# Patient Record
Sex: Female | Born: 1950
Health system: Southern US, Community
[De-identification: ages and names within clinical notes are randomized; demographics above are authoritative.]

## PROBLEM LIST (undated history)

## (undated) DIAGNOSIS — R112 Nausea with vomiting, unspecified: Secondary | ICD-10-CM

## (undated) DIAGNOSIS — T8859XA Other complications of anesthesia, initial encounter: Secondary | ICD-10-CM

## (undated) DIAGNOSIS — Z8489 Family history of other specified conditions: Secondary | ICD-10-CM

## (undated) DIAGNOSIS — F419 Anxiety disorder, unspecified: Secondary | ICD-10-CM

## (undated) DIAGNOSIS — K900 Celiac disease: Secondary | ICD-10-CM

## (undated) DIAGNOSIS — F40240 Claustrophobia: Secondary | ICD-10-CM

## (undated) DIAGNOSIS — E7800 Pure hypercholesterolemia, unspecified: Secondary | ICD-10-CM

## (undated) DIAGNOSIS — T4145XA Adverse effect of unspecified anesthetic, initial encounter: Secondary | ICD-10-CM

## (undated) DIAGNOSIS — E039 Hypothyroidism, unspecified: Secondary | ICD-10-CM

## (undated) DIAGNOSIS — G35 Multiple sclerosis: Secondary | ICD-10-CM

## (undated) DIAGNOSIS — G4730 Sleep apnea, unspecified: Secondary | ICD-10-CM

## (undated) DIAGNOSIS — G35D Multiple sclerosis, unspecified: Secondary | ICD-10-CM

## (undated) DIAGNOSIS — Z9889 Other specified postprocedural states: Secondary | ICD-10-CM

## (undated) DIAGNOSIS — N879 Dysplasia of cervix uteri, unspecified: Secondary | ICD-10-CM

## (undated) DIAGNOSIS — H9190 Unspecified hearing loss, unspecified ear: Secondary | ICD-10-CM

## (undated) DIAGNOSIS — F329 Major depressive disorder, single episode, unspecified: Secondary | ICD-10-CM

## (undated) DIAGNOSIS — K219 Gastro-esophageal reflux disease without esophagitis: Secondary | ICD-10-CM

## (undated) DIAGNOSIS — F32A Depression, unspecified: Secondary | ICD-10-CM

## (undated) DIAGNOSIS — H539 Unspecified visual disturbance: Secondary | ICD-10-CM

## (undated) HISTORY — DX: Unspecified hearing loss, unspecified ear: H91.90

## (undated) HISTORY — DX: Multiple sclerosis: G35

## (undated) HISTORY — PX: COLPOSCOPY: SHX161

## (undated) HISTORY — PX: ROTATOR CUFF REPAIR: SHX139

## (undated) HISTORY — PX: FINGER SURGERY: SHX640

## (undated) HISTORY — PX: COLONOSCOPY: SHX174

## (undated) HISTORY — PX: VITRECTOMY: SHX106

## (undated) HISTORY — PX: LAPAROSCOPY: SHX197

## (undated) HISTORY — PX: OOPHORECTOMY: SHX86

## (undated) HISTORY — DX: Unspecified visual disturbance: H53.9

## (undated) HISTORY — DX: Dysplasia of cervix uteri, unspecified: N87.9

## (undated) HISTORY — DX: Celiac disease: K90.0

## (undated) HISTORY — DX: Multiple sclerosis, unspecified: G35.D

## (undated) HISTORY — DX: Hypothyroidism, unspecified: E03.9

## (undated) HISTORY — PX: CHOLECYSTECTOMY: SHX55

---

## 1981-08-20 HISTORY — PX: CHOLECYSTECTOMY: SHX55

## 1984-08-20 HISTORY — PX: LAPAROSCOPY: SHX197

## 1985-08-20 HISTORY — PX: GYNECOLOGIC CRYOSURGERY: SHX857

## 1997-08-20 HISTORY — PX: ABDOMINAL HYSTERECTOMY: SHX81

## 1998-01-19 ENCOUNTER — Inpatient Hospital Stay (HOSPITAL_COMMUNITY): Admission: RE | Admit: 1998-01-19 | Discharge: 1998-01-21 | Payer: Self-pay | Admitting: Obstetrics and Gynecology

## 1998-11-02 ENCOUNTER — Other Ambulatory Visit: Admission: RE | Admit: 1998-11-02 | Discharge: 1998-11-02 | Payer: Self-pay | Admitting: Obstetrics and Gynecology

## 1998-12-07 ENCOUNTER — Emergency Department (HOSPITAL_COMMUNITY): Admission: EM | Admit: 1998-12-07 | Discharge: 1998-12-07 | Payer: Self-pay | Admitting: Emergency Medicine

## 1998-12-07 ENCOUNTER — Encounter: Payer: Self-pay | Admitting: Emergency Medicine

## 1999-09-30 ENCOUNTER — Emergency Department (HOSPITAL_COMMUNITY): Admission: EM | Admit: 1999-09-30 | Discharge: 1999-09-30 | Payer: Self-pay | Admitting: Emergency Medicine

## 1999-10-01 ENCOUNTER — Encounter: Payer: Self-pay | Admitting: Emergency Medicine

## 1999-12-07 ENCOUNTER — Other Ambulatory Visit: Admission: RE | Admit: 1999-12-07 | Discharge: 1999-12-07 | Payer: Self-pay | Admitting: Obstetrics and Gynecology

## 2001-01-03 ENCOUNTER — Other Ambulatory Visit: Admission: RE | Admit: 2001-01-03 | Discharge: 2001-01-03 | Payer: Self-pay | Admitting: Obstetrics and Gynecology

## 2001-12-10 ENCOUNTER — Other Ambulatory Visit: Admission: RE | Admit: 2001-12-10 | Discharge: 2001-12-10 | Payer: Self-pay | Admitting: Obstetrics and Gynecology

## 2002-12-16 ENCOUNTER — Other Ambulatory Visit: Admission: RE | Admit: 2002-12-16 | Discharge: 2002-12-16 | Payer: Self-pay | Admitting: Obstetrics and Gynecology

## 2003-12-20 ENCOUNTER — Other Ambulatory Visit: Admission: RE | Admit: 2003-12-20 | Discharge: 2003-12-20 | Payer: Self-pay | Admitting: Obstetrics and Gynecology

## 2004-04-26 ENCOUNTER — Encounter: Admission: RE | Admit: 2004-04-26 | Discharge: 2004-04-26 | Payer: Self-pay | Admitting: Obstetrics and Gynecology

## 2004-12-21 ENCOUNTER — Other Ambulatory Visit: Admission: RE | Admit: 2004-12-21 | Discharge: 2004-12-21 | Payer: Self-pay | Admitting: Obstetrics and Gynecology

## 2005-06-12 ENCOUNTER — Encounter: Admission: RE | Admit: 2005-06-12 | Discharge: 2005-06-12 | Payer: Self-pay | Admitting: Obstetrics and Gynecology

## 2006-01-17 ENCOUNTER — Other Ambulatory Visit: Admission: RE | Admit: 2006-01-17 | Discharge: 2006-01-17 | Payer: Self-pay | Admitting: Obstetrics and Gynecology

## 2007-01-29 ENCOUNTER — Other Ambulatory Visit: Admission: RE | Admit: 2007-01-29 | Discharge: 2007-01-29 | Payer: Self-pay | Admitting: Obstetrics and Gynecology

## 2007-11-04 ENCOUNTER — Ambulatory Visit (HOSPITAL_BASED_OUTPATIENT_CLINIC_OR_DEPARTMENT_OTHER): Admission: RE | Admit: 2007-11-04 | Discharge: 2007-11-04 | Payer: Self-pay | Admitting: Orthopaedic Surgery

## 2010-06-04 ENCOUNTER — Ambulatory Visit: Payer: Self-pay | Admitting: Diagnostic Radiology

## 2010-06-04 ENCOUNTER — Emergency Department (HOSPITAL_BASED_OUTPATIENT_CLINIC_OR_DEPARTMENT_OTHER): Admission: EM | Admit: 2010-06-04 | Discharge: 2010-06-04 | Payer: Self-pay | Admitting: Emergency Medicine

## 2010-08-18 ENCOUNTER — Ambulatory Visit: Payer: Self-pay | Admitting: Gynecology

## 2010-10-05 ENCOUNTER — Other Ambulatory Visit: Payer: Self-pay | Admitting: Obstetrics and Gynecology

## 2010-10-05 ENCOUNTER — Other Ambulatory Visit (HOSPITAL_COMMUNITY)
Admission: RE | Admit: 2010-10-05 | Discharge: 2010-10-05 | Disposition: A | Discharge status: Home / Self Care | Payer: Medicare Other | Source: Ambulatory Visit | Attending: Obstetrics and Gynecology | Admitting: Obstetrics and Gynecology

## 2010-10-05 ENCOUNTER — Encounter (INDEPENDENT_AMBULATORY_CARE_PROVIDER_SITE_OTHER): Payer: Medicare Other | Admitting: Obstetrics and Gynecology

## 2010-10-05 DIAGNOSIS — R350 Frequency of micturition: Secondary | ICD-10-CM

## 2010-10-05 DIAGNOSIS — Z124 Encounter for screening for malignant neoplasm of cervix: Secondary | ICD-10-CM

## 2010-10-05 DIAGNOSIS — N952 Postmenopausal atrophic vaginitis: Secondary | ICD-10-CM

## 2010-10-05 DIAGNOSIS — R82998 Other abnormal findings in urine: Secondary | ICD-10-CM

## 2010-10-05 DIAGNOSIS — N951 Menopausal and female climacteric states: Secondary | ICD-10-CM

## 2010-11-26 ENCOUNTER — Inpatient Hospital Stay (INDEPENDENT_AMBULATORY_CARE_PROVIDER_SITE_OTHER)
Admission: RE | Admit: 2010-11-26 | Discharge: 2010-11-26 | Disposition: A | Discharge status: Home / Self Care | Payer: Medicare Other | Source: Ambulatory Visit | Attending: Emergency Medicine | Admitting: Emergency Medicine

## 2010-11-26 DIAGNOSIS — H6120 Impacted cerumen, unspecified ear: Secondary | ICD-10-CM

## 2010-11-26 DIAGNOSIS — J029 Acute pharyngitis, unspecified: Secondary | ICD-10-CM

## 2010-11-26 LAB — POCT RAPID STREP A (OFFICE): Streptococcus, Group A Screen (Direct): NEGATIVE

## 2011-01-02 NOTE — Op Note (Signed)
NAMESHADAVIA, DAMPIER              ACCOUNT NO.:  [id number]   MEDICAL RECORD NO.:  [id number]          PATIENT TYPE:  AMB   LOCATION:  [facility]                          FACILITY:  [facility]   PHYSICIAN:  Monico Blitz. Dalldorf, M.D.DATE OF BIRTH:  [birth year]   DATE OF PROCEDURE:  11/04/2007  DATE OF DISCHARGE:                               OPERATIVE REPORT   PREOPERATIVE DIAGNOSES:  1. Right shoulder impingement.  2. Right shoulder partial rotator cuff tear.  3. Right shoulder SLAP.   POSTOPERATIVE DIAGNOSES:  1. Right shoulder impingement.  2. Right shoulder partial rotator cuff tear.  3. Right shoulder SLAP.   PROCEDURE:  1. Right shoulder arthroscopic acromioplasty.  2. Right shoulder arthroscopic partial claviculectomy.  3. Right shoulder arthroscopic debridement.   ANESTHESIA:  General and block.   ATTENDING SURGEON:  Monico Blitz. Rhona Raider, M.D.   ASSISTANT:  Roselee Nova, P.A.   INDICATIONS FOR PROCEDURE:  The patient is a 60 year old woman with many  months of right shoulder pain.  She did respond in a transient fashion  to a subacromial injection, with 2 weeks of relief.  She has not  responded to an exercise program and has been left with pain at rest and  pain trying to use her arm.  By MRI scan she has a significant partial-  thickness tear and things consistent with impingement.  She also appears  to have a small SLAP injury.  She is offered an arthroscopy.  Informed  operative consent was obtained, after the discussion of the possible  complications of reaction to anesthesia and infection.   SUMMARY FINDINGS AND PROCEDURE:  Under general anesthesia and a block,  an arthroscopy of the right shoulder was performed.  The glenohumeral  joint showed no degenerative changes, and the biceps tendon appeared  intact.  Labral structures were intact, except for a slight injury to  the superior labrum.  This was technically a type 2, but the labrum was  not subluxed into the joint.   I elected to perform a debridement of the  superior aspect of the glenoid without any suturing.  She did have a  partial-thickness rotator cuff tear, seen from below.  A debridement was  done.  In the subacromial space she had bursitis and a thorough  bursectomy that was done, but no full-thickness tear was seen.  She had  a prominent subacromial morphology, addressed with an acromioplasty; and  also had a prominence of the distal clavicle, addressed with a partial  claviculectomy.   DESCRIPTION OF PROCEDURE:  The patient went to the operating suite,  where a general anesthetic was applied without difficulty.  She was also  given a block in the preanesthesia area.  She was positioned in beach-  chair position and prepped and draped in normal sterile fashion.  After  the administration of IV Kefzol, an arthroscopy to the right shoulder  was performed through a total of 3 portals.  Findings were as noted  above.  The procedure consisted of the debridement of the flap, with  abrasion of the superior aspect of the glenoid.  We then followed  this  with a debridement of the synovial surface partial-thickness cuff tear.  This did not appear to constitute more than about 10 or 20% of the cuff.  In the subacromial space we performed an acromioplasty, with the bur in  the lateral position; followed by transfer of the bur to the posterior  position.  I also performed a resection of the undersurface of the  distal clavicle.  The cuff was thoroughly inspected from above, and no  significant tear was seen here.  The shoulder was thoroughly irrigated,  followed by placement of Marcaine.  Simple sutures of nylon was used to  loosely reapproximate the portals, followed by dry gauze and tape.  Estimated blood loss and fluids obtained from anesthesia records.   DISPOSITION:  The patient was extubated in the operating room and then  taken to the recovery room in stable condition.   PLANS:  Plans are for  her to go home the same day and follow up in the  office in one week.  We will monitor in the recovery room, and if we  need to keep her overnight that will certainly be fine.  If she does go  home, I will contact her by phone tonight.      Monico Blitz Rhona Raider, M.D.  Electronically Signed     PGD/MEDQ  D:  11/04/2007  T:  11/04/2007  Job:  [id number]

## 2011-05-04 ENCOUNTER — Emergency Department (HOSPITAL_COMMUNITY)
Admission: EM | Admit: 2011-05-04 | Discharge: 2011-05-04 | Disposition: A | Discharge status: Home / Self Care | Payer: Medicare Other | Attending: Emergency Medicine | Admitting: Emergency Medicine

## 2011-05-04 ENCOUNTER — Emergency Department (HOSPITAL_COMMUNITY): Payer: Medicare Other

## 2011-05-04 DIAGNOSIS — R05 Cough: Secondary | ICD-10-CM | POA: Insufficient documentation

## 2011-05-04 DIAGNOSIS — R059 Cough, unspecified: Secondary | ICD-10-CM | POA: Insufficient documentation

## 2011-05-04 DIAGNOSIS — R6883 Chills (without fever): Secondary | ICD-10-CM | POA: Insufficient documentation

## 2011-05-04 DIAGNOSIS — Z7982 Long term (current) use of aspirin: Secondary | ICD-10-CM | POA: Insufficient documentation

## 2011-05-04 DIAGNOSIS — E039 Hypothyroidism, unspecified: Secondary | ICD-10-CM | POA: Insufficient documentation

## 2011-05-04 DIAGNOSIS — R1013 Epigastric pain: Secondary | ICD-10-CM | POA: Insufficient documentation

## 2011-05-04 DIAGNOSIS — Z79899 Other long term (current) drug therapy: Secondary | ICD-10-CM | POA: Insufficient documentation

## 2011-05-04 DIAGNOSIS — N390 Urinary tract infection, site not specified: Secondary | ICD-10-CM | POA: Insufficient documentation

## 2011-05-04 DIAGNOSIS — G35 Multiple sclerosis: Secondary | ICD-10-CM | POA: Insufficient documentation

## 2011-05-04 DIAGNOSIS — E7800 Pure hypercholesterolemia, unspecified: Secondary | ICD-10-CM | POA: Insufficient documentation

## 2011-05-04 DIAGNOSIS — F341 Dysthymic disorder: Secondary | ICD-10-CM | POA: Insufficient documentation

## 2011-05-04 LAB — URINALYSIS, ROUTINE W REFLEX MICROSCOPIC
Glucose, UA: NEGATIVE mg/dL
Protein, ur: NEGATIVE mg/dL
pH: 5.5 (ref 5.0–8.0)

## 2011-05-04 LAB — COMPREHENSIVE METABOLIC PANEL
ALT: 31 U/L (ref 0–35)
AST: 18 U/L (ref 0–37)
Albumin: 3.9 g/dL (ref 3.5–5.2)
CO2: 28 mEq/L (ref 19–32)
Calcium: 9.2 mg/dL (ref 8.4–10.5)
Chloride: 100 mEq/L (ref 96–112)
GFR calc non Af Amer: >60 mL/min (ref >60)
Sodium: 136 mEq/L (ref 135–145)

## 2011-05-04 LAB — DIFFERENTIAL
Lymphocytes Relative: 35 % (ref 12–46)
Lymphs Abs: 2 10*3/uL (ref 0.7–4.0)
Monocytes Relative: 10 % (ref 3–12)
Neutrophils Relative %: 54 % (ref 43–77)

## 2011-05-04 LAB — URINE MICROSCOPIC-ADD ON

## 2011-05-04 LAB — CBC
HCT: 37.3 % (ref 36.0–46.0)
Hemoglobin: 12.9 g/dL (ref 12.0–15.0)
MCH: 29.1 pg (ref 26.0–34.0)
MCV: 84.2 fL (ref 78.0–100.0)
Platelets: 265 10*3/uL (ref 150–400)
RBC: 4.43 MIL/uL (ref 3.87–5.11)
WBC: 5.8 10*3/uL (ref 4.0–10.5)

## 2011-05-04 LAB — CK TOTAL AND CKMB (NOT AT ARMC): Relative Index: INVALID (ref 0.0–2.5)

## 2011-05-11 ENCOUNTER — Other Ambulatory Visit: Payer: Self-pay | Admitting: Gastroenterology

## 2011-06-11 ENCOUNTER — Encounter: Payer: Self-pay | Admitting: Gynecology

## 2011-06-11 ENCOUNTER — Ambulatory Visit (INDEPENDENT_AMBULATORY_CARE_PROVIDER_SITE_OTHER): Payer: Medicare Other | Admitting: Gynecology

## 2011-06-11 VITALS — BP 120/80

## 2011-06-11 DIAGNOSIS — G35 Multiple sclerosis: Secondary | ICD-10-CM | POA: Insufficient documentation

## 2011-06-11 DIAGNOSIS — E039 Hypothyroidism, unspecified: Secondary | ICD-10-CM | POA: Insufficient documentation

## 2011-06-11 DIAGNOSIS — B373 Candidiasis of vulva and vagina: Secondary | ICD-10-CM

## 2011-06-11 DIAGNOSIS — M549 Dorsalgia, unspecified: Secondary | ICD-10-CM

## 2011-06-11 DIAGNOSIS — N898 Other specified noninflammatory disorders of vagina: Secondary | ICD-10-CM

## 2011-06-11 DIAGNOSIS — R102 Pelvic and perineal pain: Secondary | ICD-10-CM

## 2011-06-11 DIAGNOSIS — N949 Unspecified condition associated with female genital organs and menstrual cycle: Secondary | ICD-10-CM

## 2011-06-11 DIAGNOSIS — N809 Endometriosis, unspecified: Secondary | ICD-10-CM | POA: Insufficient documentation

## 2011-06-11 MED ORDER — FLUCONAZOLE 150 MG PO TABS: 150.0000 mg | ORAL_TABLET | Freq: Once | ORAL | Status: AC

## 2011-06-11 MED ORDER — CYCLOBENZAPRINE HCL 10 MG PO TABS: 10.0000 mg | ORAL_TABLET | Freq: Three times a day (TID) | ORAL | Status: DC | PRN

## 2011-06-11 NOTE — Progress Notes (Signed)
Patient presents with 2 issues. First is a vaginal discharge with odor notes that over the last week or so. Second issue is some right lower quadrant discomfort first started in the back and radiated around to the front and now has some radiation down her leg. No nausea vomiting diarrhea constipation no urinary symptoms such as frequency dysuria blood or abnormal urine color. No constipation diarrhea. She just recently 5 weeks ago had an endoscopy colonoscopy which was reportedly normal. She is awaiting a call from a urine culture that was done per Dr. Harley Hallmark office in follow up of being treated for UTI previously. She is status post TAH/BSO number of years ago for endometriosis pelvic pain has not been on ERT.  Exam Spine straight without CVA tenderness Abdomen soft nontender without masses guarding rebound organomegaly Pelvic external BUS vagina with moderate atrophic changes white discharge noted KOH wet prep done, bimanual without masses or tenderness, rectovaginal exam is normal  Assessment and plan 1. White discharge KOH wet prep is positive for yeast. She has been on antibiotics for UTI recently we'll cover with Diflucan 150x1 dose. 2. Right-sided pelvic pain. Differential to include GI, urinary musculoskeletal, disc reviewed. Given her TAH/BSO history and not having any issues up until now I doubt GYN related. We'll start with ultrasound just to clear pelvis assuming negative, since she had a recent colonoscopy not showing evidence of diverticulitis or other pathology, I would recommend following up with orthopedic rule out low back disease such as disc. If her urine culture is positive and she is treated with antibiotics and her symptoms resolved we will follow. I did prescribe Flexeril 10 mg #20 one by mouth at bedtime up to every 8 hours to see if this doesn't help or if it is musculoskeletal. Patient will follow up for her ultrasound and we'll go from there.

## 2011-06-18 ENCOUNTER — Encounter: Payer: Self-pay | Admitting: Gynecology

## 2011-06-18 ENCOUNTER — Ambulatory Visit (INDEPENDENT_AMBULATORY_CARE_PROVIDER_SITE_OTHER): Payer: Medicare Other

## 2011-06-18 ENCOUNTER — Ambulatory Visit (INDEPENDENT_AMBULATORY_CARE_PROVIDER_SITE_OTHER): Payer: Medicare Other | Admitting: Gynecology

## 2011-06-18 DIAGNOSIS — R102 Pelvic and perineal pain: Secondary | ICD-10-CM

## 2011-06-18 DIAGNOSIS — R1031 Right lower quadrant pain: Secondary | ICD-10-CM

## 2011-06-18 DIAGNOSIS — N949 Unspecified condition associated with female genital organs and menstrual cycle: Secondary | ICD-10-CM

## 2011-06-18 DIAGNOSIS — N898 Other specified noninflammatory disorders of vagina: Secondary | ICD-10-CM

## 2011-06-18 MED ORDER — TERCONAZOLE 0.8 % VA CREA: 1.0000 | TOPICAL_CREAM | Freq: Every day | VAGINAL | Status: AC

## 2011-06-18 NOTE — Progress Notes (Signed)
Patient also for ultrasound due to right sided pain. Ultrasound is negative with the exception of some prominent bowel activity. Review this with her husband. As discussed with her prior appointment I think this sounds more orthopedic although she never tried the Flexeril concurs to go ahead and try this to see if it does relieve her discomfort. She has an appointment with Dr. Cristina Gong in follow up of her colonoscopy that was done a month ago. I asked her to review her symptoms with him to see if he would think anything further from a study standpoint needs to be done but otherwise she can follow up with orthopedics. She does note that her vaginal symptoms seem to gotten better with the single Diflucan but still having some odor and a little bit of discharge. Recommended Terazol 3 day cream I prescribed this for her. Follow up if symptoms persist or recur.

## 2011-06-22 ENCOUNTER — Inpatient Hospital Stay (INDEPENDENT_AMBULATORY_CARE_PROVIDER_SITE_OTHER)
Admission: RE | Admit: 2011-06-22 | Discharge: 2011-06-22 | Disposition: A | Discharge status: Home / Self Care | Payer: Medicare Other | Source: Ambulatory Visit | Attending: Family Medicine | Admitting: Family Medicine

## 2011-06-22 DIAGNOSIS — K649 Unspecified hemorrhoids: Secondary | ICD-10-CM

## 2011-06-26 ENCOUNTER — Other Ambulatory Visit: Payer: Self-pay | Admitting: Gastroenterology

## 2011-06-26 ENCOUNTER — Encounter: Payer: Self-pay | Admitting: Obstetrics and Gynecology

## 2011-06-26 DIAGNOSIS — R1031 Right lower quadrant pain: Secondary | ICD-10-CM

## 2011-06-29 ENCOUNTER — Ambulatory Visit
Admission: RE | Admit: 2011-06-29 | Discharge: 2011-06-29 | Disposition: A | Discharge status: Home / Self Care | Payer: Medicare Other | Source: Ambulatory Visit | Attending: Gastroenterology | Admitting: Gastroenterology

## 2011-06-29 DIAGNOSIS — R1031 Right lower quadrant pain: Secondary | ICD-10-CM

## 2011-06-29 MED ORDER — IOHEXOL 300 MG/ML  SOLN
100.0000 mL | Freq: Once | INTRAMUSCULAR | Status: AC | PRN
Start: 2011-06-29 — End: 2011-06-29
  Administered 2011-06-29: 100 mL via INTRAVENOUS

## 2011-10-11 DIAGNOSIS — R209 Unspecified disturbances of skin sensation: Secondary | ICD-10-CM | POA: Diagnosis not present

## 2011-10-11 DIAGNOSIS — Z79899 Other long term (current) drug therapy: Secondary | ICD-10-CM | POA: Diagnosis not present

## 2011-10-11 DIAGNOSIS — G35 Multiple sclerosis: Secondary | ICD-10-CM | POA: Diagnosis not present

## 2011-10-11 DIAGNOSIS — G4700 Insomnia, unspecified: Secondary | ICD-10-CM | POA: Diagnosis not present

## 2011-10-15 DIAGNOSIS — Z111 Encounter for screening for respiratory tuberculosis: Secondary | ICD-10-CM | POA: Diagnosis not present

## 2011-10-30 ENCOUNTER — Encounter: Payer: Self-pay | Admitting: Obstetrics and Gynecology

## 2011-10-30 ENCOUNTER — Ambulatory Visit (INDEPENDENT_AMBULATORY_CARE_PROVIDER_SITE_OTHER): Payer: Medicare Other | Admitting: Obstetrics and Gynecology

## 2011-10-30 VITALS — BP 130/72 | Ht 63.0 in | Wt 188.0 lb

## 2011-10-30 DIAGNOSIS — N390 Urinary tract infection, site not specified: Secondary | ICD-10-CM | POA: Diagnosis not present

## 2011-10-30 DIAGNOSIS — N879 Dysplasia of cervix uteri, unspecified: Secondary | ICD-10-CM | POA: Insufficient documentation

## 2011-10-30 DIAGNOSIS — K900 Celiac disease: Secondary | ICD-10-CM | POA: Insufficient documentation

## 2011-10-30 NOTE — Progress Notes (Addendum)
The patient came back today for further followup. She continues to have loss of urine with coughing laughing and sneezing. She has done Kegel exercises. The neurologist who takes care of her multiple sclerosis thought was related to that. She does not have dysuria, frequency, urgency, or hematuria. Her hot flashes have significantly subsided. She has atrophic vaginitis but is asymptomatic. She is not sexually active. She is having no vaginal bleeding. She is having no pelvic pain. She has scheduled her  mammogram for Friday. She does her lab through PCP. She has had 2 normal bone densities. She is status post TAH/BSO for endometriosis.  ROS: 12 system  review done. Patient has multiple sclerosis with current symptoms blurred vision normal optic neuritis and paresthesias of left arm. Patient's neurologist is switching her medication. Pertinent positives listed above. Other positives include celiac disease.  HEENT: Within normal limits. Leslie Ryan present Neck: No masses. Supraclavicular lymph nodes: Not enlarged. Breasts: Examined in both sitting and lying position. Symmetrical without skin changes or masses. Abdomen: Soft no masses guarding or rebound. No hernias. Pelvic: External within normal limits. BUS within normal limits. Vaginal examination shows poor estrogen effect, no cystocele enterocele or rectocele. Cervix and uterus absent. Adnexa within normal limits. Rectovaginal confirmatory.  Assessment: Urinary incontinence-stress versus neurological bladder. Atrophic vaginitis. Menopausal symptoms. Multiple sclerosis.  Plan: Discussed urodynamics with the urologist to see if this is stress incontinence rather than neurological bladder. Patient declined at the moment as she would not consider surgery and is not that much of a problem. Mammogram. No treatment necessary for menopausal symptoms. I told the patient since she is status post TAH/BSO, on no medication that I give her, more than 20 years  since dysplasia of the cervix that I did not think she needs to see me regularly wellness exams. She will continue with yearly mammograms. She will return whenever she has a problem. Extremities within normal limits.

## 2011-10-31 LAB — URINALYSIS W MICROSCOPIC + REFLEX CULTURE
Bilirubin Urine: NEGATIVE
Casts: NONE SEEN
Crystals: NONE SEEN
Glucose, UA: NEGATIVE mg/dL
Leukocytes, UA: NEGATIVE
Specific Gravity, Urine: 1.011 (ref 1.005–1.030)
Squamous Epithelial / LPF: NONE SEEN
pH: 6 (ref 5.0–8.0)

## 2011-11-02 DIAGNOSIS — Z1231 Encounter for screening mammogram for malignant neoplasm of breast: Secondary | ICD-10-CM | POA: Diagnosis not present

## 2011-11-05 ENCOUNTER — Encounter: Payer: Self-pay | Admitting: Obstetrics and Gynecology

## 2011-12-06 DIAGNOSIS — H519 Unspecified disorder of binocular movement: Secondary | ICD-10-CM | POA: Diagnosis not present

## 2011-12-06 DIAGNOSIS — H538 Other visual disturbances: Secondary | ICD-10-CM | POA: Diagnosis not present

## 2011-12-06 DIAGNOSIS — H5200 Hypermetropia, unspecified eye: Secondary | ICD-10-CM | POA: Diagnosis not present

## 2011-12-13 DIAGNOSIS — E785 Hyperlipidemia, unspecified: Secondary | ICD-10-CM | POA: Diagnosis not present

## 2011-12-13 DIAGNOSIS — Z0000 Encounter for general adult medical examination without abnormal findings: Secondary | ICD-10-CM | POA: Diagnosis not present

## 2011-12-13 DIAGNOSIS — Z23 Encounter for immunization: Secondary | ICD-10-CM | POA: Diagnosis not present

## 2011-12-13 DIAGNOSIS — E049 Nontoxic goiter, unspecified: Secondary | ICD-10-CM | POA: Diagnosis not present

## 2011-12-13 DIAGNOSIS — G35 Multiple sclerosis: Secondary | ICD-10-CM | POA: Diagnosis not present

## 2011-12-17 ENCOUNTER — Emergency Department (HOSPITAL_COMMUNITY)
Admission: EM | Admit: 2011-12-17 | Discharge: 2011-12-17 | Disposition: A | Discharge status: Home / Self Care | Payer: Medicare Other | Attending: Emergency Medicine | Admitting: Emergency Medicine

## 2011-12-17 ENCOUNTER — Emergency Department (HOSPITAL_COMMUNITY): Payer: Medicare Other

## 2011-12-17 ENCOUNTER — Encounter (HOSPITAL_COMMUNITY): Payer: Self-pay | Admitting: Neurology

## 2011-12-17 DIAGNOSIS — Z7982 Long term (current) use of aspirin: Secondary | ICD-10-CM | POA: Insufficient documentation

## 2011-12-17 DIAGNOSIS — R209 Unspecified disturbances of skin sensation: Secondary | ICD-10-CM | POA: Diagnosis not present

## 2011-12-17 DIAGNOSIS — Z79899 Other long term (current) drug therapy: Secondary | ICD-10-CM | POA: Insufficient documentation

## 2011-12-17 DIAGNOSIS — R197 Diarrhea, unspecified: Secondary | ICD-10-CM | POA: Insufficient documentation

## 2011-12-17 DIAGNOSIS — G35 Multiple sclerosis: Secondary | ICD-10-CM | POA: Insufficient documentation

## 2011-12-17 DIAGNOSIS — R6889 Other general symptoms and signs: Secondary | ICD-10-CM | POA: Diagnosis not present

## 2011-12-17 DIAGNOSIS — R42 Dizziness and giddiness: Secondary | ICD-10-CM | POA: Diagnosis not present

## 2011-12-17 DIAGNOSIS — G319 Degenerative disease of nervous system, unspecified: Secondary | ICD-10-CM | POA: Diagnosis not present

## 2011-12-17 DIAGNOSIS — K900 Celiac disease: Secondary | ICD-10-CM | POA: Insufficient documentation

## 2011-12-17 DIAGNOSIS — R51 Headache: Secondary | ICD-10-CM | POA: Insufficient documentation

## 2011-12-17 DIAGNOSIS — E039 Hypothyroidism, unspecified: Secondary | ICD-10-CM | POA: Diagnosis not present

## 2011-12-17 LAB — POCT I-STAT, CHEM 8
BUN: 11 mg/dL (ref 6–23)
Chloride: 104 mEq/L (ref 96–112)
Creatinine, Ser: 0.7 mg/dL (ref 0.50–1.10)
Potassium: 4.1 mEq/L (ref 3.5–5.1)
Sodium: 139 mEq/L (ref 135–145)

## 2011-12-17 MED ORDER — IBUPROFEN 800 MG PO TABS
800.0000 mg | ORAL_TABLET | Freq: Once | ORAL | Status: AC
  Administered 2011-12-17: 800 mg via ORAL
  Filled 2011-12-17: qty 1

## 2011-12-17 MED ORDER — GADOBENATE DIMEGLUMINE 529 MG/ML IV SOLN
20.0000 mL | Freq: Once | INTRAVENOUS | Status: AC | PRN
  Administered 2011-12-17: 20 mL via INTRAVENOUS

## 2011-12-17 MED ORDER — ONDANSETRON HCL 4 MG/2ML IJ SOLN
INTRAMUSCULAR | Status: AC
  Filled 2011-12-17: qty 2

## 2011-12-17 MED ORDER — LORAZEPAM 2 MG/ML IJ SOLN
1.0000 mg | Freq: Once | INTRAMUSCULAR | Status: AC
  Administered 2011-12-17: 1 mg via INTRAVENOUS
  Filled 2011-12-17: qty 1

## 2011-12-17 NOTE — Discharge Instructions (Signed)
Call Dr Garth Bigness office in the morning as we discussed. Take the MRI disc with you to your visit. Return to the ER for further evaluation if you have any new or worsening symptoms.     Dizziness Dizziness is a common problem. It is a feeling of unsteadiness or lightheadedness. You may feel like you are about to faint. Dizziness can lead to injury if you stumble or fall. A person of any age group can suffer from dizziness, but dizziness is more common in older adults. CAUSES  Dizziness can be caused by many different things, including:  Middle ear problems.   Standing for too long.   Infections.   An allergic reaction.   Aging.   An emotional response to something, such as the sight of blood.   Side effects of medicines.   Fatigue.   Problems with circulation or blood pressure.   Excess use of alcohol, medicines, or illegal drug use.   Breathing too fast (hyperventilation).   An arrhythmia or problems with your heart rhythm.   Low red blood cell count (anemia).   Pregnancy.   Vomiting, diarrhea, fever, or other illnesses that cause dehydration.   Diseases or conditions such as Parkinson's disease, high blood pressure (hypertension), diabetes, and thyroid problems.   Exposure to extreme heat.  DIAGNOSIS  To find the cause of your dizziness, your caregiver may do a physical exam, lab tests, radiologic imaging scans, or an electrocardiography test (ECG).  TREATMENT  Treatment of dizziness depends on the cause of your symptoms and can vary greatly. HOME CARE INSTRUCTIONS   Drink enough fluids to keep your urine clear or pale yellow. This is especially important in very hot weather. In the elderly, it is also important in cold weather.   If your dizziness is caused by medicines, take them exactly as directed. When taking blood pressure medicines, it is especially important to get up slowly.   Rise slowly from chairs and steady yourself until you feel okay.   In the  morning, first sit up on the side of the bed. When this seems okay, stand slowly while holding onto something until you know your balance is fine.   If you need to stand in one place for a long time, be sure to move your legs often. Tighten and relax the muscles in your legs while standing.   If dizziness continues to be a problem, have someone stay with you for a day or two. Do this until you feel you are well enough to stay alone. Have the person call your caregiver if he or she notices changes in you that are concerning.   Do not drive or use heavy machinery if you feel dizzy.  SEEK IMMEDIATE MEDICAL CARE IF:   Your dizziness or lightheadedness gets worse.   You feel nauseous or vomit.   You develop problems with talking, walking, weakness, or using your arms, hands, or legs.   You are not thinking clearly or you have difficulty forming sentences. It may take a friend or family member to determine if your thinking is normal.   You develop chest pain, abdominal pain, shortness of breath, or sweating.   Your vision changes.   You notice any bleeding.   You have side effects from medicine that seems to be getting worse rather than better.  MAKE SURE YOU:   Understand these instructions.   Will watch your condition.   Will get help right away if you are not doing well or  get worse.  Document Released: 01/30/2001 Document Revised: 07/26/2011 Document Reviewed: 02/23/2011 [facility] Patient Information 2012 [place].

## 2011-12-17 NOTE — ED Provider Notes (Signed)
Pt with hx MS and internuclear ophthalmoplegia, to CDU to await completion of MRI study of the brain. Pt care shared with Dr Winfred Leeds. Please see his note for full HPI and initial physical examination. On arrival to CDU, pt c/o mild right-sided HA. Continued subjective left hand numbness, very slight dizziness. Denies visual change from baseline at this time.  Nausea has resolved.    On exam, pt is awake, alert, oriented, NAD. PERRL. Visual fields full to confrontation bilaterally. EOMI with no nystagmus. Lungs CTAB. Heart RRR. Abd soft/NT/ND. Nml bowel sounds. MAEW with 5/5 bilateral grip strength. Ambulatory with steady gait in ED.    MRI results reviewed, no acute findings. This has been discussed with the patient. I spoke with the on-call neurologist for pt primary neurologist, Dr Felecia Shelling in [facility], who advises that they can get the patient in to the office tomorrow for close follow-up. She will bring a disc of her MRI images to the appointment. Pt is comfortable with this follow-up plan and feels ready for d/c home. Return precautions were discussed.  Orlie Dakin, [place] 12/17/11 1843

## 2011-12-17 NOTE — ED Provider Notes (Signed)
Medical screening examination/treatment/procedure(s) were conducted as a shared visit with non-physician practitioner(s) and myself.  I personally evaluated the patient during the encounter  Orlie Dakin, MD 12/17/11 2140

## 2011-12-17 NOTE — ED Notes (Signed)
Pt to be given 1 mg ativan before MRI due to anxiety.

## 2011-12-17 NOTE — ED Provider Notes (Signed)
History     CSN: [id number]  Arrival date & time 12/17/11  1406   First MD Initiated Contact with Patient 12/17/11 1429      Chief Complaint  Patient presents with  . Dizziness    (Consider location/radiation/quality/duration/timing/severity/associated sxs/prior treatment) HPI Complaint of dizziness, sudden onset 12:15 PM today feeling of lightheadedness and room spinning accompanied by numbness in left hand symptoms much improved since arrival here no treatment prior to coming here. Patient also had one episode of diarrhea. She denies any abdominal pain denies chest pain denies shortness of breath. Admits to left-sided temporal headaches intermittent over the past 2 weeks. Has mild headache presently.. patient had similar episodes of "dizziness" in the past when she had internuclear ophthalmoplegia which was the first exacerbation of her multiple sclerosis Past Medical History  Diagnosis Date  . Multiple sclerosis   . Endometriosis   . Hypothyroidism   . Cervical dysplasia   . Celiac disease     Past Surgical History  Procedure Date  . Gynecologic cryosurgery 1987  . Cholecystectomy   . Rotator cuff repair   . Finger surgery   . Abdominal hysterectomy 1999    TAH,BSO menorrhgia,,endometriosis  . Colposcopy   . Oophorectomy     Family History  Problem Relation Age of Onset  . Breast cancer Mother     age 58's  . Cancer Mother     UTERINE  . Diabetes Father   . Hypertension Father   . Heart disease Father   . Cancer Maternal Grandfather     Stomach cancer  . Heart disease Paternal Grandfather     History  Substance Use Topics  . Smoking status: Former Research scientist (life sciences)  . Smokeless tobacco: Not on file  . Alcohol Use: No    OB History    Grav Para Term Preterm Abortions TAB SAB Ect Mult Living   0               Review of Systems  Constitutional: Negative.   Respiratory: Negative.   Cardiovascular: Negative.   Gastrointestinal: Positive for diarrhea.    Musculoskeletal: Negative.   Skin: Negative.   Neurological: Positive for dizziness, numbness and headaches.  Hematological: Negative.   Psychiatric/Behavioral: Negative.     Allergies  Erythromycin  Home Medications   Current Outpatient Rx  Name Route Sig Dispense Refill  . ASPIRIN 81 MG PO TABS Oral Take 81 mg by mouth daily.    Marland Kitchen VITAMIN D-3 5000 UNITS PO TABS Oral Take 1 tablet by mouth daily.     Marland Kitchen FLUOXETINE HCL 20 MG PO CAPS Oral Take 20 mg by mouth daily.      . INTERFERON BETA-1A 30 MCG/0.5ML IM KIT Intramuscular Inject 30 mcg into the muscle every 7 (seven) days.      Marland Kitchen LANSOPRAZOLE 30 MG PO CPDR Oral Take 30 mg by mouth daily.      Marland Kitchen LEVOTHYROXINE SODIUM 112 MCG PO TABS Oral Take 112 mcg by mouth daily.      Marland Kitchen PRAVASTATIN SODIUM 40 MG PO TABS Oral Take 40 mg by mouth daily.      Marland Kitchen VITAMIN E 400 UNITS PO CAPS Oral Take 400 Units by mouth daily.        BP 126/67  Pulse 83  Temp(Src) 98.1 F (36.7 C) (Oral)  Resp 10  SpO2 96%  LMP 08/20/1997  Physical Exam  Nursing note and vitals reviewed. Constitutional: She appears well-developed and well-nourished.  HENT:  Head: Normocephalic and  atraumatic.  Eyes: Conjunctivae are normal. Pupils are equal, round, and reactive to light.  Neck: Neck supple. No tracheal deviation present. No thyromegaly present.  Cardiovascular: Normal rate and regular rhythm.   No murmur heard. Pulmonary/Chest: Effort normal and breath sounds normal.  Abdominal: Soft. Bowel sounds are normal. She exhibits no distension. There is no tenderness.  Musculoskeletal: Normal range of motion. She exhibits no edema and no tenderness.       Gait normal  Neurological: She is alert. Coordination normal.       Become slightly vertiginous upon sitting up from supine position Romberg normal pronator drift normal,  Skin: Skin is warm and dry. No rash noted.  Psychiatric: She has a normal mood and affect.    Date: 12/17/2011  Rate: 75  Rhythm: normal  sinus rhythm  QRS Axis: normal  Intervals: normal  ST/T Wave abnormalities: normal  Conduction Disutrbances: none  Narrative Interpretation: unremarkable  Unchanged from 05/04/11  ED Course  Procedures (including critical care time)  Labs Reviewed - No data to display No results found.   No diagnosis found.   Results for orders placed during the hospital encounter of 12/17/11  POCT I-STAT, CHEM 8      Component Value Range   Sodium 139  135 - 145 (mEq/L)   Potassium 4.1  3.5 - 5.1 (mEq/L)   Chloride 104  96 - 112 (mEq/L)   BUN 11  6 - 23 (mg/dL)   Creatinine, Ser 0.70  0.50 - 1.10 (mg/dL)   Glucose, Bld 112 (*) 70 - 99 (mg/dL)   Calcium, Ion 1.17  1.12 - 1.32 (mmol/L)   TCO2 26  0 - 100 (mmol/L)   Hemoglobin 13.9  12.0 - 15.0 (g/dL)   HCT 41.0  36.0 - 46.0 (%)   Mr Jeri Cos Wo Contrast  12/17/2011  *RADIOLOGY REPORT*  Clinical Data: Also a history multiple sclerosis.  Acute onset of dizziness.  MRI HEAD WITHOUT AND WITH CONTRAST  Technique:  Multiplanar, multiecho pulse sequences of the brain and surrounding structures were obtained according to standard protocol without and with intravenous contrast  Contrast: 22m MULTIHANCE GADOBENATE DIMEGLUMINE 529 MG/ML IV SOLN  Comparison: None.  Findings: No acute infarct seen separate from the areas of demyelination.  No intracranial hemorrhage.  Moderate number of demyelinating plaques most notable periventricular region slightly greater on the left.  None of these areas demonstrates restricted motion or enhancement as may be seen with areas of active demyelination.  Mild global atrophy without hydrocephalus.  Major intracranial vascular structures are patent.  No intracranial mass or abnormal enhancement.  IMPRESSION: No acute infarct seen separate from the areas of demyelination.  Moderate number of demyelinating plaques most notable periventricular region slightly greater on the left.  None of these areas demonstrates restricted motion or  enhancement as may be seen with areas of active demyelination.  Mild global atrophy without hydrocephalus.  Original Report Authenticated By: SDoug Sou M.D.    MDM  Plan MRI scan to rule out demyelinating disease Neurologic followup Diagnoses vertigo        SOrlie Dakin MD 12/17/11 15686

## 2011-12-17 NOTE — ED Notes (Signed)
MRI contacted to have  A CD made for PT to take home .

## 2011-12-17 NOTE — ED Notes (Signed)
Per ems- Pt was at cook out, became dizzy, went home had "explosive diarrhea". Pt remained dizzy, pt also c/o epigastric discomfort. 146/88, HR 85, 99%. NAD. Speaking in clear, concise sentences. Denying any CP. EKG unremarkable SR.

## 2011-12-18 DIAGNOSIS — G35 Multiple sclerosis: Secondary | ICD-10-CM | POA: Diagnosis not present

## 2011-12-18 DIAGNOSIS — R51 Headache: Secondary | ICD-10-CM | POA: Diagnosis not present

## 2011-12-18 DIAGNOSIS — R42 Dizziness and giddiness: Secondary | ICD-10-CM | POA: Diagnosis not present

## 2011-12-18 DIAGNOSIS — G4700 Insomnia, unspecified: Secondary | ICD-10-CM | POA: Diagnosis not present

## 2012-02-14 DIAGNOSIS — H8110 Benign paroxysmal vertigo, unspecified ear: Secondary | ICD-10-CM | POA: Diagnosis not present

## 2012-02-14 DIAGNOSIS — G35 Multiple sclerosis: Secondary | ICD-10-CM | POA: Diagnosis not present

## 2012-04-25 DIAGNOSIS — K219 Gastro-esophageal reflux disease without esophagitis: Secondary | ICD-10-CM | POA: Diagnosis not present

## 2012-04-25 DIAGNOSIS — R05 Cough: Secondary | ICD-10-CM | POA: Diagnosis not present

## 2012-05-01 DIAGNOSIS — R42 Dizziness and giddiness: Secondary | ICD-10-CM | POA: Diagnosis not present

## 2012-05-08 DIAGNOSIS — G35 Multiple sclerosis: Secondary | ICD-10-CM | POA: Diagnosis not present

## 2012-05-08 DIAGNOSIS — R5383 Other fatigue: Secondary | ICD-10-CM | POA: Diagnosis not present

## 2012-05-08 DIAGNOSIS — R209 Unspecified disturbances of skin sensation: Secondary | ICD-10-CM | POA: Diagnosis not present

## 2012-05-08 DIAGNOSIS — R42 Dizziness and giddiness: Secondary | ICD-10-CM | POA: Diagnosis not present

## 2012-05-08 DIAGNOSIS — R5381 Other malaise: Secondary | ICD-10-CM | POA: Diagnosis not present

## 2012-05-26 DIAGNOSIS — Z23 Encounter for immunization: Secondary | ICD-10-CM | POA: Diagnosis not present

## 2012-07-28 DIAGNOSIS — R195 Other fecal abnormalities: Secondary | ICD-10-CM | POA: Diagnosis not present

## 2012-07-28 DIAGNOSIS — R1013 Epigastric pain: Secondary | ICD-10-CM | POA: Diagnosis not present

## 2012-07-29 ENCOUNTER — Other Ambulatory Visit: Payer: Self-pay | Admitting: Gastroenterology

## 2012-07-29 DIAGNOSIS — K900 Celiac disease: Secondary | ICD-10-CM | POA: Diagnosis not present

## 2012-07-29 DIAGNOSIS — D131 Benign neoplasm of stomach: Secondary | ICD-10-CM | POA: Diagnosis not present

## 2012-07-29 DIAGNOSIS — R1013 Epigastric pain: Secondary | ICD-10-CM | POA: Diagnosis not present

## 2012-07-29 DIAGNOSIS — D1330 Benign neoplasm of unspecified part of small intestine: Secondary | ICD-10-CM | POA: Diagnosis not present

## 2012-08-12 DIAGNOSIS — R195 Other fecal abnormalities: Secondary | ICD-10-CM | POA: Diagnosis not present

## 2012-10-09 DIAGNOSIS — R42 Dizziness and giddiness: Secondary | ICD-10-CM | POA: Diagnosis not present

## 2012-10-09 DIAGNOSIS — R5383 Other fatigue: Secondary | ICD-10-CM | POA: Diagnosis not present

## 2012-10-09 DIAGNOSIS — G35 Multiple sclerosis: Secondary | ICD-10-CM | POA: Diagnosis not present

## 2012-10-09 DIAGNOSIS — G4700 Insomnia, unspecified: Secondary | ICD-10-CM | POA: Diagnosis not present

## 2012-10-09 DIAGNOSIS — R5381 Other malaise: Secondary | ICD-10-CM | POA: Diagnosis not present

## 2012-11-04 ENCOUNTER — Encounter: Payer: Medicare Other | Admitting: Gynecology

## 2012-11-18 DIAGNOSIS — R195 Other fecal abnormalities: Secondary | ICD-10-CM | POA: Diagnosis not present

## 2012-11-18 DIAGNOSIS — K900 Celiac disease: Secondary | ICD-10-CM | POA: Diagnosis not present

## 2012-11-18 DIAGNOSIS — K5901 Slow transit constipation: Secondary | ICD-10-CM | POA: Diagnosis not present

## 2012-11-27 ENCOUNTER — Emergency Department (HOSPITAL_COMMUNITY): Payer: Medicare Other

## 2012-11-27 ENCOUNTER — Emergency Department (HOSPITAL_COMMUNITY)
Admission: EM | Admit: 2012-11-27 | Discharge: 2012-11-28 | Disposition: A | Discharge status: Home / Self Care | Payer: Medicare Other | Attending: Emergency Medicine | Admitting: Emergency Medicine

## 2012-11-27 ENCOUNTER — Encounter (HOSPITAL_COMMUNITY): Payer: Self-pay | Admitting: *Deleted

## 2012-11-27 DIAGNOSIS — Z8742 Personal history of other diseases of the female genital tract: Secondary | ICD-10-CM | POA: Diagnosis not present

## 2012-11-27 DIAGNOSIS — Z791 Long term (current) use of non-steroidal anti-inflammatories (NSAID): Secondary | ICD-10-CM | POA: Insufficient documentation

## 2012-11-27 DIAGNOSIS — Z79899 Other long term (current) drug therapy: Secondary | ICD-10-CM | POA: Insufficient documentation

## 2012-11-27 DIAGNOSIS — E7800 Pure hypercholesterolemia, unspecified: Secondary | ICD-10-CM | POA: Insufficient documentation

## 2012-11-27 DIAGNOSIS — E039 Hypothyroidism, unspecified: Secondary | ICD-10-CM | POA: Diagnosis not present

## 2012-11-27 DIAGNOSIS — G35 Multiple sclerosis: Secondary | ICD-10-CM | POA: Diagnosis not present

## 2012-11-27 DIAGNOSIS — Z8719 Personal history of other diseases of the digestive system: Secondary | ICD-10-CM | POA: Diagnosis not present

## 2012-11-27 DIAGNOSIS — G8929 Other chronic pain: Secondary | ICD-10-CM | POA: Diagnosis not present

## 2012-11-27 DIAGNOSIS — Z7982 Long term (current) use of aspirin: Secondary | ICD-10-CM | POA: Diagnosis not present

## 2012-11-27 DIAGNOSIS — R209 Unspecified disturbances of skin sensation: Secondary | ICD-10-CM | POA: Insufficient documentation

## 2012-11-27 DIAGNOSIS — Z87891 Personal history of nicotine dependence: Secondary | ICD-10-CM | POA: Diagnosis not present

## 2012-11-27 DIAGNOSIS — R079 Chest pain, unspecified: Secondary | ICD-10-CM

## 2012-11-27 DIAGNOSIS — R0789 Other chest pain: Secondary | ICD-10-CM | POA: Diagnosis not present

## 2012-11-27 HISTORY — DX: Pure hypercholesterolemia, unspecified: E78.00

## 2012-11-27 LAB — COMPREHENSIVE METABOLIC PANEL
ALT: 44 U/L — ABNORMAL HIGH (ref 0–35)
Alkaline Phosphatase: 79 U/L (ref 39–117)
BUN: 13 mg/dL (ref 6–23)
CO2: 28 mEq/L (ref 19–32)
Calcium: 9.5 mg/dL (ref 8.4–10.5)
GFR calc Af Amer: >90 mL/min (ref >90)
GFR calc non Af Amer: 90 mL/min — ABNORMAL LOW (ref >90)
Glucose, Bld: 106 mg/dL — ABNORMAL HIGH (ref 70–99)
Potassium: 4 mEq/L (ref 3.5–5.1)
Total Protein: 7.4 g/dL (ref 6.0–8.3)

## 2012-11-27 LAB — CBC WITH DIFFERENTIAL/PLATELET
Eosinophils Absolute: 0.1 10*3/uL (ref 0.0–0.7)
Eosinophils Relative: 1 % (ref 0–5)
HCT: 40.7 % (ref 36.0–46.0)
Hemoglobin: 14.3 g/dL (ref 12.0–15.0)
Lymphocytes Relative: 39 % (ref 12–46)
Lymphs Abs: 2.4 10*3/uL (ref 0.7–4.0)
MCH: 29.2 pg (ref 26.0–34.0)
MCV: 83.2 fL (ref 78.0–100.0)
Monocytes Relative: 7 % (ref 3–12)
RBC: 4.89 MIL/uL (ref 3.87–5.11)
WBC: 6.1 10*3/uL (ref 4.0–10.5)

## 2012-11-27 LAB — POCT I-STAT TROPONIN I: Troponin i, poc: 0 ng/mL (ref 0.00–0.08)

## 2012-11-27 NOTE — ED Notes (Signed)
Pharmacy in room at this time.

## 2012-11-27 NOTE — ED Notes (Signed)
Patient laying on stretcher at this time. States she is having pain in left side of chest around clavicle and states numbness and tingling in left arm from elbow down. States she does have MS. Patient rates pain 4/10 at this time. No acute distress noted,  resp are even and unlabored. Plan of care discussed with patient, awaiting MD and results at this time. Call light at bedside. Family in room with patient. Will continue to monitor.

## 2012-11-27 NOTE — ED Notes (Signed)
Patient resting on stretcher at this time. States she is only having mild pain in left shoulder. Denies any pain in chest, states the numbness and tingling in arm is gone at this time. Plan of care discussed. Call light at bedside. Family in room. Will continue to monitor.

## 2012-11-27 NOTE — ED Notes (Signed)
Pt states that at 01:00 this morning she started having CP. Pt states she took 5 baby aspirin and the pain is now a dull pain that is in the left center chest radiates down her left arm and causes numbness to her left arm. Pt states that she is burping and feel indigestion as well.

## 2012-11-27 NOTE — ED Notes (Signed)
ED doc at bedside.

## 2012-11-27 NOTE — ED Provider Notes (Signed)
History     CSN: [id number]  Arrival date & time 11/27/12  1932   First MD Initiated Contact with Patient 11/27/12 2259      Chief Complaint  Patient presents with  . Chest Pain    (Consider location/radiation/quality/duration/timing/severity/associated sxs/prior treatment) The history is provided by the patient.  Leslie Ryan is a 62 y.o. female hx of Multiple sclerosis, endometriosis here with chest pain, L arm pain. Left-sided chest pains since 1 AM yesterday. She took some baby aspirin and felt slightly better. The pain was intermittent it radiated to her left shoulder. She has chronic left hand numbness from multiple sclerosis and a slightly worse yesterday. Denies any weakness. Denies any history of CAD or cardiac stents. Last stress test was 6 years ago.  Past Medical History  Diagnosis Date  . Multiple sclerosis   . Endometriosis   . Hypothyroidism   . Cervical dysplasia   . Celiac disease   . High cholesterol     Past Surgical History  Procedure Laterality Date  . Gynecologic cryosurgery  1987  . Cholecystectomy    . Rotator cuff repair    . Finger surgery    . Abdominal hysterectomy  1999    TAH,BSO menorrhgia,,endometriosis  . Colposcopy    . Oophorectomy      Family History  Problem Relation Age of Onset  . Breast cancer Mother     age 11's  . Cancer Mother     UTERINE  . Diabetes Father   . Hypertension Father   . Heart disease Father   . Cancer Maternal Grandfather     Stomach cancer  . Heart disease Paternal Grandfather     History  Substance Use Topics  . Smoking status: Former Research scientist (life sciences)  . Smokeless tobacco: Not on file  . Alcohol Use: No    OB History   Grav Para Term Preterm Abortions TAB SAB Ect Mult Living   0               Review of Systems  Cardiovascular: Positive for chest pain.  All other systems reviewed and are negative.    Allergies  Erythromycin  Home Medications   Current Outpatient Rx  Name  Route   Sig  Dispense  Refill  . aspirin 81 MG tablet   Oral   Take 162 mg by mouth 2 (two) times daily. Took 2 tabs this morning, 1 tab around lunch time, then 2 more tablets around 4pm.         . Cholecalciferol (VITAMIN D-3) 5000 UNITS TABS   Oral   Take 1 tablet by mouth daily.          Marland Kitchen FLUoxetine (PROZAC) 20 MG capsule   Oral   Take 20 mg by mouth daily.           Marland Kitchen guaiFENesin (MUCINEX) 600 MG 12 hr tablet   Oral   Take 1,200 mg by mouth 2 (two) times daily.         . interferon beta-1a (AVONEX) 30 MCG/0.5ML injection   Intramuscular   Inject 30 mcg into the muscle every 7 (seven) days.           . lansoprazole (PREVACID) 30 MG capsule   Oral   Take 60 mg by mouth daily.          Marland Kitchen levothyroxine (SYNTHROID, LEVOTHROID) 112 MCG tablet   Oral   Take 112 mcg by mouth daily.           Marland Kitchen  Magnesium Hydroxide (MILK OF MAGNESIA PO)   Oral   Take 3 tablets by mouth every other day.         . Melatonin 5 MG TABS   Oral   Take 5 mg by mouth at bedtime.          . naproxen sodium (ANAPROX) 220 MG tablet   Oral   Take 220 mg by mouth 2 (two) times daily with a meal.         . pravastatin (PRAVACHOL) 40 MG tablet   Oral   Take 40 mg by mouth daily.           . Probiotic Product (PROBIOTIC DAILY PO)   Oral   Take 1 tablet by mouth daily.         . Throat Lozenges (COUGH DROPS MT)   Mouth/Throat   Use as directed 1 lozenge in the mouth or throat daily as needed (for sore throat).          . vitamin E 400 UNIT capsule   Oral   Take 400 Units by mouth daily.           Marland Kitchen zolpidem (AMBIEN) 10 MG tablet   Oral   Take 5-10 mg by mouth at bedtime as needed for sleep.           BP 118/70  Pulse 79  Temp(Src) 98 F (36.7 C) (Oral)  Resp 15  SpO2 95%  LMP 08/20/1997  Physical Exam  Nursing note and vitals reviewed. Constitutional: She is oriented to person, place, and time. She appears well-developed and well-nourished.  NAD   HENT:   Head: Normocephalic.  Mouth/Throat: Oropharynx is clear and moist.  Eyes: Conjunctivae are normal. Pupils are equal, round, and reactive to light.  Neck: Normal range of motion. Neck supple.  Cardiovascular: Normal rate, regular rhythm and normal heart sounds.   Pulmonary/Chest: Effort normal and breath sounds normal. No respiratory distress. She has no wheezes. She has no rales.  Not reproducible   Abdominal: Soft. Bowel sounds are normal. She exhibits no distension. There is no tenderness. There is no rebound and no guarding.  Musculoskeletal: Normal range of motion. She exhibits no edema and no tenderness.  Neurological: She is alert and oriented to person, place, and time.  Dec sensation L hand, chronic   Skin: Skin is warm and dry.  Psychiatric: She has a normal mood and affect. Her behavior is normal. Judgment and thought content normal.    ED Course  Procedures (including critical care time)  Labs Reviewed  COMPREHENSIVE METABOLIC PANEL - Abnormal; Notable for the following:    Glucose, Bld 106 (*)    ALT 44 (*)    Total Bilirubin 0.2 (*)    GFR calc non Af Amer 90 (*)    All other components within normal limits  CBC WITH DIFFERENTIAL  POCT I-STAT TROPONIN I  POCT I-STAT TROPONIN I   Dg Chest 2 View  11/27/2012  *RADIOLOGY REPORT*  Clinical Data: Chest pain, chronic cough  CHEST - 2 VIEW  Comparison: 05/04/2011  Findings: Cardiomediastinal silhouette is within normal limits. The lungs are clear. No pleural effusion.  No pneumothorax.  No acute osseous abnormality.  Cholecystectomy clips are noted.  IMPRESSION: Normal chest.   Original Report Authenticated By: Conchita Paris, M.D.      No diagnosis found.   Date: 11/27/2012  Rate: 104  Rhythm: sinus tachycardia  QRS Axis: normal  Intervals: normal  ST/T Wave abnormalities:  normal  Conduction Disutrbances:none  Narrative Interpretation:   Old EKG Reviewed: unchanged    MDM  Leslie Ryan is a 62 y.o.  female here with chest pain, now pain free. Will get trop x 2. CXR unremarkable. She has f/u so can get outpatient stress test.   12:13 AM Trop neg x 2. Will f/u outpatient for stress test.         Wandra Arthurs, MD 11/28/12 [phone]

## 2012-11-27 NOTE — ED Notes (Signed)
Pt changed into gown and placed on cardiac monitor and continuous pulse ox.

## 2012-11-28 NOTE — ED Notes (Signed)
Patient given discharge instructions for chest pain. No rx was provided. Advised to follow up with primary care and to schedule stress test, or to return to this department if condition worsens. Patient voiced understanding of all instructions and had no further questions. Patient ambulated to front lobby without difficulty.

## 2012-12-18 DIAGNOSIS — E785 Hyperlipidemia, unspecified: Secondary | ICD-10-CM | POA: Diagnosis not present

## 2012-12-18 DIAGNOSIS — E049 Nontoxic goiter, unspecified: Secondary | ICD-10-CM | POA: Diagnosis not present

## 2012-12-18 DIAGNOSIS — Z79899 Other long term (current) drug therapy: Secondary | ICD-10-CM | POA: Diagnosis not present

## 2012-12-23 DIAGNOSIS — Z1231 Encounter for screening mammogram for malignant neoplasm of breast: Secondary | ICD-10-CM | POA: Diagnosis not present

## 2012-12-24 ENCOUNTER — Ambulatory Visit (INDEPENDENT_AMBULATORY_CARE_PROVIDER_SITE_OTHER): Payer: Medicare Other | Admitting: Gynecology

## 2012-12-24 ENCOUNTER — Encounter: Payer: Self-pay | Admitting: Gynecology

## 2012-12-24 VITALS — BP 130/82 | Ht 62.5 in | Wt 197.0 lb

## 2012-12-24 DIAGNOSIS — R1013 Epigastric pain: Secondary | ICD-10-CM

## 2012-12-24 DIAGNOSIS — Z8639 Personal history of other endocrine, nutritional and metabolic disease: Secondary | ICD-10-CM | POA: Diagnosis not present

## 2012-12-24 DIAGNOSIS — Z780 Asymptomatic menopausal state: Secondary | ICD-10-CM | POA: Diagnosis not present

## 2012-12-24 DIAGNOSIS — Z1159 Encounter for screening for other viral diseases: Secondary | ICD-10-CM

## 2012-12-24 DIAGNOSIS — N951 Menopausal and female climacteric states: Secondary | ICD-10-CM | POA: Diagnosis not present

## 2012-12-24 DIAGNOSIS — E559 Vitamin D deficiency, unspecified: Secondary | ICD-10-CM | POA: Diagnosis not present

## 2012-12-24 NOTE — Progress Notes (Signed)
Leslie Ryan [facility] 12/09/1950 [id number]   History:    62 y.o. who presented today for her GYN exam. Review of patient's records indicated she had a [place] with benign pathology. Her last Pap smear was normal in 2012. Patient back in 1977 has slight dysplasia on Pap smear. Patient had a colonoscopy in 2013 with benign polyps haven't been detected. Her mammogram was this year which was normal. She has had history vitamin D deficiency in the past. She has informed me that she is taking 5000 units of vitamin D daily. She has complained of midepigastric discomfort. She is seen her internist Dr. Reynaldo Minium tomorrow to discuss the labs are drawn in his office recently. Patient does not recall ever having received a Tdap vaccine. She does state that her shingles vaccine was administered not too long ago. Patient has history of MS. She has informed me that she was recently diagnosed with celiac disease.  Past medical history,surgical history, family history and social history were all reviewed and documented in the EPIC chart.  Gynecologic History Patient's last menstrual period was 08/20/1997. Contraception: post menopausal status Last Pap: 2012. Results were: normal Last mammogram: 2014. Results were: normal  Obstetric History OB History   Grav Para Term Preterm Abortions TAB SAB Ect Mult Living   0                ROS: A ROS was performed and pertinent positives and negatives are included in the history.  GENERAL: No fevers or chills. HEENT: No change in vision, no earache, sore throat or sinus congestion. NECK: No pain or stiffness. CARDIOVASCULAR: No chest pain or pressure. No palpitations. PULMONARY: No shortness of breath, cough or wheeze. GASTROINTESTINAL: midepigastric discomfort, nausea, vomiting or diarrhea, melena or bright red blood per rectum. GENITOURINARY: No urinary frequency, urgency, hesitancy or dysuria. MUSCULOSKELETAL: No joint or muscle pain, no back pain, no recent trauma.  DERMATOLOGIC: No rash, no itching, no lesions. ENDOCRINE: No polyuria, polydipsia, no heat or cold intolerance. No recent change in weight. HEMATOLOGICAL: No anemia or easy bruising or bleeding. NEUROLOGIC: No headache, seizures, numbness, tingling or weakness. PSYCHIATRIC: No depression, no loss of interest in normal activity or change in sleep pattern.     Exam: chaperone present  BP 130/82  Ht 5' 2.5" (1.588 m)  Wt 197 lb (89.359 kg)  BMI 35.44 kg/m2  LMP 08/20/1997  Body mass index is 35.44 kg/(m^2).  General appearance : Well developed well nourished female. No acute distress HEENT: Neck supple, trachea midline, no carotid bruits, no thyroidmegaly Lungs: Clear to auscultation, no rhonchi or wheezes, or rib retractions  Heart: Regular rate and rhythm, no murmurs or gallops Breast:Examined in sitting and supine position were symmetrical in appearance, no palpable masses or tenderness,  no skin retraction, no nipple inversion, no nipple discharge, no skin discoloration, no axillary or supraclavicular lymphadenopathy Abdomen:midepigastric tenderness, no rebound or guarding Extremities: no edema or skin discoloration or tenderness  Pelvic:  Bartholin, Urethra, Skene Glands: Within normal limits             Vagina: No gross lesions or discharge  Cervix:absent  Uterus  Absent  Adnexa  Without masses or tenderness  Anus and perineum  normal   Rectovaginal  normal sphincter tone without palpated masses or tenderness             Hemoccult cards will be provided for her to submit for testing at a later.    Assessment/Plan:  62 y.o. female with  normal gynecological examination with the exception of the tenderness in the mid epigastric area. I am concerned that she's been taking too much vitamin D 5000 units daily. I've asked her to decreased to 2000 units daily. We will check her vitamin D level today. Dr. Reynaldo Minium had ordered her labs recently and I reviewed them and I did not see  an  amylase or lipase so I went ahead and  Ordered them  and he will see these results tomorrow. Patient will also schedule her bone density study which is overdue due. We discussed the new Pap smear screening guidelines and no Pap smear was done today. We discussed importance of calcium and vitamin D and regular exercise for osteoporosis prevention. Patient will schedule her bone density study here in the office the next couple weeks.  New CDC guidelines is recommending patients be tested once in her lifetime for hepatitis C antibody who were born between [birth year] through 1966. This was discussed with the patient today and has agreed to be tested today.  She was reminded to discuss with Dr. Reynaldo Minium her midepigastric discomfort to see if he may want to consider doing an upper abdominal ultrasound. She has had a prior history of cholecystectomy .  Terrance Mass MD, 6:39 PM 12/24/2012   And a half of the is a for a had a for a and and and will will

## 2012-12-24 NOTE — Patient Instructions (Addendum)
Tetanus, Diphtheria, Pertussis (Tdap) Vaccine What You Need to Know WHY GET VACCINATED? Tetanus, diphtheria and pertussis can be very serious diseases, even for adolescents and adults. Tdap vaccine can protect [place] from these diseases. TETANUS (Lockjaw) causes painful muscle tightening and stiffness, usually all over the body.  It can lead to tightening of muscles in the head and neck so you can't open your mouth, swallow, or sometimes even breathe. Tetanus kills about 1 out of 5 people who are infected. DIPHTHERIA can cause a thick coating to form in the back of the throat.  It can lead to breathing problems, paralysis, heart failure, and death. PERTUSSIS (Whooping Cough) causes severe coughing spells, which can cause difficulty breathing, vomiting and disturbed sleep.  It can also lead to weight loss, incontinence, and rib fractures. Up to 2 in 100 adolescents and 5 in 100 adults with pertussis are hospitalized or have complications, which could include pneumonia and death. These diseases are caused by bacteria. Diphtheria and pertussis are spread from person to person through coughing or sneezing. Tetanus enters the body through cuts, scratches, or wounds. Before vaccines, the [place] saw as many as 200,000 cases a year of diphtheria and pertussis, and hundreds of cases of tetanus. Since vaccination began, tetanus and diphtheria have dropped by about 99% and pertussis by about 80%. TDAP VACCINE Tdap vaccine can protect adolescents and adults from tetanus, diphtheria, and pertussis. One dose of Tdap is routinely given at age 15 or 67. People who did not get Tdap at that age should get it as soon as possible. Tdap is especially important for health care professionals and anyone having close contact with a baby younger than 12 months. Pregnant women should get a dose of Tdap during every pregnancy, to protect the newborn from pertussis. Infants are most at risk for severe, life-threatening  complications from pertussis. A similar vaccine, called Td, protects from tetanus and diphtheria, but not pertussis. A Td booster should be given every 10 years. Tdap may be given as one of these boosters if you have not already gotten a dose. Tdap may also be given after a severe cut or burn to prevent tetanus infection. Your doctor can give you more information. Tdap may safely be given at the same time as other vaccines. SOME PEOPLE SHOULD NOT GET THIS VACCINE  If you ever had a life-threatening allergic reaction after a dose of any tetanus, diphtheria, or pertussis containing vaccine, OR if you have a severe allergy to any part of this vaccine, you should not get Tdap. Tell your doctor if you have any severe allergies.  If you had a coma, or long or multiple seizures within 7 days after a childhood dose of DTP or DTaP, you should not get Tdap, unless a cause other than the vaccine was found. You can still get Td.  Talk to your doctor if you:  have epilepsy or another nervous system problem,  had severe pain or swelling after any vaccine containing diphtheria, tetanus or pertussis,  ever had Guillain-Barr Syndrome (GBS),  aren't feeling well on the day the shot is scheduled. RISKS OF A VACCINE REACTION With any medicine, including vaccines, there is a chance of side effects. These are usually mild and go away on their own, but serious reactions are also possible. Brief fainting spells can follow a vaccination, leading to injuries from falling. Sitting or lying down for about 15 minutes can help prevent these. Tell your doctor if you feel dizzy or light-headed, or  have vision changes or ringing in the ears. Mild problems following Tdap (Did not interfere with activities)  Pain where the shot was given (about 3 in 4 adolescents or 2 in 3 adults)  Redness or swelling where the shot was given (about 1 person in 5)  Mild fever of at least 100.19F (up to about 1 in 25 adolescents or 1 in  100 adults)  Headache (about 3 or 4 people in 10)  Tiredness (about 1 person in 3 or 4)  Nausea, vomiting, diarrhea, stomach ache (up to 1 in 4 adolescents or 1 in 10 adults)  Chills, body aches, sore joints, rash, swollen glands (uncommon) Moderate problems following Tdap (Interfered with activities, but did not require medical attention)  Pain where the shot was given (about 1 in 5 adolescents or 1 in 100 adults)  Redness or swelling where the shot was given (up to about 1 in 16 adolescents or 1 in 25 adults)  Fever over 102F (about 1 in 100 adolescents or 1 in 250 adults)  Headache (about 3 in 20 adolescents or 1 in 10 adults)  Nausea, vomiting, diarrhea, stomach ache (up to 1 or 3 people in 100)  Swelling of the entire arm where the shot was given (up to about 3 in 100). Severe problems following Tdap (Unable to perform usual activities, required medical attention)  Swelling, severe pain, bleeding and redness in the arm where the shot was given (rare). A severe allergic reaction could occur after any vaccine (estimated less than 1 in a million doses). WHAT IF THERE IS A SERIOUS REACTION? What should I look for?  Look for anything that concerns you, such as signs of a severe allergic reaction, very high fever, or behavior changes. Signs of a severe allergic reaction can include hives, swelling of the face and throat, difficulty breathing, a fast heartbeat, dizziness, and weakness. These would start a few minutes to a few hours after the vaccination. What should I do?  If you think it is a severe allergic reaction or other emergency that can't wait, call 9-1-1 or get the person to the nearest hospital. Otherwise, call your doctor.  Afterward, the reaction should be reported to the "Vaccine Adverse Event Reporting System" (VAERS). Your doctor might file this report, or you can do it yourself through the VAERS web site at www.vaers.SamedayNews.es, or by calling 947-564-7653. VAERS is  only for reporting reactions. They do not give medical advice.  THE NATIONAL VACCINE INJURY COMPENSATION PROGRAM The National Vaccine Injury Compensation Program (VICP) is a federal program that was created to compensate people who may have been injured by certain vaccines. Persons who believe they may have been injured by a vaccine can learn about the program and about filing a claim by calling 414 230 4523 or visiting the [place] website at GoldCloset.com.ee. HOW CAN I LEARN MORE?  Ask your doctor.  Call your local or state health department.  Contact the Centers for Disease Control and Prevention (CDC):  Call 365 118 1869 or visit CDC's website at http://hunter.com/ CDC Tdap Vaccine VIS (12/27/11) Document Released: 02/05/2012 Document Reviewed: 02/05/2012 [facility] Patient Information 2013 [place].

## 2012-12-25 DIAGNOSIS — E049 Nontoxic goiter, unspecified: Secondary | ICD-10-CM | POA: Diagnosis not present

## 2012-12-25 DIAGNOSIS — Z6834 Body mass index (BMI) 34.0-34.9, adult: Secondary | ICD-10-CM | POA: Diagnosis not present

## 2012-12-25 DIAGNOSIS — K219 Gastro-esophageal reflux disease without esophagitis: Secondary | ICD-10-CM | POA: Diagnosis not present

## 2012-12-25 DIAGNOSIS — H8110 Benign paroxysmal vertigo, unspecified ear: Secondary | ICD-10-CM | POA: Diagnosis not present

## 2012-12-25 DIAGNOSIS — Z0000 Encounter for general adult medical examination without abnormal findings: Secondary | ICD-10-CM | POA: Diagnosis not present

## 2012-12-25 DIAGNOSIS — E785 Hyperlipidemia, unspecified: Secondary | ICD-10-CM | POA: Diagnosis not present

## 2012-12-25 DIAGNOSIS — G35 Multiple sclerosis: Secondary | ICD-10-CM | POA: Diagnosis not present

## 2012-12-25 LAB — AMYLASE: Amylase: 27 U/L (ref 0–105)

## 2012-12-25 LAB — VITAMIN D 25 HYDROXY (VIT D DEFICIENCY, FRACTURES): Vit D, 25-Hydroxy: 71 ng/mL (ref 30–89)

## 2012-12-26 ENCOUNTER — Encounter: Payer: Self-pay | Admitting: Gynecology

## 2013-01-15 ENCOUNTER — Ambulatory Visit (INDEPENDENT_AMBULATORY_CARE_PROVIDER_SITE_OTHER): Payer: Medicare Other

## 2013-01-15 DIAGNOSIS — Z780 Asymptomatic menopausal state: Secondary | ICD-10-CM

## 2013-01-15 DIAGNOSIS — Z8639 Personal history of other endocrine, nutritional and metabolic disease: Secondary | ICD-10-CM | POA: Diagnosis not present

## 2013-01-15 DIAGNOSIS — N951 Menopausal and female climacteric states: Secondary | ICD-10-CM

## 2013-02-24 DIAGNOSIS — R42 Dizziness and giddiness: Secondary | ICD-10-CM | POA: Diagnosis not present

## 2013-02-24 DIAGNOSIS — R209 Unspecified disturbances of skin sensation: Secondary | ICD-10-CM | POA: Diagnosis not present

## 2013-02-24 DIAGNOSIS — R269 Unspecified abnormalities of gait and mobility: Secondary | ICD-10-CM | POA: Diagnosis not present

## 2013-02-24 DIAGNOSIS — G35 Multiple sclerosis: Secondary | ICD-10-CM | POA: Diagnosis not present

## 2013-02-25 DIAGNOSIS — R42 Dizziness and giddiness: Secondary | ICD-10-CM | POA: Diagnosis not present

## 2013-02-25 DIAGNOSIS — G35 Multiple sclerosis: Secondary | ICD-10-CM | POA: Diagnosis not present

## 2013-02-26 DIAGNOSIS — G35 Multiple sclerosis: Secondary | ICD-10-CM | POA: Diagnosis not present

## 2013-02-26 DIAGNOSIS — R269 Unspecified abnormalities of gait and mobility: Secondary | ICD-10-CM | POA: Diagnosis not present

## 2013-02-26 DIAGNOSIS — Z79899 Other long term (current) drug therapy: Secondary | ICD-10-CM | POA: Diagnosis not present

## 2013-02-26 DIAGNOSIS — R42 Dizziness and giddiness: Secondary | ICD-10-CM | POA: Diagnosis not present

## 2013-04-27 DIAGNOSIS — Z79899 Other long term (current) drug therapy: Secondary | ICD-10-CM | POA: Diagnosis not present

## 2013-05-28 DIAGNOSIS — R209 Unspecified disturbances of skin sensation: Secondary | ICD-10-CM | POA: Diagnosis not present

## 2013-05-28 DIAGNOSIS — R269 Unspecified abnormalities of gait and mobility: Secondary | ICD-10-CM | POA: Diagnosis not present

## 2013-05-28 DIAGNOSIS — G4700 Insomnia, unspecified: Secondary | ICD-10-CM | POA: Diagnosis not present

## 2013-05-28 DIAGNOSIS — G35 Multiple sclerosis: Secondary | ICD-10-CM | POA: Diagnosis not present

## 2013-05-28 DIAGNOSIS — R42 Dizziness and giddiness: Secondary | ICD-10-CM | POA: Diagnosis not present

## 2013-06-01 DIAGNOSIS — L821 Other seborrheic keratosis: Secondary | ICD-10-CM | POA: Diagnosis not present

## 2013-06-01 DIAGNOSIS — L908 Other atrophic disorders of skin: Secondary | ICD-10-CM | POA: Diagnosis not present

## 2013-06-03 DIAGNOSIS — Z23 Encounter for immunization: Secondary | ICD-10-CM | POA: Diagnosis not present

## 2013-06-21 ENCOUNTER — Encounter (HOSPITAL_COMMUNITY): Payer: Self-pay | Admitting: Emergency Medicine

## 2013-06-21 ENCOUNTER — Emergency Department (HOSPITAL_COMMUNITY)
Admission: EM | Admit: 2013-06-21 | Discharge: 2013-06-22 | Disposition: A | Discharge status: Home / Self Care | Payer: Medicare Other | Attending: Emergency Medicine | Admitting: Emergency Medicine

## 2013-06-21 ENCOUNTER — Emergency Department (HOSPITAL_COMMUNITY): Payer: Medicare Other

## 2013-06-21 DIAGNOSIS — R1031 Right lower quadrant pain: Secondary | ICD-10-CM | POA: Insufficient documentation

## 2013-06-21 DIAGNOSIS — E7800 Pure hypercholesterolemia, unspecified: Secondary | ICD-10-CM | POA: Insufficient documentation

## 2013-06-21 DIAGNOSIS — Z79899 Other long term (current) drug therapy: Secondary | ICD-10-CM | POA: Insufficient documentation

## 2013-06-21 DIAGNOSIS — K7689 Other specified diseases of liver: Secondary | ICD-10-CM | POA: Diagnosis not present

## 2013-06-21 DIAGNOSIS — Z8719 Personal history of other diseases of the digestive system: Secondary | ICD-10-CM | POA: Diagnosis not present

## 2013-06-21 DIAGNOSIS — Z90710 Acquired absence of both cervix and uterus: Secondary | ICD-10-CM | POA: Insufficient documentation

## 2013-06-21 DIAGNOSIS — Z8669 Personal history of other diseases of the nervous system and sense organs: Secondary | ICD-10-CM | POA: Diagnosis not present

## 2013-06-21 DIAGNOSIS — Z8742 Personal history of other diseases of the female genital tract: Secondary | ICD-10-CM | POA: Diagnosis not present

## 2013-06-21 DIAGNOSIS — R109 Unspecified abdominal pain: Secondary | ICD-10-CM

## 2013-06-21 DIAGNOSIS — Z9889 Other specified postprocedural states: Secondary | ICD-10-CM | POA: Insufficient documentation

## 2013-06-21 DIAGNOSIS — Z9089 Acquired absence of other organs: Secondary | ICD-10-CM | POA: Insufficient documentation

## 2013-06-21 DIAGNOSIS — Z87891 Personal history of nicotine dependence: Secondary | ICD-10-CM | POA: Diagnosis not present

## 2013-06-21 DIAGNOSIS — E039 Hypothyroidism, unspecified: Secondary | ICD-10-CM | POA: Diagnosis not present

## 2013-06-21 DIAGNOSIS — Z7982 Long term (current) use of aspirin: Secondary | ICD-10-CM | POA: Insufficient documentation

## 2013-06-21 LAB — COMPREHENSIVE METABOLIC PANEL
Albumin: 3.8 g/dL (ref 3.5–5.2)
BUN: 14 mg/dL (ref 6–23)
CO2: 27 mEq/L (ref 19–32)
Chloride: 100 mEq/L (ref 96–112)
Creatinine, Ser: 0.75 mg/dL (ref 0.50–1.10)
GFR calc Af Amer: >90 mL/min (ref >90)
GFR calc non Af Amer: 88 mL/min — ABNORMAL LOW (ref >90)
Glucose, Bld: 113 mg/dL — ABNORMAL HIGH (ref 70–99)
Potassium: 3.4 mEq/L — ABNORMAL LOW (ref 3.5–5.1)
Total Bilirubin: 0.3 mg/dL (ref 0.3–1.2)

## 2013-06-21 LAB — CBC WITH DIFFERENTIAL/PLATELET
Basophils Relative: 1 % (ref 0–1)
HCT: 41.1 % (ref 36.0–46.0)
Hemoglobin: 14.7 g/dL (ref 12.0–15.0)
Lymphs Abs: 2.2 10*3/uL (ref 0.7–4.0)
MCHC: 35.8 g/dL (ref 30.0–36.0)
MCV: 83.2 fL (ref 78.0–100.0)
Monocytes Absolute: 0.5 10*3/uL (ref 0.1–1.0)
Monocytes Relative: 10 % (ref 3–12)
Neutro Abs: 2.7 10*3/uL (ref 1.7–7.7)
Neutrophils Relative %: 49 % (ref 43–77)
RDW: 12.7 % (ref 11.5–15.5)

## 2013-06-21 LAB — URINALYSIS, ROUTINE W REFLEX MICROSCOPIC
Ketones, ur: NEGATIVE mg/dL
Nitrite: NEGATIVE
Protein, ur: NEGATIVE mg/dL
Urobilinogen, UA: 0.2 mg/dL (ref 0.0–1.0)

## 2013-06-21 LAB — LIPASE, BLOOD: Lipase: 42 U/L (ref 11–59)

## 2013-06-21 MED ORDER — ONDANSETRON HCL 4 MG/2ML IJ SOLN
4.0000 mg | INTRAMUSCULAR | Status: AC
  Administered 2013-06-21: 4 mg via INTRAVENOUS
  Filled 2013-06-21: qty 2

## 2013-06-21 MED ORDER — SODIUM CHLORIDE 0.9 % IV BOLUS (SEPSIS): 1000.0000 mL | Freq: Once | INTRAVENOUS | Status: DC

## 2013-06-21 MED ORDER — MORPHINE SULFATE 4 MG/ML IJ SOLN
4.0000 mg | Freq: Once | INTRAMUSCULAR | Status: AC
  Administered 2013-06-21: 4 mg via INTRAVENOUS
  Filled 2013-06-21: qty 1

## 2013-06-21 MED ORDER — IOHEXOL 300 MG/ML  SOLN
100.0000 mL | Freq: Once | INTRAMUSCULAR | Status: AC | PRN
  Administered 2013-06-21: 100 mL via INTRAVENOUS

## 2013-06-21 MED ORDER — IOHEXOL 300 MG/ML  SOLN
25.0000 mL | Freq: Once | INTRAMUSCULAR | Status: AC | PRN
  Administered 2013-06-21: 25 mL via ORAL

## 2013-06-21 NOTE — ED Notes (Signed)
Attempted IV access without success. Additional RN asked to attempt.

## 2013-06-21 NOTE — ED Provider Notes (Signed)
CSN: [id number]     Arrival date & time 06/21/13  1833 History   None    Chief Complaint  Patient presents with  . Abdominal Pain   (Consider location/radiation/quality/duration/timing/severity/associated sxs/prior Treatment) HPI Comments: Patient is a 62 y/o female with a hx of MS and surgical hx of TAHBSO, colposcopy, and cholecystectomy who presents for RLQ pain with onset last evening. Patient states the symptoms have been constant since onset and at times the pain feels as though it radiates to her right back. Patient denies any modifying factors of her pain and has not taken anything over-the-counter for attempted relief. She denies associated fever, CP, SOB, N/V/D, dysuria, hematuria, melena, hematochezia, flank pain, numbness/tingling, and weakness. Patient's last BM was today which was normal in color and consistency.  Patient is a 62 y.o. female presenting with abdominal pain. The history is provided by the patient. No language interpreter was used.  Abdominal Pain Associated symptoms: no chest pain, no diarrhea, no dysuria, no fever, no hematuria, no nausea, no shortness of breath and no vomiting     Past Medical History  Diagnosis Date  . Multiple sclerosis   . Endometriosis   . Hypothyroidism   . Cervical dysplasia   . Celiac disease   . High cholesterol    Past Surgical History  Procedure Laterality Date  . Gynecologic cryosurgery  1987  . Cholecystectomy    . Rotator cuff repair    . Finger surgery    . Abdominal hysterectomy  1999    TAH,BSO menorrhgia,,endometriosis  . Colposcopy    . Oophorectomy     Family History  Problem Relation Age of Onset  . Breast cancer Mother     age 77's  . Cancer Mother     UTERINE  . Diabetes Father   . Hypertension Father   . Heart disease Father   . Cancer Maternal Grandfather     Stomach cancer  . Heart disease Paternal Grandfather    History  Substance Use Topics  . Smoking status: Former Research scientist (life sciences)  . Smokeless  tobacco: Not on file  . Alcohol Use: No   OB History   Grav Para Term Preterm Abortions TAB SAB Ect Mult Living   0              Review of Systems  Constitutional: Negative for fever.  Respiratory: Negative for shortness of breath.   Cardiovascular: Negative for chest pain.  Gastrointestinal: Positive for abdominal pain. Negative for nausea, vomiting, diarrhea and blood in stool.  Genitourinary: Negative for dysuria, hematuria and flank pain.  Neurological: Negative for weakness and numbness.  All other systems reviewed and are negative.    Allergies  Erythromycin and Morphine and related  Home Medications   Current Outpatient Rx  Name  Route  Sig  Dispense  Refill  . aspirin 81 MG tablet   Oral   Take 162 mg by mouth 2 (two) times daily. Took 2 tabs this morning, 1 tab around lunch time, then 2 more tablets around 4pm.         . Cholecalciferol (VITAMIN D-3) 5000 UNITS TABS   Oral   Take 1 tablet by mouth daily.          Marland Kitchen FLUoxetine (PROZAC) 20 MG capsule   Oral   Take 20 mg by mouth daily.           Marland Kitchen guaiFENesin (MUCINEX) 600 MG 12 hr tablet   Oral   Take 1,200 mg by  mouth 2 (two) times daily.         . lansoprazole (PREVACID) 30 MG capsule   Oral   Take 60 mg by mouth daily.          Marland Kitchen levothyroxine (SYNTHROID, LEVOTHROID) 112 MCG tablet   Oral   Take 112 mcg by mouth daily.           . Magnesium Hydroxide (MILK OF MAGNESIA PO)   Oral   Take 3 tablets by mouth every other day.         . pravastatin (PRAVACHOL) 40 MG tablet   Oral   Take 20 mg by mouth daily.          . Teriflunomide (AUBAGIO) 14 MG TABS   Oral   Take 14 mg by mouth daily.         . vitamin E 400 UNIT capsule   Oral   Take 400 Units by mouth daily.           Marland Kitchen zolpidem (AMBIEN) 10 MG tablet   Oral   Take 5-10 mg by mouth at bedtime as needed for sleep.          BP 114/53  Pulse 80  Temp(Src) 98 F (36.7 C) (Oral)  Resp 18  Wt 193 lb 3.2 oz (87.635  kg)  SpO2 93%  LMP 08/20/1997  Physical Exam  Nursing note and vitals reviewed. Constitutional: She is oriented to person, place, and time. She appears well-developed and well-nourished. No distress.  Patient well and nontoxic appearing, in NAD.  HENT:  Head: Normocephalic and atraumatic.  Eyes: Conjunctivae and EOM are normal. No scleral icterus.  Neck: Normal range of motion.  Cardiovascular: Normal rate, regular rhythm and normal heart sounds.   Pulmonary/Chest: Effort normal. No respiratory distress. She has no wheezes. She has no rales.  Abdominal: Soft. She exhibits no mass. There is tenderness (RLQ at McBurney's point). There is no rebound and no guarding.  No peritoneal signs or evidence of acute surgical abdomen.  Musculoskeletal: Normal range of motion.  Neurological: She is alert and oriented to person, place, and time.  Skin: Skin is warm and dry. No rash noted. She is not diaphoretic. No erythema. No pallor.  Psychiatric: She has a normal mood and affect. Her behavior is normal.    ED Course  Procedures (including critical care time) Labs Review Labs Reviewed  COMPREHENSIVE METABOLIC PANEL - Abnormal; Notable for the following:    Potassium 3.4 (*)    Glucose, Bld 113 (*)    ALT 39 (*)    GFR calc non Af Amer 88 (*)    All other components within normal limits  URINALYSIS, ROUTINE W REFLEX MICROSCOPIC - Abnormal; Notable for the following:    Leukocytes, UA TRACE (*)    All other components within normal limits  URINE MICROSCOPIC-ADD ON - Abnormal; Notable for the following:    Squamous Epithelial / LPF FEW (*)    All other components within normal limits  CBC WITH DIFFERENTIAL  LIPASE, BLOOD   Imaging Review Ct Abdomen Pelvis W Contrast  06/21/2013   CLINICAL DATA:  62 year old female with right lower quadrant pain. Initial encounter.  EXAM: CT ABDOMEN AND PELVIS WITH CONTRAST  TECHNIQUE: Multidetector CT imaging of the abdomen and pelvis was performed using  the standard protocol following bolus administration of intravenous contrast.  CONTRAST:  130m OMNIPAQUE IOHEXOL 300 MG/ML  SOLN  COMPARISON:  06/29/2011.  FINDINGS: Minor atelectasis at the lung bases.  No pericardial or pleural effusion.  No acute osseous abnormality identified. Chronic L5-S1 grade 1 spondylolisthesis and L5 spondylolysis.  No pelvic free fluid. Uterus and adnexa surgically absent. Negative rectum and sigmoid colon except for redundancy. Unremarkable bladder.  Negative left: . Transverse colon within normal limits. Hepatic flexure and right colon within normal limits. Diminutive, normal appendix as before. Oral contrast has not yet reached the terminal ileum. Negative distal small bowel. No dilated or abnormal small bowel loops identified. Distended but otherwise negative stomach. Negative duodenum.  Gallbladder surgically absent. Decreased density throughout the liver in keeping with steatosis. Portal venous system within normal limits. Spleen, pancreas, and adrenal glands within normal limits. Stable and normal kidneys. No hydroureter or periureteral stranding. No abdominal free fluid. No lymphadenopathy or focal inflammatory stranding.  IMPRESSION: Stable abdomen and pelvis since 2012 with no acute or inflammatory process identified. No evidence of appendicitis.  Hepatic steatosis.   Electronically Signed   By: Lars Pinks M.D.   On: 06/21/2013 22:58    EKG Interpretation   None       MDM   1. Abdominal pain    Patient is a 62 year old female with a history of total abdominal hysterectomy and cholecystectomy who presents for right lower quadrant pain with onset yesterday. Patient is well and nontoxic appearing on arrival, hemodynamically stable, and afebrile. Physical exam significant for tenderness to palpation in the right lower corner no McBurney's point. Patient denies nausea and emesis since symptom onset and has had a normal bowel movement today. Labs without leukocytosis or  anemia. Liver and kidney function preserved. Urinalysis nonsuggestive of infection. CT scan ordered for further evaluation of patient's abdominal pain.   Findings without any evidence of appendicitis and no other acute intra-abdominal processes appreciated. Patient's pain well controlled and ED. I discussed patient's imaging results and workup with her. I explained to the patient it is possible that she may have early appendicitis, however there is no evidence to suggest this at this time. Given reassuring workup and improvement in patient's pain, I believe she is appropriate for discharge with primary care followup. I have, however, advised the patient to return to the emergency department should symptoms persist or worsen or should she develop other symptoms such as fever, bloody bowel movements, and nausea or vomiting. Patient agreeable to plan with no unaddressed concerns. I have discussed this patient's care with my attending, Dr. Mingo Amber who is in agreement with her work up, management plan, and stability for d/c today.    Antonietta Breach, PA-C 06/22/13 1948

## 2013-06-21 NOTE — ED Notes (Signed)
Patient transported to CT by Liliane Channel, transporter

## 2013-06-21 NOTE — ED Notes (Signed)
Pt reports RLQ pain that started last night, has rebound tenderness, denies any n/v/d. Last bm this morning.

## 2013-06-24 DIAGNOSIS — E785 Hyperlipidemia, unspecified: Secondary | ICD-10-CM | POA: Diagnosis not present

## 2013-06-24 DIAGNOSIS — G35 Multiple sclerosis: Secondary | ICD-10-CM | POA: Diagnosis not present

## 2013-06-24 DIAGNOSIS — R109 Unspecified abdominal pain: Secondary | ICD-10-CM | POA: Diagnosis not present

## 2013-06-24 DIAGNOSIS — E049 Nontoxic goiter, unspecified: Secondary | ICD-10-CM | POA: Diagnosis not present

## 2013-06-24 DIAGNOSIS — Z6833 Body mass index (BMI) 33.0-33.9, adult: Secondary | ICD-10-CM | POA: Diagnosis not present

## 2013-06-24 DIAGNOSIS — K219 Gastro-esophageal reflux disease without esophagitis: Secondary | ICD-10-CM | POA: Diagnosis not present

## 2013-06-24 NOTE — ED Provider Notes (Signed)
Medical screening examination/treatment/procedure(s) were performed by non-physician practitioner and as supervising physician I was immediately available for consultation/collaboration.  EKG Interpretation   None          Osvaldo Shipper, MD 06/24/13 [phone]

## 2013-07-22 DIAGNOSIS — Z79899 Other long term (current) drug therapy: Secondary | ICD-10-CM | POA: Diagnosis not present

## 2013-08-18 DIAGNOSIS — Z79899 Other long term (current) drug therapy: Secondary | ICD-10-CM | POA: Diagnosis not present

## 2013-08-20 HISTORY — PX: ROTATOR CUFF REPAIR: SHX139

## 2013-09-24 DIAGNOSIS — G35 Multiple sclerosis: Secondary | ICD-10-CM | POA: Diagnosis not present

## 2013-09-24 DIAGNOSIS — G4700 Insomnia, unspecified: Secondary | ICD-10-CM | POA: Diagnosis not present

## 2013-09-24 DIAGNOSIS — R269 Unspecified abnormalities of gait and mobility: Secondary | ICD-10-CM | POA: Diagnosis not present

## 2013-09-24 DIAGNOSIS — R42 Dizziness and giddiness: Secondary | ICD-10-CM | POA: Diagnosis not present

## 2013-12-25 ENCOUNTER — Encounter: Payer: Self-pay | Admitting: Women's Health

## 2013-12-25 ENCOUNTER — Ambulatory Visit (INDEPENDENT_AMBULATORY_CARE_PROVIDER_SITE_OTHER): Payer: Medicare Other | Admitting: Women's Health

## 2013-12-25 VITALS — BP 120/84 | Ht 63.0 in | Wt 193.0 lb

## 2013-12-25 DIAGNOSIS — N946 Dysmenorrhea, unspecified: Secondary | ICD-10-CM | POA: Diagnosis not present

## 2013-12-25 DIAGNOSIS — Z1231 Encounter for screening mammogram for malignant neoplasm of breast: Secondary | ICD-10-CM | POA: Diagnosis not present

## 2013-12-25 DIAGNOSIS — N952 Postmenopausal atrophic vaginitis: Secondary | ICD-10-CM

## 2013-12-25 DIAGNOSIS — E785 Hyperlipidemia, unspecified: Secondary | ICD-10-CM | POA: Diagnosis not present

## 2013-12-25 DIAGNOSIS — Z803 Family history of malignant neoplasm of breast: Secondary | ICD-10-CM | POA: Diagnosis not present

## 2013-12-25 DIAGNOSIS — E049 Nontoxic goiter, unspecified: Secondary | ICD-10-CM | POA: Diagnosis not present

## 2013-12-25 NOTE — Progress Notes (Signed)
[facility] 1950-12-16 [id number]    History:    Presents for breast and pelvic exam. 1999 TAH with BSO for endometriosis on no HRT. 1977 cervical dysplasia with normal Paps after. 2014 normal DEXA T score -0.7 right hip. 2013 benign colon polyps.  vitamin D low in the past, 2014 vitamin D 71 currently on supplement. On disability for MS. Normal mammogram history. Not sexually active.  Past medical history, past surgical history, family history and social history were all reviewed and documented in the EPIC chart. Previously worked [employer] and Dr Joya Salm. Husband Alzheimer's. 1 adopted daughter. 64 yo mother lives with her.  ROS:  A  12 point ROS was performed and pertinent positives and negatives are included.  Exam:  Filed Vitals:   12/25/13 1359  BP: 120/84    General appearance:  Normal Thyroid:  Symmetrical, normal in size, without palpable masses or nodularity. Respiratory  Auscultation:  Clear without wheezing or rhonchi Cardiovascular  Auscultation:  Regular rate, without rubs, murmurs or gallops  Edema/varicosities:  Not grossly evident Abdominal  Soft,nontender, without masses, guarding or rebound.  Liver/spleen:  No organomegaly noted  Hernia:  None appreciated  Skin  Inspection:  Grossly normal   Breasts: Examined lying and sitting.     Right: Without masses, retractions, discharge or axillary adenopathy.     Left: Without masses, retractions, discharge or axillary adenopathy. Gentitourinary   Inguinal/mons:  Normal without inguinal adenopathy  External genitalia:  Normal  BUS/Urethra/Skene's glands:  Normal  Vagina:  Atrophic  Cervix:  Absent  Uterus: Absent Adnexa/parametria:     Rt: Without masses or tenderness.   Lt: Without masses or tenderness.  Anus and perineum: Normal  Digital rectal exam: Normal sphincter tone without palpated masses or tenderness  Assessment/Plan:  63 y.o. MWF G0 for breast and pelvic exam.  Hypothyroid -  primary care manages labs and meds 1999 TAH with BSO on no HRT Multiple sclerosis Asymptomatic vaginal atrophy  Plan: Vitamin D 2000 daily continue calcium rich diet, increase regular weight bearing exercises able. SBE's, continue annual screening mammogram, home safety and fall prevention discussed.    Flippin, 3:00 PM 12/25/2013

## 2013-12-25 NOTE — Patient Instructions (Signed)
Health Recommendations for Postmenopausal Women Respected and ongoing research has looked at the most common causes of death, disability, and poor quality of life in postmenopausal women. The causes include heart disease, diseases of blood vessels, diabetes, depression, cancer, and bone loss (osteoporosis). Many things can be done to help lower the chances of developing these and other common problems: CARDIOVASCULAR DISEASE Heart Disease: A heart attack is a medical emergency. Know the signs and symptoms of a heart attack. Below are things women can do to reduce their risk for heart disease.   Do not smoke. If you smoke, quit.  Aim for a healthy weight. Being overweight causes many preventable deaths. Eat a healthy and balanced diet and drink an adequate amount of liquids.  Get moving. Make a commitment to be more physically active. Aim for 30 minutes of activity on most, if not all days of the week.  Eat for heart health. Choose a diet that is low in saturated fat and cholesterol and eliminate trans fat. Include whole grains, vegetables, and fruits. Read and understand the labels on food containers before buying.  Know your numbers. Ask your caregiver to check your blood pressure, cholesterol (total, HDL, LDL, triglycerides) and blood glucose. Work with your caregiver on improving your entire clinical picture.  High blood pressure. Limit or stop your table salt intake (try salt substitute and food seasonings). Avoid salty foods and drinks. Read labels on food containers before buying. Eating well and exercising can help control high blood pressure. STROKE  Stroke is a medical emergency. Stroke may be the result of a blood clot in a blood vessel in the brain or by a brain hemorrhage (bleeding). Know the signs and symptoms of a stroke. To lower the risk of developing a stroke:  Avoid fatty foods.  Quit smoking.  Control your diabetes, blood pressure, and irregular heart rate. THROMBOPHLEBITIS  (BLOOD CLOT) OF THE LEG  Becoming overweight and leading a stationary lifestyle may also contribute to developing blood clots. Controlling your diet and exercising will help lower the risk of developing blood clots. CANCER SCREENING  Breast Cancer: Take steps to reduce your risk of breast cancer.  You should practice "breast self-awareness." This means understanding the normal appearance and feel of your breasts and should include breast self-examination. Any changes detected, no matter how small, should be reported to your caregiver.  After age 46, you should have a clinical breast exam (CBE) every year.  Starting at age 93, you should consider having a mammogram (breast X-ray) every year.  If you have a family history of breast cancer, talk to your caregiver about genetic screening.  If you are at high risk for breast cancer, talk to your caregiver about having an MRI and a mammogram every year.  Intestinal or Stomach Cancer: Tests to consider are a rectal exam, fecal occult blood, sigmoidoscopy, and colonoscopy. Women who are high risk may need to be screened at an earlier age and more often.  Cervical Cancer:  Beginning at age 18, you should have a Pap test every 3 years as long as the past 3 Pap tests have been normal.  If you have had past treatment for cervical cancer or a condition that could lead to cancer, you need Pap tests and screening for cancer for at least 20 years after your treatment.  If you had a hysterectomy for a problem that was not cancer or a condition that could lead to cancer, then you no longer need Pap tests.  If you are between ages 38 and 63, and you have had normal Pap tests going back 10 years, you no longer need Pap tests.  If Pap tests have been discontinued, risk factors (such as a new sexual partner) need to be reassessed to determine if screening should be resumed.  Some medical problems can increase the chance of getting cervical cancer. In these  cases, your caregiver may recommend more frequent screening and Pap tests.  Uterine Cancer: If you have vaginal bleeding after reaching menopause, you should notify your caregiver.  Ovarian cancer: Other than yearly pelvic exams, there are no reliable tests available to screen for ovarian cancer at this time except for yearly pelvic exams.  Lung Cancer: Yearly chest X-rays can detect lung cancer and should be done on high risk women, such as cigarette smokers and women with chronic lung disease (emphysema).  Skin Cancer: A complete body skin exam should be done at your yearly examination. Avoid overexposure to the sun and ultraviolet light lamps. Use a strong sun block cream when in the sun. All of these things are important in lowering the risk of skin cancer. MENOPAUSE Menopause Symptoms: Hormone therapy products are effective for treating symptoms associated with menopause:  Moderate to severe hot flashes.  Night sweats.  Mood swings.  Headaches.  Tiredness.  Loss of sex drive.  Insomnia.  Other symptoms. Hormone replacement carries certain risks, especially in older women. Women who use or are thinking about using estrogen or estrogen with progestin treatments should discuss that with their caregiver. Your caregiver will help you understand the benefits and risks. The ideal dose of hormone replacement therapy is not known. The Food and Drug Administration (FDA) has concluded that hormone therapy should be used only at the lowest doses and for the shortest amount of time to reach treatment goals.  OSTEOPOROSIS Protecting Against Bone Loss and Preventing Fracture: If you use hormone therapy for prevention of bone loss (osteoporosis), the risks for bone loss must outweigh the risk of the therapy. Ask your caregiver about other medications known to be safe and effective for preventing bone loss and fractures. To guard against bone loss or fractures, the following is recommended:  If  you are less than age 24, take 1000 mg of calcium and at least 600 mg of Vitamin D per day.  If you are greater than age 32 but less than age 70, take 1200 mg of calcium and at least 600 mg of Vitamin D per day.  If you are greater than age 27, take 1200 mg of calcium and at least 800 mg of Vitamin D per day. Smoking and excessive alcohol intake increases the risk of osteoporosis. Eat foods rich in calcium and vitamin D and do weight bearing exercises several times a week as your caregiver suggests. DIABETES Diabetes Melitus: If you have Type I or Type 2 diabetes, you should keep your blood sugar under control with diet, exercise and recommended medication. Avoid too many sweets, starchy and fatty foods. Being overweight can make control more difficult. COGNITION AND MEMORY Cognition and Memory: Menopausal hormone therapy is not recommended for the prevention of cognitive disorders such as Alzheimer's disease or memory loss.  DEPRESSION  Depression may occur at any age, but is common in elderly women. The reasons may be because of physical, medical, social (loneliness), or financial problems and needs. If you are experiencing depression because of medical problems and control of symptoms, talk to your caregiver about this. Physical activity and  exercise may help with mood and sleep. Community and volunteer involvement may help your sense of value and worth. If you have depression and you feel that the problem is getting worse or becoming severe, talk to your caregiver about treatment options that are best for you. ACCIDENTS  Accidents are common and can be serious in the elderly woman. Prepare your house to prevent accidents. Eliminate throw rugs, place hand bars in the bath, shower and toilet areas. Avoid wearing high heeled shoes or walking on wet, snowy, and icy areas. Limit or stop driving if you have vision or hearing problems, or you feel you are unsteady with you movements and  reflexes. HEPATITIS C Hepatitis C is a type of viral infection affecting the liver. It is spread mainly through contact with blood from an infected person. It can be treated, but if left untreated, it can lead to severe liver damage over years. Many people who are infected do not know that the virus is in their blood. If you are a "baby-boomer", it is recommended that you have one screening test for Hepatitis C. IMMUNIZATIONS  Several immunizations are important to consider having during your senior years, including:   Tetanus, diptheria, and pertussis booster shot.  Influenza every year before the flu season begins.  Pneumonia vaccine.  Shingles vaccine.  Others as indicated based on your specific needs. Talk to your caregiver about these. Document Released: 09/28/2005 Document Revised: 07/23/2012 Document Reviewed: 05/24/2008 [facility] Patient Information 2014 [place].

## 2013-12-28 ENCOUNTER — Encounter: Payer: Self-pay | Admitting: Gynecology

## 2013-12-30 DIAGNOSIS — G35 Multiple sclerosis: Secondary | ICD-10-CM | POA: Diagnosis not present

## 2013-12-30 DIAGNOSIS — R109 Unspecified abdominal pain: Secondary | ICD-10-CM | POA: Diagnosis not present

## 2013-12-30 DIAGNOSIS — Z6833 Body mass index (BMI) 33.0-33.9, adult: Secondary | ICD-10-CM | POA: Diagnosis not present

## 2013-12-30 DIAGNOSIS — K219 Gastro-esophageal reflux disease without esophagitis: Secondary | ICD-10-CM | POA: Diagnosis not present

## 2013-12-30 DIAGNOSIS — M4310 Spondylolisthesis, site unspecified: Secondary | ICD-10-CM | POA: Diagnosis not present

## 2013-12-30 DIAGNOSIS — Z0000 Encounter for general adult medical examination without abnormal findings: Secondary | ICD-10-CM | POA: Diagnosis not present

## 2013-12-30 DIAGNOSIS — E785 Hyperlipidemia, unspecified: Secondary | ICD-10-CM | POA: Diagnosis not present

## 2013-12-30 DIAGNOSIS — M5137 Other intervertebral disc degeneration, lumbosacral region: Secondary | ICD-10-CM | POA: Diagnosis not present

## 2013-12-30 DIAGNOSIS — Z79899 Other long term (current) drug therapy: Secondary | ICD-10-CM | POA: Diagnosis not present

## 2013-12-30 DIAGNOSIS — E049 Nontoxic goiter, unspecified: Secondary | ICD-10-CM | POA: Diagnosis not present

## 2014-02-03 DIAGNOSIS — R1031 Right lower quadrant pain: Secondary | ICD-10-CM | POA: Diagnosis not present

## 2014-02-09 DIAGNOSIS — G35 Multiple sclerosis: Secondary | ICD-10-CM | POA: Diagnosis not present

## 2014-02-17 DIAGNOSIS — M545 Low back pain, unspecified: Secondary | ICD-10-CM | POA: Diagnosis not present

## 2014-02-18 ENCOUNTER — Encounter (INDEPENDENT_AMBULATORY_CARE_PROVIDER_SITE_OTHER): Payer: Medicare Other | Admitting: Ophthalmology

## 2014-02-25 ENCOUNTER — Encounter (INDEPENDENT_AMBULATORY_CARE_PROVIDER_SITE_OTHER): Payer: Medicare Other | Admitting: Ophthalmology

## 2014-03-03 ENCOUNTER — Encounter (INDEPENDENT_AMBULATORY_CARE_PROVIDER_SITE_OTHER): Payer: Medicare Other | Admitting: Ophthalmology

## 2014-03-03 DIAGNOSIS — H35349 Macular cyst, hole, or pseudohole, unspecified eye: Secondary | ICD-10-CM

## 2014-03-03 DIAGNOSIS — H469 Unspecified optic neuritis: Secondary | ICD-10-CM | POA: Diagnosis not present

## 2014-03-03 DIAGNOSIS — H35379 Puckering of macula, unspecified eye: Secondary | ICD-10-CM | POA: Diagnosis not present

## 2014-03-03 DIAGNOSIS — H2510 Age-related nuclear cataract, unspecified eye: Secondary | ICD-10-CM

## 2014-03-03 DIAGNOSIS — H43819 Vitreous degeneration, unspecified eye: Secondary | ICD-10-CM

## 2014-03-05 DIAGNOSIS — H6120 Impacted cerumen, unspecified ear: Secondary | ICD-10-CM | POA: Diagnosis not present

## 2014-04-28 DIAGNOSIS — M545 Low back pain, unspecified: Secondary | ICD-10-CM | POA: Diagnosis not present

## 2014-05-02 ENCOUNTER — Encounter (HOSPITAL_COMMUNITY): Payer: Self-pay | Admitting: Emergency Medicine

## 2014-05-02 ENCOUNTER — Emergency Department (HOSPITAL_COMMUNITY)
Admission: EM | Admit: 2014-05-02 | Discharge: 2014-05-03 | Disposition: A | Discharge status: Home / Self Care | Payer: Medicare Other | Attending: Emergency Medicine | Admitting: Emergency Medicine

## 2014-05-02 DIAGNOSIS — M79609 Pain in unspecified limb: Secondary | ICD-10-CM | POA: Diagnosis not present

## 2014-05-02 DIAGNOSIS — IMO0002 Reserved for concepts with insufficient information to code with codable children: Secondary | ICD-10-CM | POA: Diagnosis not present

## 2014-05-02 DIAGNOSIS — E7800 Pure hypercholesterolemia, unspecified: Secondary | ICD-10-CM | POA: Insufficient documentation

## 2014-05-02 DIAGNOSIS — Z7982 Long term (current) use of aspirin: Secondary | ICD-10-CM | POA: Insufficient documentation

## 2014-05-02 DIAGNOSIS — M79662 Pain in left lower leg: Secondary | ICD-10-CM

## 2014-05-02 DIAGNOSIS — Z87410 Personal history of cervical dysplasia: Secondary | ICD-10-CM | POA: Insufficient documentation

## 2014-05-02 DIAGNOSIS — Z79899 Other long term (current) drug therapy: Secondary | ICD-10-CM | POA: Diagnosis not present

## 2014-05-02 DIAGNOSIS — Z8742 Personal history of other diseases of the female genital tract: Secondary | ICD-10-CM | POA: Diagnosis not present

## 2014-05-02 DIAGNOSIS — Z87891 Personal history of nicotine dependence: Secondary | ICD-10-CM | POA: Insufficient documentation

## 2014-05-02 DIAGNOSIS — Z8719 Personal history of other diseases of the digestive system: Secondary | ICD-10-CM | POA: Diagnosis not present

## 2014-05-02 DIAGNOSIS — G35 Multiple sclerosis: Secondary | ICD-10-CM | POA: Diagnosis not present

## 2014-05-02 DIAGNOSIS — E039 Hypothyroidism, unspecified: Secondary | ICD-10-CM | POA: Diagnosis not present

## 2014-05-02 MED ORDER — OXYCODONE-ACETAMINOPHEN 5-325 MG PO TABS
1.0000 | ORAL_TABLET | Freq: Once | ORAL | Status: AC
  Administered 2014-05-03: 1 via ORAL
  Filled 2014-05-02: qty 1

## 2014-05-02 NOTE — ED Notes (Signed)
The pt is c/o lt calf painsince Wednesday.  She saw her ortho doctor and then called her neurologist who called in a  Muscle relaxer for her.  The pain is no better but is getting worse.  Hx ms

## 2014-05-02 NOTE — ED Provider Notes (Signed)
CSN: [id number]     Arrival date & time 05/02/14  2235 History   First MD Initiated Contact with Patient 05/02/14 2329     Chief Complaint  Patient presents with  . Leg Pain     (Consider location/radiation/quality/duration/timing/severity/associated sxs/prior Treatment) HPI  63 year old female with history of multiple sclerosis, celiac disease, hypothyroidism presents for evaluations of left calf pain. Patient reports gradual onset of pain to the left calf which she described as a cramping sensation, persistent, worsening with ambulation. Pain is 8/10. No associated fever, chest pain, shortness of breath, productive cough, hemoptysis, back pain, numbness, weakness. No recent trauma, denies any recent surgery, prolonged bed rest, active cancer. She reports she saw orthopedic Dr. and that also, neurologist for this complaint in which she was given muscle relaxant, Flexeril, and has been taking steroids for the past for 5 days with minimal improvement of symptoms. No prior history of PE or DVT.  Past Medical History  Diagnosis Date  . Multiple sclerosis   . Endometriosis   . Hypothyroidism   . Cervical dysplasia   . Celiac disease   . High cholesterol    Past Surgical History  Procedure Laterality Date  . Gynecologic cryosurgery  1987  . Cholecystectomy    . Rotator cuff repair    . Finger surgery    . Abdominal hysterectomy  1999    TAH,BSO menorrhgia,,endometriosis  . Colposcopy    . Oophorectomy     Family History  Problem Relation Age of Onset  . Breast cancer Mother     age 62's  . Cancer Mother     UTERINE  . Diabetes Father   . Hypertension Father   . Heart disease Father   . Cancer Maternal Grandfather     Stomach cancer  . Heart disease Paternal Grandfather    History  Substance Use Topics  . Smoking status: Former Research scientist (life sciences)  . Smokeless tobacco: Not on file  . Alcohol Use: No   OB History   Grav Para Term Preterm Abortions TAB SAB Ect Mult Living   0               Review of Systems  All other systems reviewed and are negative.     Allergies  Erythromycin and Morphine and related  Home Medications   Prior to Admission medications   Medication Sig Start Date End Date Taking? Authorizing Provider  aspirin 81 MG tablet Take 162 mg by mouth 2 (two) times daily. Took 2 tabs this morning, 1 tab around lunch time, then 2 more tablets around 4pm.    Historical Provider, MD  Cholecalciferol (VITAMIN D-3) 5000 UNITS TABS Take 1 tablet by mouth daily.     Historical Provider, MD  FLUoxetine (PROZAC) 20 MG capsule Take 20 mg by mouth daily.      Historical Provider, MD  lansoprazole (PREVACID) 30 MG capsule Take 60 mg by mouth daily.     Historical Provider, MD  levothyroxine (SYNTHROID, LEVOTHROID) 112 MCG tablet Take 112 mcg by mouth daily.      Historical Provider, MD  pravastatin (PRAVACHOL) 40 MG tablet Take 20 mg by mouth daily.     Historical Provider, MD  Teriflunomide (AUBAGIO) 14 MG TABS Take 14 mg by mouth daily.    Historical Provider, MD  vitamin E 400 UNIT capsule Take 400 Units by mouth daily.      Historical Provider, MD  zolpidem (AMBIEN) 10 MG tablet Take 5-10 mg by mouth at bedtime as  needed for sleep.    Historical Provider, MD   BP 139/71  Pulse 92  Temp(Src) 98.3 F (36.8 C)  Resp 20  Ht 5' 4.5" (1.638 m)  Wt 191 lb (86.637 kg)  BMI 32.29 kg/m2  SpO2 99%  LMP 08/20/1997 Physical Exam  Nursing note and vitals reviewed. Constitutional: She appears well-developed and well-nourished. No distress.  HENT:  Head: Atraumatic.  Eyes: Conjunctivae are normal.  Neck: Neck supple.  Cardiovascular: Normal rate and regular rhythm.   Pulmonary/Chest: Effort normal and breath sounds normal. She has no wheezes. She has no rales.  Musculoskeletal:  Bilateral lower extremities without palpable cords, erythema, edema. Tenderness to left calf on palpation.  Neurological: She is alert.  Skin: No rash noted.  Psychiatric: She has a  normal mood and affect.    ED Course  Procedures (including critical care time)  11:57 PM Patient with history of MS presents with left calf pain. Pain is reproducible on exam however she has no evidence of your edema, palpable cords, or other significant finding to suggest DVT. Plan to obtain a d-dimer, check renal function and give pain medication. Care discussed with Dr. Dina Rich.    1:20 AM Normal renal function, d-dimer is negative.  Low suspicion for DVT.  RICE therapy discussed, pain medication provided.  Recommend f/u with her PCP or neurologist for further evaluation. r eturn precaution discussed.    Labs Review Labs Reviewed  D-DIMER, QUANTITATIVE  I-STAT CREATININE, ED    Imaging Review No results found.   EKG Interpretation None      MDM   Final diagnoses:  Calf pain, left    BP 132/64  Pulse 78  Temp(Src) 98.3 F (36.8 C)  Resp 20  Ht 5' 4.5" (1.638 m)  Wt 191 lb (86.637 kg)  BMI 32.29 kg/m2  SpO2 93%  LMP 08/20/1997  I have reviewed nursing notes and vital signs. I reviewed available ER/hospitalization records thought the EMR     Domenic Moras, [place] 05/03/14 0121

## 2014-05-03 DIAGNOSIS — M79609 Pain in unspecified limb: Secondary | ICD-10-CM | POA: Diagnosis not present

## 2014-05-03 LAB — D-DIMER, QUANTITATIVE (NOT AT ARMC): D-Dimer, Quant: <0.27 ug/mL-FEU (ref 0.00–0.48)

## 2014-05-03 LAB — I-STAT CREATININE, ED: CREATININE: 0.8 mg/dL (ref 0.50–1.10)

## 2014-05-03 MED ORDER — OXYCODONE-ACETAMINOPHEN 5-325 MG PO TABS: 1.0000 | ORAL_TABLET | Freq: Four times a day (QID) | ORAL | Status: DC | PRN

## 2014-05-03 MED ORDER — OXYCODONE-ACETAMINOPHEN 5-325 MG PO TABS
1.0000 | ORAL_TABLET | Freq: Once | ORAL | Status: AC
  Administered 2014-05-03: 1 via ORAL
  Filled 2014-05-03: qty 1

## 2014-05-03 NOTE — Discharge Instructions (Signed)
Please follow up with your neurologist or your primary care doctor for further evaluation of your left calf pain.  At this time no obvious sign of infection, or blood clot to account for your pain.  Take pain medication as needed.  Return if your condition worsen.

## 2014-05-03 NOTE — ED Provider Notes (Signed)
Medical screening examination/treatment/procedure(s) were performed by non-physician practitioner and as supervising physician I was immediately available for consultation/collaboration.   EKG Interpretation None        Merryl Hacker, MD 05/03/14 1538

## 2014-05-05 DIAGNOSIS — M79609 Pain in unspecified limb: Secondary | ICD-10-CM | POA: Diagnosis not present

## 2014-05-11 DIAGNOSIS — IMO0002 Reserved for concepts with insufficient information to code with codable children: Secondary | ICD-10-CM | POA: Diagnosis not present

## 2014-05-13 DIAGNOSIS — Z23 Encounter for immunization: Secondary | ICD-10-CM | POA: Diagnosis not present

## 2014-05-24 DIAGNOSIS — M5416 Radiculopathy, lumbar region: Secondary | ICD-10-CM | POA: Diagnosis not present

## 2014-05-24 DIAGNOSIS — M62831 Muscle spasm of calf: Secondary | ICD-10-CM | POA: Diagnosis not present

## 2014-05-27 DIAGNOSIS — G35 Multiple sclerosis: Secondary | ICD-10-CM | POA: Diagnosis not present

## 2014-05-27 DIAGNOSIS — R5383 Other fatigue: Secondary | ICD-10-CM | POA: Diagnosis not present

## 2014-05-27 DIAGNOSIS — R252 Cramp and spasm: Secondary | ICD-10-CM | POA: Diagnosis not present

## 2014-06-20 ENCOUNTER — Emergency Department (HOSPITAL_COMMUNITY): Payer: Medicare Other

## 2014-06-20 ENCOUNTER — Encounter (HOSPITAL_COMMUNITY): Payer: Self-pay | Admitting: Emergency Medicine

## 2014-06-20 ENCOUNTER — Observation Stay (HOSPITAL_COMMUNITY)
Admission: EM | Admit: 2014-06-20 | Discharge: 2014-06-21 | Disposition: A | Discharge status: Home / Self Care | Payer: Medicare Other | Attending: Internal Medicine | Admitting: Internal Medicine

## 2014-06-20 DIAGNOSIS — Z79899 Other long term (current) drug therapy: Secondary | ICD-10-CM | POA: Insufficient documentation

## 2014-06-20 DIAGNOSIS — E039 Hypothyroidism, unspecified: Secondary | ICD-10-CM | POA: Insufficient documentation

## 2014-06-20 DIAGNOSIS — R42 Dizziness and giddiness: Secondary | ICD-10-CM | POA: Diagnosis not present

## 2014-06-20 DIAGNOSIS — Z87891 Personal history of nicotine dependence: Secondary | ICD-10-CM | POA: Diagnosis not present

## 2014-06-20 DIAGNOSIS — G35 Multiple sclerosis: Secondary | ICD-10-CM | POA: Diagnosis not present

## 2014-06-20 DIAGNOSIS — Z7982 Long term (current) use of aspirin: Secondary | ICD-10-CM | POA: Insufficient documentation

## 2014-06-20 DIAGNOSIS — Z8742 Personal history of other diseases of the female genital tract: Secondary | ICD-10-CM | POA: Insufficient documentation

## 2014-06-20 DIAGNOSIS — R110 Nausea: Secondary | ICD-10-CM | POA: Diagnosis not present

## 2014-06-20 DIAGNOSIS — R2689 Other abnormalities of gait and mobility: Secondary | ICD-10-CM | POA: Diagnosis not present

## 2014-06-20 DIAGNOSIS — E049 Nontoxic goiter, unspecified: Secondary | ICD-10-CM | POA: Diagnosis not present

## 2014-06-20 DIAGNOSIS — K900 Celiac disease: Secondary | ICD-10-CM | POA: Insufficient documentation

## 2014-06-20 DIAGNOSIS — R112 Nausea with vomiting, unspecified: Secondary | ICD-10-CM | POA: Diagnosis not present

## 2014-06-20 DIAGNOSIS — E780 Pure hypercholesterolemia: Secondary | ICD-10-CM | POA: Diagnosis not present

## 2014-06-20 DIAGNOSIS — R404 Transient alteration of awareness: Secondary | ICD-10-CM | POA: Diagnosis not present

## 2014-06-20 DIAGNOSIS — E785 Hyperlipidemia, unspecified: Secondary | ICD-10-CM | POA: Diagnosis not present

## 2014-06-20 LAB — CBC
HCT: 40.3 % (ref 36.0–46.0)
Hemoglobin: 13.7 g/dL (ref 12.0–15.0)
MCH: 29.2 pg (ref 26.0–34.0)
MCHC: 34 g/dL (ref 30.0–36.0)
MCV: 85.9 fL (ref 78.0–100.0)
PLATELETS: 206 10*3/uL (ref 150–400)
RBC: 4.69 MIL/uL (ref 3.87–5.11)
RDW: 13.3 % (ref 11.5–15.5)
WBC: 3.5 10*3/uL — AB (ref 4.0–10.5)

## 2014-06-20 LAB — I-STAT CHEM 8, ED
BUN: 11 mg/dL (ref 6–23)
Calcium, Ion: 0.92 mmol/L — ABNORMAL LOW (ref 1.13–1.30)
Chloride: 103 mEq/L (ref 96–112)
Creatinine, Ser: 0.6 mg/dL (ref 0.50–1.10)
Glucose, Bld: 107 mg/dL — ABNORMAL HIGH (ref 70–99)
HCT: 42 % (ref 36.0–46.0)
HEMOGLOBIN: 14.3 g/dL (ref 12.0–15.0)
Potassium: 3.7 mEq/L (ref 3.7–5.3)
SODIUM: 136 meq/L — AB (ref 137–147)
TCO2: 26 mmol/L (ref 0–100)

## 2014-06-20 LAB — COMPREHENSIVE METABOLIC PANEL
ALT: 40 U/L — AB (ref 0–35)
AST: 32 U/L (ref 0–37)
Albumin: 3.8 g/dL (ref 3.5–5.2)
Alkaline Phosphatase: 59 U/L (ref 39–117)
Anion gap: 14 (ref 5–15)
BILIRUBIN TOTAL: 0.4 mg/dL (ref 0.3–1.2)
BUN: 12 mg/dL (ref 6–23)
CHLORIDE: 99 meq/L (ref 96–112)
CO2: 24 meq/L (ref 19–32)
CREATININE: 0.58 mg/dL (ref 0.50–1.10)
Calcium: 9.3 mg/dL (ref 8.4–10.5)
GLUCOSE: 106 mg/dL — AB (ref 70–99)
Potassium: 4.2 mEq/L (ref 3.7–5.3)
Sodium: 137 mEq/L (ref 137–147)
Total Protein: 6.7 g/dL (ref 6.0–8.3)

## 2014-06-20 LAB — URINALYSIS, ROUTINE W REFLEX MICROSCOPIC
BILIRUBIN URINE: NEGATIVE
GLUCOSE, UA: NEGATIVE mg/dL
Hgb urine dipstick: NEGATIVE
KETONES UR: 40 mg/dL — AB
Leukocytes, UA: NEGATIVE
Nitrite: NEGATIVE
PROTEIN: NEGATIVE mg/dL
Specific Gravity, Urine: 1.01 (ref 1.005–1.030)
Urobilinogen, UA: 0.2 mg/dL (ref 0.0–1.0)
pH: 7.5 (ref 5.0–8.0)

## 2014-06-20 MED ORDER — PRAVASTATIN SODIUM 20 MG PO TABS
20.0000 mg | ORAL_TABLET | Freq: Every day | ORAL | Status: DC
  Administered 2014-06-21: 20 mg via ORAL
  Filled 2014-06-20: qty 1

## 2014-06-20 MED ORDER — TERIFLUNOMIDE 14 MG PO TABS: 14.0000 mg | ORAL_TABLET | Freq: Every day | ORAL | Status: DC

## 2014-06-20 MED ORDER — VITAMIN D 1000 UNITS PO TABS
5000.0000 [IU] | ORAL_TABLET | ORAL | Status: DC
  Administered 2014-06-21: 5000 [IU] via ORAL
  Filled 2014-06-20: qty 5

## 2014-06-20 MED ORDER — FLUOXETINE HCL 20 MG PO CAPS
20.0000 mg | ORAL_CAPSULE | Freq: Every day | ORAL | Status: DC
  Administered 2014-06-21: 20 mg via ORAL
  Filled 2014-06-20 (×2): qty 1

## 2014-06-20 MED ORDER — LORAZEPAM 2 MG/ML IJ SOLN
1.0000 mg | Freq: Once | INTRAMUSCULAR | Status: AC
Start: 2014-06-20 — End: 2014-06-20
  Administered 2014-06-20: 1 mg via INTRAVENOUS
  Filled 2014-06-20: qty 1

## 2014-06-20 MED ORDER — SODIUM CHLORIDE 0.9 % IV SOLN
INTRAVENOUS | Status: DC
  Administered 2014-06-20: 11:00:00 via INTRAVENOUS
  Administered 2014-06-21: 1000 mL via INTRAVENOUS
  Administered 2014-06-21: 04:00:00 via INTRAVENOUS

## 2014-06-20 MED ORDER — METHYLPREDNISOLONE SODIUM SUCC 125 MG IJ SOLR
125.0000 mg | Freq: Two times a day (BID) | INTRAMUSCULAR | Status: DC
  Administered 2014-06-20 – 2014-06-21 (×2): 125 mg via INTRAVENOUS
  Filled 2014-06-20 (×3): qty 2

## 2014-06-20 MED ORDER — MECLIZINE HCL 25 MG PO TABS
25.0000 mg | ORAL_TABLET | Freq: Once | ORAL | Status: AC
  Administered 2014-06-20: 25 mg via ORAL
  Filled 2014-06-20: qty 1

## 2014-06-20 MED ORDER — ACETAMINOPHEN 650 MG RE SUPP: 650.0000 mg | Freq: Four times a day (QID) | RECTAL | Status: DC | PRN

## 2014-06-20 MED ORDER — ASPIRIN EC 81 MG PO TBEC
81.0000 mg | DELAYED_RELEASE_TABLET | Freq: Every day | ORAL | Status: DC
  Administered 2014-06-21: 81 mg via ORAL
  Filled 2014-06-20 (×2): qty 1

## 2014-06-20 MED ORDER — DIAZEPAM 5 MG PO TABS
5.0000 mg | ORAL_TABLET | Freq: Four times a day (QID) | ORAL | Status: DC | PRN
  Administered 2014-06-21: 5 mg via ORAL
  Filled 2014-06-20: qty 1

## 2014-06-20 MED ORDER — LEVOTHYROXINE SODIUM 112 MCG PO TABS
112.0000 ug | ORAL_TABLET | Freq: Every day | ORAL | Status: DC
  Administered 2014-06-21: 112 ug via ORAL
  Filled 2014-06-20 (×2): qty 1

## 2014-06-20 MED ORDER — ZOLPIDEM TARTRATE 5 MG PO TABS
5.0000 mg | ORAL_TABLET | Freq: Every day | ORAL | Status: DC
  Administered 2014-06-20: 5 mg via ORAL
  Filled 2014-06-20: qty 1

## 2014-06-20 MED ORDER — PANTOPRAZOLE SODIUM 40 MG PO TBEC
40.0000 mg | DELAYED_RELEASE_TABLET | Freq: Every day | ORAL | Status: DC
  Administered 2014-06-21: 40 mg via ORAL
  Filled 2014-06-20: qty 1

## 2014-06-20 MED ORDER — METHYLPREDNISOLONE SODIUM SUCC 125 MG IJ SOLR
125.0000 mg | Freq: Once | INTRAMUSCULAR | Status: AC
  Administered 2014-06-20: 125 mg via INTRAVENOUS
  Filled 2014-06-20: qty 2

## 2014-06-20 MED ORDER — ENOXAPARIN SODIUM 40 MG/0.4ML ~~LOC~~ SOLN
40.0000 mg | SUBCUTANEOUS | Status: DC
Start: 2014-06-20 — End: 2014-06-21
  Filled 2014-06-20 (×2): qty 0.4

## 2014-06-20 MED ORDER — ACETAMINOPHEN 325 MG PO TABS
650.0000 mg | ORAL_TABLET | Freq: Four times a day (QID) | ORAL | Status: DC | PRN
Start: 2014-06-20 — End: 2014-06-21
  Administered 2014-06-20: 650 mg via ORAL
  Filled 2014-06-20: qty 2

## 2014-06-20 MED ORDER — MECLIZINE HCL 25 MG PO TABS
25.0000 mg | ORAL_TABLET | Freq: Three times a day (TID) | ORAL | Status: DC | PRN
  Filled 2014-06-20: qty 1

## 2014-06-20 MED ORDER — OXYCODONE-ACETAMINOPHEN 5-325 MG PO TABS
1.0000 | ORAL_TABLET | Freq: Four times a day (QID) | ORAL | Status: DC | PRN
  Administered 2014-06-21 (×2): 1 via ORAL
  Filled 2014-06-20 (×2): qty 1

## 2014-06-20 MED ORDER — CYCLOBENZAPRINE HCL 10 MG PO TABS: 5.0000 mg | ORAL_TABLET | Freq: Three times a day (TID) | ORAL | Status: DC | PRN

## 2014-06-20 MED ORDER — VITAMIN E 180 MG (400 UNIT) PO CAPS
400.0000 [IU] | ORAL_CAPSULE | Freq: Every day | ORAL | Status: DC
  Administered 2014-06-21: 400 [IU] via ORAL
  Filled 2014-06-20 (×2): qty 1

## 2014-06-20 MED ORDER — ONDANSETRON HCL 4 MG/2ML IJ SOLN: 4.0000 mg | Freq: Four times a day (QID) | INTRAMUSCULAR | Status: DC | PRN

## 2014-06-20 MED ORDER — GADOBENATE DIMEGLUMINE 529 MG/ML IV SOLN
18.0000 mL | Freq: Once | INTRAVENOUS | Status: AC | PRN
  Administered 2014-06-20: 18 mL via INTRAVENOUS

## 2014-06-20 MED ORDER — TERIFLUNOMIDE 14 MG PO TABS
14.0000 mg | ORAL_TABLET | Freq: Every day | ORAL | Status: DC
  Administered 2014-06-20 – 2014-06-21 (×2): 14 mg via ORAL

## 2014-06-20 NOTE — ED Provider Notes (Signed)
CSN: [id number]     Arrival date & time 06/20/14  1036 History   First MD Initiated Contact with Patient 06/20/14 1046     Chief Complaint  Patient presents with  . Dizziness     (Consider location/radiation/quality/duration/timing/severity/associated sxs/prior Treatment) HPI Comments: Patient here with son onset of dizziness which began this morning when she woke up. Symptoms are worse when she moves her head to the left or right. She noticed some trouble walking. Has some tingling to her bilateral arms. History of similar symptoms in the past and was diagnosed with Mnire's disease. Denies any ear pain at this time. No recent URI symptoms. Denies any headache. She's had nausea but no vomiting. No slurred speech or confusion. Symptoms are worse with movement and better with remaining still. She took Valium prior to arrival without relief  Patient is a 63 y.o. female presenting with dizziness. The history is provided by the patient and the spouse.  Dizziness   Past Medical History  Diagnosis Date  . Multiple sclerosis   . Endometriosis   . Hypothyroidism   . Cervical dysplasia   . Celiac disease   . High cholesterol    Past Surgical History  Procedure Laterality Date  . Gynecologic cryosurgery  1987  . Cholecystectomy    . Rotator cuff repair    . Finger surgery    . Abdominal hysterectomy  1999    TAH,BSO menorrhgia,,endometriosis  . Colposcopy    . Oophorectomy     Family History  Problem Relation Age of Onset  . Breast cancer Mother     age 39's  . Cancer Mother     UTERINE  . Diabetes Father   . Hypertension Father   . Heart disease Father   . Cancer Maternal Grandfather     Stomach cancer  . Heart disease Paternal Grandfather    History  Substance Use Topics  . Smoking status: Former Research scientist (life sciences)  . Smokeless tobacco: Not on file  . Alcohol Use: No   OB History    Gravida Para Term Preterm AB TAB SAB Ectopic Multiple Living   0              Review of  Systems  Neurological: Positive for dizziness.  All other systems reviewed and are negative.     Allergies  Erythromycin and Morphine and related  Home Medications   Prior to Admission medications   Medication Sig Start Date End Date Taking? Authorizing Provider  aspirin 81 MG tablet Take 81 mg by mouth daily.    Yes Historical Provider, MD  Cholecalciferol (VITAMIN D-3) 5000 UNITS TABS Take 1 tablet by mouth every other day.    Yes Historical Provider, MD  Cyclobenzaprine HCl (FLEXERIL PO) Take 1 tablet by mouth 3 (three) times daily as needed (muscle spasms).   Yes Historical Provider, MD  diazepam (VALIUM) 5 MG tablet Take 5 mg by mouth every 6 (six) hours as needed for anxiety.   Yes Historical Provider, MD  FLUoxetine (PROZAC) 20 MG capsule Take 20 mg by mouth daily.     Yes Historical Provider, MD  lansoprazole (PREVACID) 30 MG capsule Take 30 mg by mouth daily.    Yes Historical Provider, MD  levothyroxine (SYNTHROID, LEVOTHROID) 112 MCG tablet Take 112 mcg by mouth daily.     Yes Historical Provider, MD  oxyCODONE-acetaminophen (PERCOCET/ROXICET) 5-325 MG per tablet Take 1 tablet by mouth every 6 (six) hours as needed for severe pain. 05/03/14  Yes Domenic Moras,  PA-C  pravastatin (PRAVACHOL) 40 MG tablet Take 20 mg by mouth daily.    Yes Historical Provider, MD  Teriflunomide (AUBAGIO) 14 MG TABS Take 14 mg by mouth daily.   Yes Historical Provider, MD  vitamin E 400 UNIT capsule Take 400 Units by mouth daily.     Yes Historical Provider, MD  zolpidem (AMBIEN) 10 MG tablet Take 10 mg by mouth at bedtime.    Yes Historical Provider, MD  predniSONE (STERAPRED UNI-PAK) 5 MG TABS tablet Take 5-10 mg by mouth See admin instructions. Day 1: Two tablets before breakfast, one after lunch, one after dinner, and two at bedtime. If started late in the day, take two tablets every hour for three hours, unless otherwise directed by prescriber.  Day 2: One tablet before breakfast, one after lunch,  one after dinner, and two at bedtime  Day 3: One tablet before breakfast, one after lunch, one after dinner, and one at bedtime  Day 4: One tablet before breakfast, one after lunch, and one at bedtime  Day 5: One tablet before breakfast and one at bedtime  Day 6: One tablet before breakfast    Historical Provider, MD   BP 115/72 mmHg  Pulse 78  Temp(Src) 98.3 F (36.8 C) (Oral)  Resp 20  SpO2 96%  LMP 08/20/1997 Physical Exam  Constitutional: She is oriented to person, place, and time. She appears well-developed and well-nourished.  Non-toxic appearance. No distress.  HENT:  Head: Normocephalic and atraumatic.  Eyes: Conjunctivae, EOM and lids are normal. Pupils are equal, round, and reactive to light.  Neck: Normal range of motion. Neck supple. No tracheal deviation present. No thyroid mass present.  Cardiovascular: Normal rate, regular rhythm and normal heart sounds.  Exam reveals no gallop.   No murmur heard. Pulmonary/Chest: Effort normal and breath sounds normal. No stridor. No respiratory distress. She has no decreased breath sounds. She has no wheezes. She has no rhonchi. She has no rales.  Abdominal: Soft. Normal appearance and bowel sounds are normal. She exhibits no distension. There is no tenderness. There is no rebound and no CVA tenderness.  Musculoskeletal: Normal range of motion. She exhibits no edema or tenderness.  Neurological: She is alert and oriented to person, place, and time. She has normal strength. No cranial nerve deficit or sensory deficit. Coordination normal. GCS eye subscore is 4. GCS verbal subscore is 5. GCS motor subscore is 6.  Skin: Skin is warm and dry. No abrasion and no rash noted.  Psychiatric: She has a normal mood and affect. Her speech is normal and behavior is normal.  Nursing note and vitals reviewed.   ED Course  Procedures (including critical care time) Labs Review Labs Reviewed  I-STAT CHEM 8, ED    Imaging Review No results  found.   EKG Interpretation None      MDM   Final diagnoses:  None    Patient given IV fluids, Ativan, meclizine and remains dizzy. She was given Solu-Medrol will be admitted for an exacerbation of her multiple sclerosis    Leota Jacobsen, MD 06/20/14 1329

## 2014-06-20 NOTE — ED Notes (Signed)
Pt reports that she is still dizzy

## 2014-06-20 NOTE — ED Notes (Signed)
Attempted to call report

## 2014-06-20 NOTE — ED Notes (Signed)
Pt from home  via [facility] c/o dizziness that started when she woke up this a.m. Pt reports to EMS she had this same problem 2 years ago and was told it was her MS. 2 doses of 4 mg Zofran given enroute. 20G IV RT AC.

## 2014-06-20 NOTE — ED Notes (Signed)
Pt reports head pressure to the right temple and on top of head but denies pain. She reports that when she open her eyes she is dizzy like the room is spinning. She is alert and oriented. She tells me that when she attempted to get up this morning to walk to kept leaning to her right.

## 2014-06-20 NOTE — ED Notes (Addendum)
She reports the pressure in her head is better.

## 2014-06-20 NOTE — ED Notes (Addendum)
Assisted patient onto Bedside commode. Pt reports lots of dizziness upon sitting and standing. Dizziness is better when she is still.

## 2014-06-20 NOTE — ED Notes (Signed)
Pt transported to MRI 

## 2014-06-20 NOTE — H&P (Signed)
PCP:   Geoffery Lyons, MD   Chief Complaint:  ddizziness with gait instability nausea and vomiting starting this morning  HPI: 63 year old female with past medical history significant for multiple sclerosis followed by Dr. Rachel Moulds with [facility], for multiple decades, currently on immunosuppressive agents for the same, given relapsing remitting multiple sclerosis over the years, however last hospitalization was multiple years ago. Her other past medical history is significant for hypothyroidism and hyperlipidemia along with some gynecological issues.she was seen by her neurologist approximately 2 weeks ago was having some left lower extremity cramping sensations, concerning for an MS flare and she was given an IV dose of steroids during office visit. Since that time has resolved, she has recently joined [employer] and has been gone to the gym and is lost close to 7-10 pounds in several weeks. She denies any baseline issues with neurologic deficits except for some paresthesias affecting the hands left greater than right and her original diagnosis of MS was secondary to significant visual abnormalities which have been quiet over numerous years on current medications. She presents today by EMS actually after experiencing significant gait instability, going to the left, accompanied by vertigo, dizziness, with nausea and vomiting. She took her baseline Valium this morning, she takes every day for muscle spasms, however this did not improve the matter, she denies any hearing deficits near pain, nasal congestion, ear discharge. She states that she had a similar episode approximately 2 years ago however not to the same degree, and was seen by her neurologist who did not think that this was an MS flare and was treated conservatively and thought to be Mnire's disease after ENT evaluation. Currently patient has had an MRI scan which is unremarkable for except for old changes consistent with her MS,  renal parameters are stable, and I was called to admit the patient for an MS flare and Solu-Medrol was  given to the patient 1. Since admission to the emergency room, the patient had a difficult time sitting up or getting to the bathroom as result of her current symptomatology which persist despite using meclizine and Ativan and IV fluids. She will now be admitted for further evaluation and management.  Review of Systems:  Negative for fevers chills positive for some weight loss as result of attempts with weight watchers, and exercise. Denies any headaches, visual abnormalities but does have a history of macular degeneration which is stable, denies any swallowing difficulties, recurrent cough, shortness of breath, wheezing, chest pain, palpitations or presyncope. She denies any nasal congestion, recurrent cough, ear discomfort ear discharge or hearing changes. She specifically denies tinnitus. She denies nausea or vomiting prior to today however significant issues this morning, denies changes in bowel habits denies blood in stool or urine or other bleeding complications from mucosal surfaces. Denies any lower extremity edema, new muscular skeletal issues except for that already mentioned, but does admit to some paresthesias in bilateral distal upper extremities worse this morning.  Past Medical History: Past Medical History  Diagnosis Date  . Multiple sclerosis   . Endometriosis   . Hypothyroidism   . Cervical dysplasia   . Celiac disease   . High cholesterol    Past Surgical History  Procedure Laterality Date  . Gynecologic cryosurgery  1987  . Cholecystectomy    . Rotator cuff repair    . Finger surgery    . Abdominal hysterectomy  1999    TAH,BSO menorrhgia,,endometriosis  . Colposcopy    . Oophorectomy  Medications: Prior to Admission medications   Medication Sig Start Date End Date Taking? Authorizing Provider  aspirin 81 MG tablet Take 81 mg by mouth daily.    Yes Historical  Provider, MD  Cholecalciferol (VITAMIN D-3) 5000 UNITS TABS Take 1 tablet by mouth every other day.    Yes Historical Provider, MD  Cyclobenzaprine HCl (FLEXERIL PO) Take 1 tablet by mouth 3 (three) times daily as needed (muscle spasms).   Yes Historical Provider, MD  diazepam (VALIUM) 5 MG tablet Take 5 mg by mouth every 6 (six) hours as needed for anxiety.   Yes Historical Provider, MD  FLUoxetine (PROZAC) 20 MG capsule Take 20 mg by mouth daily.     Yes Historical Provider, MD  lansoprazole (PREVACID) 30 MG capsule Take 30 mg by mouth daily.    Yes Historical Provider, MD  levothyroxine (SYNTHROID, LEVOTHROID) 112 MCG tablet Take 112 mcg by mouth daily.     Yes Historical Provider, MD  oxyCODONE-acetaminophen (PERCOCET/ROXICET) 5-325 MG per tablet Take 1 tablet by mouth every 6 (six) hours as needed for severe pain. 05/03/14  Yes Domenic Moras, PA-C  pravastatin (PRAVACHOL) 40 MG tablet Take 20 mg by mouth daily.    Yes Historical Provider, MD  Teriflunomide (AUBAGIO) 14 MG TABS Take 14 mg by mouth daily.   Yes Historical Provider, MD  vitamin E 400 UNIT capsule Take 400 Units by mouth daily.     Yes Historical Provider, MD  zolpidem (AMBIEN) 10 MG tablet Take 10 mg by mouth at bedtime.    Yes Historical Provider, MD  predniSONE (STERAPRED UNI-PAK) 5 MG TABS tablet Take 5-10 mg by mouth See admin instructions. Day 1: Two tablets before breakfast, one after lunch, one after dinner, and two at bedtime. If started late in the day, take two tablets every hour for three hours, unless otherwise directed by prescriber.  Day 2: One tablet before breakfast, one after lunch, one after dinner, and two at bedtime  Day 3: One tablet before breakfast, one after lunch, one after dinner, and one at bedtime  Day 4: One tablet before breakfast, one after lunch, and one at bedtime  Day 5: One tablet before breakfast and one at bedtime  Day 6: One tablet before breakfast    Historical Provider, MD    Allergies:    Allergies  Allergen Reactions  . Erythromycin Other (See Comments)    Abdominal cramping  . Morphine And Related     Patient described heaviness and loss of feeling in legs coupled with a squeezing feeling in the epigastric region. Requested no further Morphine doses.       Social History:  reports that she has quit smoking. She does not have any smokeless tobacco history on file. She reports that she does not drink alcohol or use illicit drugs.  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 Breast cancer Mother     age 74's  . Cancer Mother     UTERINE  . Diabetes Father   . Hypertension Father   . Heart disease Father   . Cancer Maternal Grandfather     Stomach cancer  . Heart disease Paternal Grandfather     Physical Exam: Filed Vitals:   06/20/14 1039 06/20/14 1234 06/20/14 1330  BP: 115/72 117/68 105/66  Pulse: 78 75 71  Temp: 98.3 F (36.8 C)    TempSrc: Oral    Resp: 20 20 18   SpO2: 96% 95% 95%    Gen.-no apparent distress lying flat in bed alert and oriented 3 answering all questions appropriately well-developed and well-nourished nontoxic appearance,  Head exam normocephalic atraumatic Pharyngeal lesions, tongue midline, Tympanic membranes clear without effusion, or significant erythema Sclera anicteric extraconal movements are intact no nystagmus of significance detected however patient states that she had significant issues with this by has spends report earlier this morning No cervical lymphadenopathy Lungs clear to auscultation bilaterally when patient able to sit up but briefly Cardiovascular reveals regular rate and rhythm without murmurs rubs or gallops appreciated Abdomen soft, nontender, nondistended bowel sounds present Extremity exam reveals no  edema, pedal pulses intact no cyanosis or clubbing range of motion intact in all 4 extremities Alert and oriented 3 Cranial nerves II through XII grossly intact no sensory deficit Resting tone normal, no resting tremors No apparent rash Alert and oriented 3, no visible distress    Labs on Admission:   Recent Labs  06/20/14 1122  NA 136*  K 3.7  CL 103  GLUCOSE 107*  BUN 11  CREATININE 0.60   No results for input(s): AST, ALT, ALKPHOS, BILITOT, PROT, ALBUMIN in the last 72 hours. No results for input(s): LIPASE, AMYLASE in the last 72 hours.  Recent Labs  06/20/14 1122  HGB 14.3  HCT 42.0   No results for input(s): CKTOTAL, CKMB, CKMBINDEX, TROPONINI in the last 72 hours. No results for input(s): TSH, T4TOTAL, T3FREE, THYROIDAB in the last 72 hours.  Invalid input(s): FREET3 No results for input(s): VITAMINB12, FOLATE, FERRITIN, TIBC, IRON, RETICCTPCT in the last 72 hours.  Radiological Exams on Admission: Mr Kizzie Fantasia Contrast  06/20/2014   CLINICAL DATA:  Dizziness and unsteady gait. History of multiple sclerosis  EXAM: MRI HEAD WITHOUT AND WITH CONTRAST  TECHNIQUE: Multiplanar, multiecho pulse sequences of the brain and surrounding structures were obtained without and with intravenous contrast.  CONTRAST:  18m MULTIHANCE GADOBENATE DIMEGLUMINE 529 MG/ML IV SOLN  COMPARISON:  MRI 12/17/2011  FINDINGS: Periventricular white matter lesions bilaterally in the parietal lobes, similar in distribution to the prior study. Lesions are slightly better seen today likely due to technical factors. No new lesions are identified. Again noted is hyperintensity in the colossal septal interface consistent with demyelinating disease.  Negative for acute infarct. T2 shine through in the left occipital white matter consistent with chronic MS plaque. This is unchanged. No areas of restricted diffusion  Negative for hemorrhage or mass  Normal enhancement following contrast infusion. No plaque  enhancement is noted.  IMPRESSION: Findings consistent with multiple sclerosis, similar to the MRI 2013.  No acute abnormality.   Electronically Signed   By: CFranchot GalloM.D.   On: 06/20/2014 12:48   Orders placed or performed during the hospital encounter of 06/20/14  . EKG 12-Lead  . EKG 12-Lead    Assessment/Plan Vertigo with significant dizziness nausea and vomiting of unclear etiology, HEENT exam unremarkable at this time, no evidence of secondary infection. MRI scan unremarkable for acute changes, question relationship to multiple sclerosis, patient has similar presentation albeit not to the same extent approximately 2 years ago told that she had Mnire's disease however no tinnitus at this time.will continue benzodiazepines IV fluids as well as meclizine. Multiple sclerosis-followed by [facility], is currently on immunosuppressants as above, no recent fevers, chills, evidence of secondary infection,  patient has obtained one dose of IV Solu-Medrol here in the emergency room given the presumed diagnosis of a multiple sclerosis flare, unclear if this is indeed the case, we'll continue steroids until clinical since situation evolves/declares itself. MRI scan reviewed, no change compared with 2 years ago was stable findings of MS.will defer neurology consult to Dr. Joya Salm in the morning Hypothyroidism-on supplementation will continue the same, check TSH Hyperlipidemia-on supplementation will continue the same, check liver function test Muscle crampswwill continue benzodiazepines and muscle relaxers as previously ordered. Currently no resting tremors on exam.   Azalea Cedar R 06/20/2014, 2:51 PM

## 2014-06-20 NOTE — ED Notes (Signed)
Bed: DP32 Expected date: 06/20/14 Expected time: 10:27 AM Means of arrival: Ambulance Comments: dizzy

## 2014-06-21 DIAGNOSIS — R112 Nausea with vomiting, unspecified: Secondary | ICD-10-CM | POA: Diagnosis not present

## 2014-06-21 DIAGNOSIS — R42 Dizziness and giddiness: Secondary | ICD-10-CM | POA: Diagnosis not present

## 2014-06-21 DIAGNOSIS — R2689 Other abnormalities of gait and mobility: Secondary | ICD-10-CM | POA: Diagnosis not present

## 2014-06-21 DIAGNOSIS — G35 Multiple sclerosis: Secondary | ICD-10-CM | POA: Diagnosis not present

## 2014-06-21 LAB — COMPREHENSIVE METABOLIC PANEL
ALT: 36 U/L — ABNORMAL HIGH (ref 0–35)
ANION GAP: 13 (ref 5–15)
AST: 21 U/L (ref 0–37)
Albumin: 3.5 g/dL (ref 3.5–5.2)
Alkaline Phosphatase: 56 U/L (ref 39–117)
BUN: 13 mg/dL (ref 6–23)
CALCIUM: 8.5 mg/dL (ref 8.4–10.5)
CO2: 22 mEq/L (ref 19–32)
CREATININE: 0.49 mg/dL — AB (ref 0.50–1.10)
Chloride: 105 mEq/L (ref 96–112)
GFR calc Af Amer: >90 mL/min (ref >90)
Glucose, Bld: 124 mg/dL — ABNORMAL HIGH (ref 70–99)
Potassium: 3.9 mEq/L (ref 3.7–5.3)
Sodium: 140 mEq/L (ref 137–147)
Total Bilirubin: 0.3 mg/dL (ref 0.3–1.2)
Total Protein: 6.5 g/dL (ref 6.0–8.3)

## 2014-06-21 LAB — CBC
HCT: 39.4 % (ref 36.0–46.0)
Hemoglobin: 13.1 g/dL (ref 12.0–15.0)
MCH: 28.3 pg (ref 26.0–34.0)
MCHC: 33.2 g/dL (ref 30.0–36.0)
MCV: 85.1 fL (ref 78.0–100.0)
PLATELETS: 224 10*3/uL (ref 150–400)
RBC: 4.63 MIL/uL (ref 3.87–5.11)
RDW: 13.1 % (ref 11.5–15.5)
WBC: 3 10*3/uL — AB (ref 4.0–10.5)

## 2014-06-21 LAB — TSH: TSH: 0.042 u[IU]/mL — ABNORMAL LOW (ref 0.350–4.500)

## 2014-06-21 MED ORDER — MECLIZINE HCL 25 MG PO TABS: 25.0000 mg | ORAL_TABLET | Freq: Three times a day (TID) | ORAL | Status: DC | PRN

## 2014-06-21 NOTE — Discharge Summary (Signed)
DISCHARGE SUMMARY  Leslie Ryan  MR#: [id number]  DOB:Aug 18, 1951  Date of Admission: 06/20/2014 Date of Discharge: 06/21/2014  Attending Physician:Leslie Ryan A  [name],[name], MD  Consults:  none  Discharge Diagnoses: Active Problems:   Vertigo   Discharge Medications:   Medication List    STOP taking these medications        predniSONE 5 MG Tabs tablet  Commonly known as:  STERAPRED UNI-PAK      TAKE these medications        aspirin 81 MG tablet  Take 81 mg by mouth daily.     AUBAGIO 14 MG Tabs  Generic drug:  Teriflunomide  Take 14 mg by mouth daily.     diazepam 5 MG tablet  Commonly known as:  VALIUM  Take 5 mg by mouth every 6 (six) hours as needed for anxiety.     FLEXERIL PO  Take 1 tablet by mouth 3 (three) times daily as needed (muscle spasms).     FLUoxetine 20 MG capsule  Commonly known as:  PROZAC  Take 20 mg by mouth daily.     lansoprazole 30 MG capsule  Commonly known as:  PREVACID  Take 30 mg by mouth daily.     levothyroxine 112 MCG tablet  Commonly known as:  SYNTHROID, LEVOTHROID  Take 112 mcg by mouth daily.     meclizine 25 MG tablet  Commonly known as:  ANTIVERT  Take 1 tablet (25 mg total) by mouth 3 (three) times daily as needed for dizziness.     oxyCODONE-acetaminophen 5-325 MG per tablet  Commonly known as:  PERCOCET/ROXICET  Take 1 tablet by mouth every 6 (six) hours as needed for severe pain.     pravastatin 40 MG tablet  Commonly known as:  PRAVACHOL  Take 20 mg by mouth daily.     Vitamin D-3 5000 UNITS Tabs  Take 1 tablet by mouth every other day.     vitamin E 400 UNIT capsule  Take 400 Units by mouth daily.     zolpidem 10 MG tablet  Commonly known as:  AMBIEN  Take 10 mg by mouth at bedtime.        Hospital Procedures: Mr Leslie Ryan Contrast  06/20/2014   CLINICAL DATA:  Dizziness and unsteady gait. History of multiple sclerosis  EXAM: MRI HEAD WITHOUT AND WITH CONTRAST   TECHNIQUE: Multiplanar, multiecho pulse sequences of the brain and surrounding structures were obtained without and with intravenous contrast.  CONTRAST:  49m MULTIHANCE GADOBENATE DIMEGLUMINE 529 MG/ML IV SOLN  COMPARISON:  MRI 12/17/2011  FINDINGS: Periventricular white matter lesions bilaterally in the parietal lobes, similar in distribution to the prior study. Lesions are slightly better seen today likely due to technical factors. No new lesions are identified. Again noted is hyperintensity in the colossal septal interface consistent with demyelinating disease.  Negative for acute infarct. T2 shine through in the left occipital white matter consistent with chronic MS plaque. This is unchanged. No areas of restricted diffusion  Negative for hemorrhage or mass  Normal enhancement following contrast infusion. No plaque enhancement is noted.  IMPRESSION: Findings consistent with multiple sclerosis, similar to the MRI 2013.  No acute abnormality.   Electronically Signed   By: Leslie GalloM.D.   On: 06/20/2014 12:48    History of Present Illness: year-old female with past medical history significant for multiple sclerosis followed by Dr. SRachel Mouldswith [facility], for multiple decades, currently on immunosuppressive agents for the  same, given relapsing remitting multiple sclerosis over the years, however last hospitalization was multiple years ago. Her other past medical history is significant for hypothyroidism and hyperlipidemia along with some gynecological issues.she was seen by her neurologist approximately 2 weeks ago was having some left lower extremity cramping sensations, concerning for an MS flare and she was given an IV dose of steroids during office visit. Since that time has resolved, she has recently joined [employer] and has been gone to the gym and is lost close to 7-10 pounds in several weeks. She denies any baseline issues with neurologic deficits except for some paresthesias  affecting the hands left greater than right and her original diagnosis of MS was secondary to significant visual abnormalities which have been quiet over numerous years on current medications. She presents today by EMS actually after experiencing significant gait instability, going to the left, accompanied by vertigo, dizziness, with nausea and vomiting. She took her baseline Valium this morning, she takes every day for muscle spasms, however this did not improve the matter, she denies any hearing deficits near pain, nasal congestion, ear discharge. She states that she had a similar episode approximately 2 years ago however not to the same degree, and was seen by her neurologist who did not think that this was an MS flare and was treated conservatively and thought to be Mnire's disease after ENT evaluation. Currently patient has had an MRI scan which is unremarkable for except for old changes consistent with her MS, renal parameters are stable, and I was called to admit the patient for an MS flare and Solu-Medrol was given to the patient 1. Since admission to the emergency room, the patient had a difficult time sitting up or getting to the bathroom as result of her current symptomatology which persist despite using meclizine and Ativan and IV fluids. She will now be admitted for further evaluation and management. Hospital Course: Admitted, hydrated, empiric steroids for two doses.  Symptoms rapidly cleared.  Tolerating diet and ambulating.  I did discuss with [facility] who follows closely.  This picture not c/w MS exacerbation and most c/w peripheral vestibular picture.  DC home-further outp  Day of Discharge Exam BP 133/75 mmHg  Pulse 68  Temp(Src) 97.9 F (36.6 C) (Oral)  Resp 16  Ht 5' 3.5" (1.613 m)  Wt 83.9 kg (184 lb 15.5 oz)  BMI 32.25 kg/m2  SpO2 97%  LMP 08/20/1997  Physical Exam: General appearance: alert, cooperative and no distress Eyes: no scleral icterus Throat:  oropharynx moist without erythema Resp: clear to auscultation bilaterally Cardio: regular rate and rhythm, S1, S2 normal, no murmur, click, rub or gallop Extremities: no clubbing, cyanosis or edema Neuro normal  Discharge Labs:  Recent Labs  06/20/14 1100 06/20/14 1122 06/21/14 0452  NA 137 136* 140  K 4.2 3.7 3.9  CL 99 103 105  CO2 24  --  22  GLUCOSE 106* 107* 124*  BUN 12 11 13   CREATININE 0.58 0.60 0.49*  CALCIUM 9.3  --  8.5    Recent Labs  06/20/14 1100 06/21/14 0452  AST 32 21  ALT 40* 36*  ALKPHOS 59 56  BILITOT 0.4 0.3  PROT 6.7 6.5  ALBUMIN 3.8 3.5    Recent Labs  06/20/14 1100 06/20/14 1122 06/21/14 0452  WBC 3.5*  --  3.0*  HGB 13.7 14.3 13.1  HCT 40.3 42.0 39.4  MCV 85.9  --  85.1  PLT 206  --  224   No  results for input(s): CKTOTAL, CKMB, CKMBINDEX, TROPONINI in the last 72 hours.  Recent Labs  06/21/14 0452  TSH 0.042*   No results for input(s): VITAMINB12, FOLATE, FERRITIN, TIBC, IRON, RETICCTPCT in the last 72 hours.  Discharge instructions:     Discharge Instructions    Diet - low sodium heart healthy    Complete by:  As directed      Increase activity slowly    Complete by:  As directed            Disposition:home  Follow-up Appts: Follow-up with Dr. Reynaldo Minium at [facility] in 1 week.  Call for appointment.  Condition on Discharge: stable  Tests Needing Follow-up: None--follow with neuro  Signed: Steele Stracener A 06/21/2014, 3:14 PM

## 2014-06-21 NOTE — Care Management Note (Signed)
CARE MANAGEMENT NOTE 06/21/2014  Patient:  Leslie Ryan, Leslie Ryan   Account Number:  [id number]  Date Initiated:  06/21/2014  Documentation initiated by:  Marney Doctor  Subjective/Objective Assessment:   63 yo admitted with Vertigo.  Hx of MS     Action/Plan:   From home with spouse.   Anticipated DC Date:  06/23/2014   Anticipated DC Plan:  [place]  CM consult      Choice offered to / List presented to:             Status of service:  In process, will continue to follow Medicare Important Message given?   (If response is "NO", the following Medicare IM given date fields will be blank) Date Medicare IM given:   Medicare IM given by:   Date Additional Medicare IM given:   Additional Medicare IM given by:    Discharge Disposition:    Per UR Regulation:  Reviewed for med. necessity/level of care/duration of stay  If discussed at [place] of Stay Meetings, dates discussed:    Comments:  06/21/14 Marney Doctor RN,BSN,NCM Chart reviewed and Cm following for DC needs.

## 2014-06-21 NOTE — Progress Notes (Signed)
UR completed 

## 2014-06-24 DIAGNOSIS — G35 Multiple sclerosis: Secondary | ICD-10-CM | POA: Diagnosis not present

## 2014-06-24 DIAGNOSIS — R5383 Other fatigue: Secondary | ICD-10-CM | POA: Diagnosis not present

## 2014-06-24 DIAGNOSIS — R260 Ataxic gait: Secondary | ICD-10-CM | POA: Diagnosis not present

## 2014-06-24 DIAGNOSIS — H8102 Meniere's disease, left ear: Secondary | ICD-10-CM | POA: Diagnosis not present

## 2014-07-27 DIAGNOSIS — E785 Hyperlipidemia, unspecified: Secondary | ICD-10-CM | POA: Diagnosis not present

## 2014-07-27 DIAGNOSIS — Z6830 Body mass index (BMI) 30.0-30.9, adult: Secondary | ICD-10-CM | POA: Diagnosis not present

## 2014-07-27 DIAGNOSIS — K219 Gastro-esophageal reflux disease without esophagitis: Secondary | ICD-10-CM | POA: Diagnosis not present

## 2014-07-27 DIAGNOSIS — G35 Multiple sclerosis: Secondary | ICD-10-CM | POA: Diagnosis not present

## 2014-09-15 ENCOUNTER — Encounter: Payer: Self-pay | Admitting: Neurology

## 2014-09-15 ENCOUNTER — Ambulatory Visit (INDEPENDENT_AMBULATORY_CARE_PROVIDER_SITE_OTHER): Payer: Medicare Other | Admitting: Neurology

## 2014-09-15 VITALS — BP 146/94 | HR 80 | Resp 14 | Ht 63.5 in | Wt 166.8 lb

## 2014-09-15 DIAGNOSIS — G35 Multiple sclerosis: Secondary | ICD-10-CM | POA: Diagnosis not present

## 2014-09-15 DIAGNOSIS — R42 Dizziness and giddiness: Secondary | ICD-10-CM

## 2014-09-15 DIAGNOSIS — R5382 Chronic fatigue, unspecified: Secondary | ICD-10-CM | POA: Insufficient documentation

## 2014-09-15 DIAGNOSIS — R260 Ataxic gait: Secondary | ICD-10-CM | POA: Insufficient documentation

## 2014-09-15 MED ORDER — DIAZEPAM 5 MG PO TABS: 5.0000 mg | ORAL_TABLET | Freq: Every day | ORAL | Status: DC

## 2014-09-15 MED ORDER — ZOLPIDEM TARTRATE 10 MG PO TABS: 10.0000 mg | ORAL_TABLET | Freq: Every day | ORAL | Status: DC

## 2014-09-15 MED ORDER — TRIAMTERENE-HCTZ 37.5-25 MG PO CAPS: 1.0000 | ORAL_CAPSULE | Freq: Every day | ORAL | Status: DC

## 2014-09-15 MED ORDER — TERIFLUNOMIDE 14 MG PO TABS: 14.0000 mg | ORAL_TABLET | Freq: Every day | ORAL | Status: DC

## 2014-09-15 NOTE — Progress Notes (Signed)
[facility]  PATIENT: Leslie Ryan DOB: Feb 12, 1951  REFERRING CLINICIAN: Burnard Bunting HISTORY FROM: patient REASON FOR VISIT: MS   HISTORICAL  CHIEF COMPLAINT:  Chief Complaint  Patient presents with  . Multiple Sclerosis    Denies new or worsening sx. Needs r/f of Diazepam, Ambien, and Aubagio./fim    HISTORY OF PRESENT ILLNESS:  Leslie Ryan is a 64 year old woman with multiple sclerosis. Mid 1980s presented with right optic neuritis while working at [employer] neurology. A lumbar puncture was inconclusive but an MRI of the brain was consistent with MS a few years later. In the 1980s and early 1990s, she received multiple rounds of IV steroids for occasional exacerbations involving vision or gait. In 1994, she started Betaseron stopped quickly due to severe injection site reactions. In January 1997, after an exacerbation she was started on Avonex and continued on Avonex for the next 15 years or so. She had some issues with compliance on some occasions.   In the late 1990s, she began to have more permanent gait issues the right foot drop that slowly worsened. I saw early MRI reports from 2000 showing 2 foci at C6 and C7 on the cervical spine and predominantly periventricular foci on brain MRI.  On 02/20/2013, she had the sudden onset of vertigo that was not altered by movements. Her gait worsened with ataxia and she felt like she was being pushed to the right as she walked. She did not note any numbness or weakness or visual changes. An MRI of the brain on 02/25/2013 showed multiple periventricular white matter lesions consistent with MS. She received 3 days of IV steroids with benefit. I personally reviewed the MRI and felt that there were 3 new lesions in the left frontal parietal out of that were not present on her prior MRI dated 08/08/2010 the foci enhanced. Due to the exacerbation and the MRI changes, we made a decision to switch her from Avonex to Aubagio.  She has had no definite exacerbations well on Aubagio. She tolerates it well and liver function tests have been fine.  She has several symptoms that are related to her MS including increased muscle tone in her legs with associated ataxic and spastic gait.   She notes mild weakness and spasticity in her legs. Diazepam has helped a little bit with this spasticity. She used to be on Flexeril but that had not helped.  The spasticity in her legs are associated with decrease gait. If she exercises her legs get very weak and she has more trouble walking.  She denies any significant difficulty with her bladder. Vision is fine most days.  She also has fatigue.  She feels her fatigue is more physical than mental. She denies any significant cognitive issues but sometimes feels that she is not as good with some cognitive tasks as she used to be. She has had some insomnia that is helped by Ambien. She has had some depression that has responded well to fluoxetine. She also occasionally takes diazepam for anxiety.     She was recently switched from Synthroid to levothyroxin. Since then, she has felt much colder. She also feels that there is a thyroid nodule. She is followed by Dr. Reynaldo Minium for her thyroid issues.  She has had some episodes of vertigo that are probably more related to Mnire's disease and to her multiple sclerosis. Her most recent episode of vertigo occurred the first week of November 2015.   She had an MRI of the brain with  and without contrast performed at [facility] when she presented to the emergency room. I personally reviewed the images and compared to an MRI from April 2013. He has predominantly periventricular foci consistent with the diagnosis of multiple sclerosis and a couple of juxtacortical foci. There has been no change between the 2013 and the 2015 MRI. There were no enhancing lesions on either MRI.    I added triamterene-HCTZ and she has not had any further episodes of  vertigo.    REVIEW OF SYSTEMS:  Constitutional: No fevers, chills, sweats, or change in appetite Eyes: No visual changes, double vision, eye pain Ear, nose and throat: No hearing loss, ear pain, nasal congestion, sore throat Cardiovascular: No chest pain, palpitations Respiratory:  No shortness of breath at rest or with exertion.   No wheezes GastrointestinaI: No nausea, vomiting, diarrhea, abdominal pain, fecal incontinence Genitourinary:  No dysuria, urinary retention or frequency.  No nocturia. Musculoskeletal:  No neck pain, back pain Integumentary: No rash, pruritus, skin lesions Neurological: as above Psychiatric: No depression at this time.  No anxiety Endocrine: No palpitations, diaphoresis, change in appetite, change in weigh or increased thirst.   She has felt cold since thyroid med's changed Hematologic/Lymphatic:  No anemia, purpura, petechiae. Allergic/Immunologic: No itchy/runny eyes, nasal congestion, recent allergic reactions, rashes  ALLERGIES: Allergies  Allergen Reactions  . Erythromycin Other (See Comments)    Abdominal cramping  . Morphine And Related     Patient described heaviness and loss of feeling in legs coupled with a squeezing feeling in the epigastric region. Requested no further Morphine doses.       HOME MEDICATIONS: Outpatient Prescriptions Prior to Visit  Medication Sig Dispense Refill  . aspirin 81 MG tablet Take 81 mg by mouth daily.     . Cholecalciferol (VITAMIN D-3) 5000 UNITS TABS Take 1 tablet by mouth every other day.     . diazepam (VALIUM) 5 MG tablet Take 5 mg by mouth every 6 (six) hours as needed for anxiety.    Marland Kitchen FLUoxetine (PROZAC) 20 MG capsule Take 20 mg by mouth daily.      . lansoprazole (PREVACID) 30 MG capsule Take 30 mg by mouth daily.     Marland Kitchen levothyroxine (SYNTHROID, LEVOTHROID) 112 MCG tablet Take 112 mcg by mouth daily.      . pravastatin (PRAVACHOL) 40 MG tablet Take 20 mg by mouth daily.     . Teriflunomide (AUBAGIO)  14 MG TABS Take 14 mg by mouth daily.    . vitamin E 400 UNIT capsule Take 400 Units by mouth daily.      Marland Kitchen zolpidem (AMBIEN) 10 MG tablet Take 10 mg by mouth at bedtime.     . Cyclobenzaprine HCl (FLEXERIL PO) Take 1 tablet by mouth 3 (three) times daily as needed (muscle spasms).    . meclizine (ANTIVERT) 25 MG tablet Take 1 tablet (25 mg total) by mouth 3 (three) times daily as needed for dizziness. (Patient not taking: Reported on 09/15/2014) 30 tablet 0  . oxyCODONE-acetaminophen (PERCOCET/ROXICET) 5-325 MG per tablet Take 1 tablet by mouth every 6 (six) hours as needed for severe pain. (Patient not taking: Reported on 09/15/2014) 6 tablet 0   No facility-administered medications prior to visit.    PAST MEDICAL HISTORY: Past Medical History  Diagnosis Date  . Multiple sclerosis   . Endometriosis   . Hypothyroidism   . Cervical dysplasia   . Celiac disease   . High cholesterol   . Hearing loss   .  Vision abnormalities     PAST SURGICAL HISTORY: Past Surgical History  Procedure Laterality Date  . Gynecologic cryosurgery  1987  . Cholecystectomy    . Rotator cuff repair    . Finger surgery    . Abdominal hysterectomy  1999    TAH,BSO menorrhgia,,endometriosis  . Colposcopy    . Oophorectomy    . Abdominal hysterectomy    . Cholecystectomy    . Rotator cuff repair Right     FAMILY HISTORY: Family History  Problem Relation Age of Onset  . Breast cancer Mother     age 17's  . Cancer Mother     UTERINE  . Diabetes Father   . Hypertension Father   . Heart disease Father   . Cancer Maternal Grandfather     Stomach cancer  . Heart disease Paternal Grandfather     SOCIAL HISTORY:  History   Social History  . Marital Status: Married    Spouse Name: N/A    Number of Children: N/A  . Years of Education: N/A   Occupational History  . Not on file.   Social History Main Topics  . Smoking status: Former Research scientist (life sciences)  . Smokeless tobacco: Not on file  . Alcohol Use: No   . Drug Use: No  . Sexual Activity: No   Other Topics Concern  . Not on file   Social History Narrative     PHYSICAL EXAM  Filed Vitals:   09/15/14 1416  BP: 146/94  Pulse: 80  Resp: 14  Height: 5' 3.5" (1.613 m)  Weight: 166 lb 12.8 oz (75.66 kg)    Body mass index is 29.08 kg/(m^2).   General: The patient is well-developed and well-nourished and in no acute distress  Eyes:  Funduscopic exam shows normal optic discs and retinal vessels.  Neck: The neck is supple, no carotid bruits are noted.  The neck is nontender.  Respiratory: The respiratory examination is clear.  Cardiovascular: The cardiovascular examination reveals a regular rate and rhythm, no murmurs, gallops or rubs are noted.  Skin: Extremities are without significant edema.  Neurologic Exam  Mental status: The patient is alert and oriented x 3 at the time of the examination. The patient has apparent normal recent and remote memory, with an apparently normal attention span and concentration ability.   Speech is normal.  Cranial nerves: Extraocular movements are full. Pupils are equal, round, and reactive to light and accomodation.  Visual fields are full.  Facial symmetry is present. There is good facial sensation to soft touch bilaterally.Facial strength is normal.  Trapezius and sternocleidomastoid strength is normal. No dysarthria is noted.  The tongue is midline, and the patient has symmetric elevation of the soft palate. No obvious hearing deficits are noted.  Motor:  Muscle bulk is normal but tone is increased. Strength is  5 / 5 in all 4 extremities.   Sensory: Sensory testing is intact to pinprick, soft touch, vibration sensation, and position sense on all 4 extremities.  Coordination: Cerebellar testing reveals good finger-nose-finger and heel-to-shin bilaterally.  Gait and station: Station and gait are normal. Tandem gait is wide. Romberg is negative.   Reflexes: Deep tendon reflexes are  symmetric and normal bilaterally. Plantar responses are normal.    DIAGNOSTIC DATA (LABS, IMAGING, TESTING) - I reviewed patient records, labs, notes, testing and imaging myself where available.  Lab Results  Component Value Date   WBC 3.0* 06/21/2014   HGB 13.1 06/21/2014   HCT 39.4 06/21/2014  MCV 85.1 06/21/2014   PLT 224 06/21/2014      Component Value Date/Time   NA 140 06/21/2014 0452   K 3.9 06/21/2014 0452   CL 105 06/21/2014 0452   CO2 22 06/21/2014 0452   GLUCOSE 124* 06/21/2014 0452   BUN 13 06/21/2014 0452   CREATININE 0.49* 06/21/2014 0452   CALCIUM 8.5 06/21/2014 0452   PROT 6.5 06/21/2014 0452   ALBUMIN 3.5 06/21/2014 0452   AST 21 06/21/2014 0452   ALT 36* 06/21/2014 0452   ALKPHOS 56 06/21/2014 0452   BILITOT 0.3 06/21/2014 0452   GFRNONAA >90 06/21/2014 0452   GFRAA >90 06/21/2014 0452   No results found for: CHOL, HDL, LDLCALC, LDLDIRECT, TRIG, CHOLHDL No results found for: HGBA1C No results found for: VITAMINB12 Lab Results  Component Value Date   TSH 0.042* 06/21/2014        ASSESSMENT AND PLAN  Multiple sclerosis  Vertigo  Chronic fatigue  Ataxic gait  In summary, Leslie Ryan is 64 year old woman with relapsing remitting multiple sclerosis who is currently stable on Aubagio therapy. She has from her MS including a slightly widened gait and fatigue.  She is advised to remain active.   She also has had episodes of severe vertigo with nausea and vomiting that likely represents Mnire's syndrome. Hearing is mildly asymmetric. Last year I changed her diuretic therapy around and she has not had any further episodes yet. Medications were refilled at this visit.  She will return to see me in about 4 or 5 months, sooner if she has new or worsening neurologic symptoms.    Leslie Ryan A. Felecia Shelling, MD, PhD 12/20/8880, 8:00 PM Certified in Neurology, Clinical Neurophysiology, Sleep Medicine, Pain Medicine and Neuroimaging  [facility] [address], [place], [place] [address] [phone]

## 2014-09-16 DIAGNOSIS — E785 Hyperlipidemia, unspecified: Secondary | ICD-10-CM | POA: Diagnosis not present

## 2014-09-16 DIAGNOSIS — E049 Nontoxic goiter, unspecified: Secondary | ICD-10-CM | POA: Diagnosis not present

## 2014-09-16 DIAGNOSIS — G35 Multiple sclerosis: Secondary | ICD-10-CM | POA: Diagnosis not present

## 2014-09-16 DIAGNOSIS — E039 Hypothyroidism, unspecified: Secondary | ICD-10-CM | POA: Diagnosis not present

## 2014-09-20 IMAGING — CR DG CHEST 2V
2 series · 2 of 2 positions shown · non-contrast
Comparison: 05/04/2011

CLINICAL DATA: Chest pain, chronic cough

CHEST - 2 VIEW

[w chest pa]
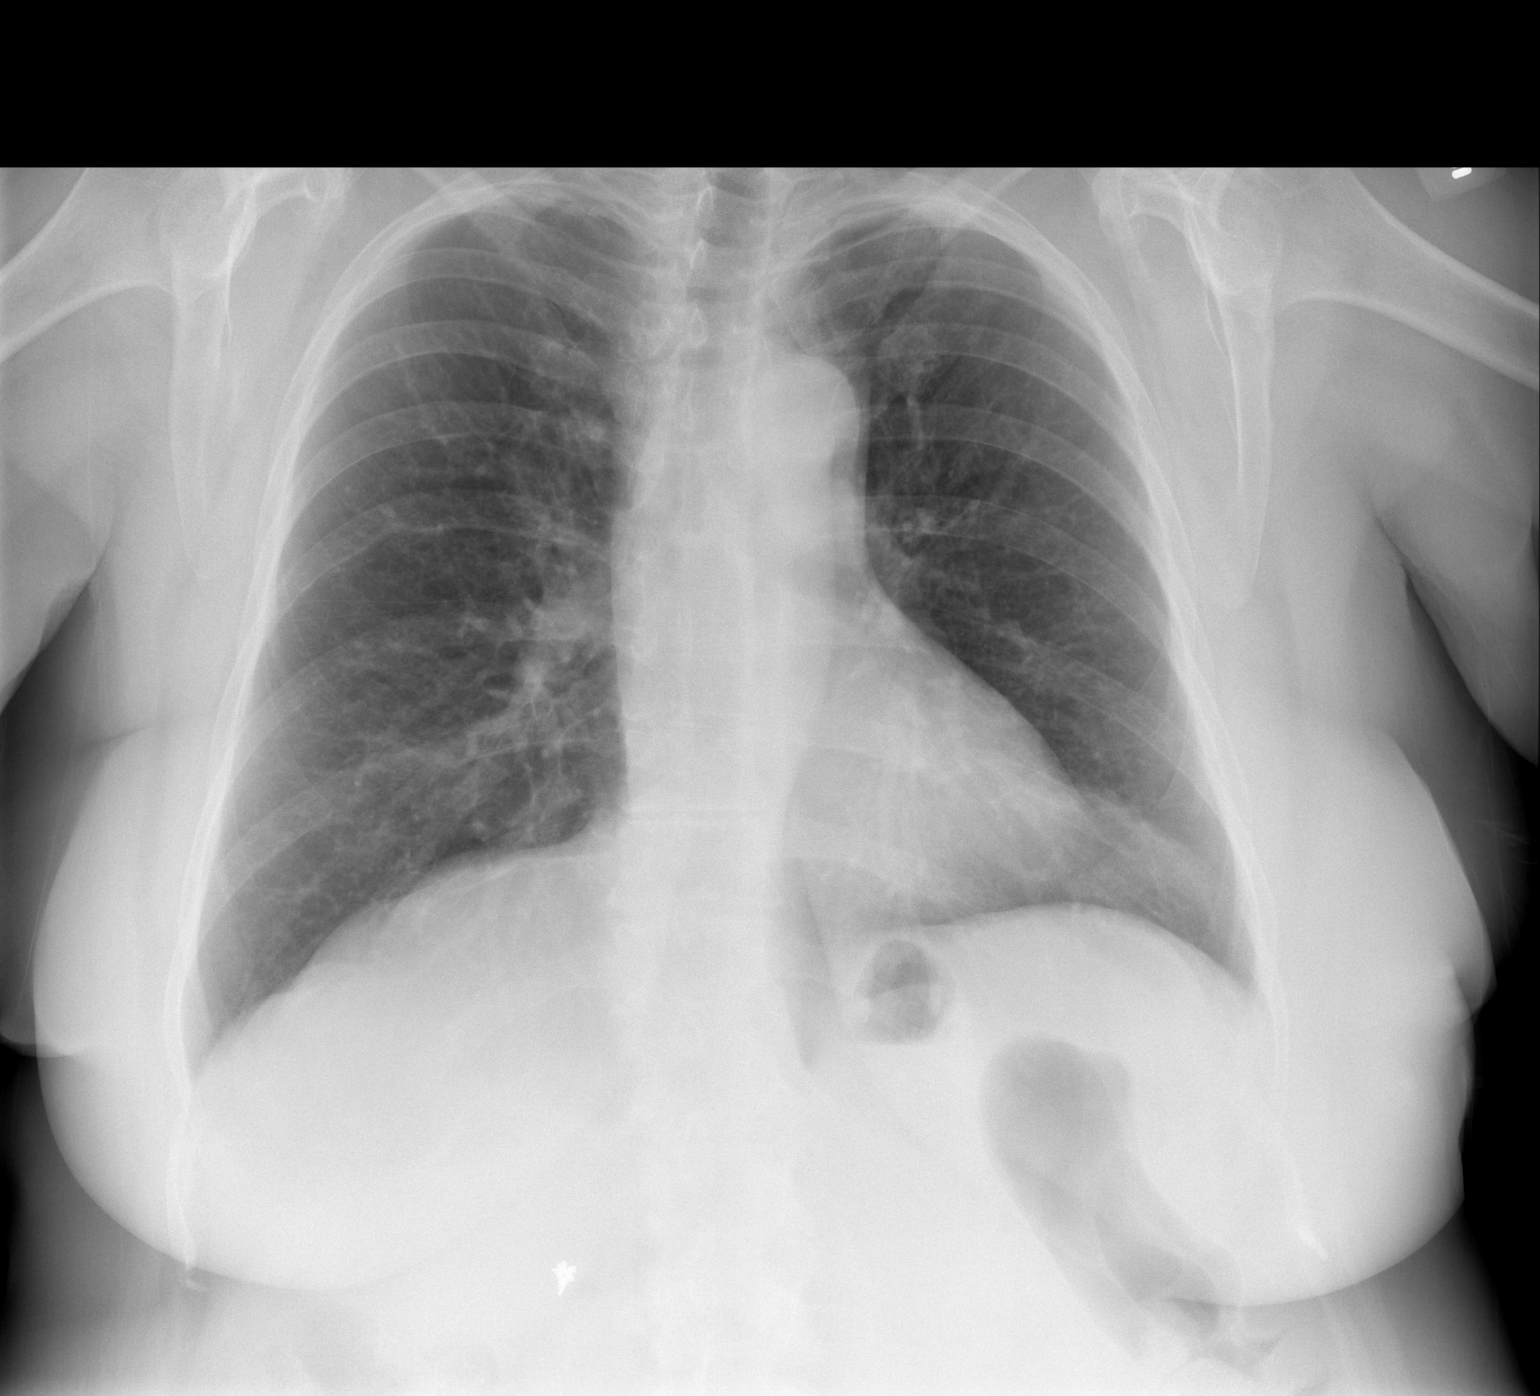

[w chest lat]
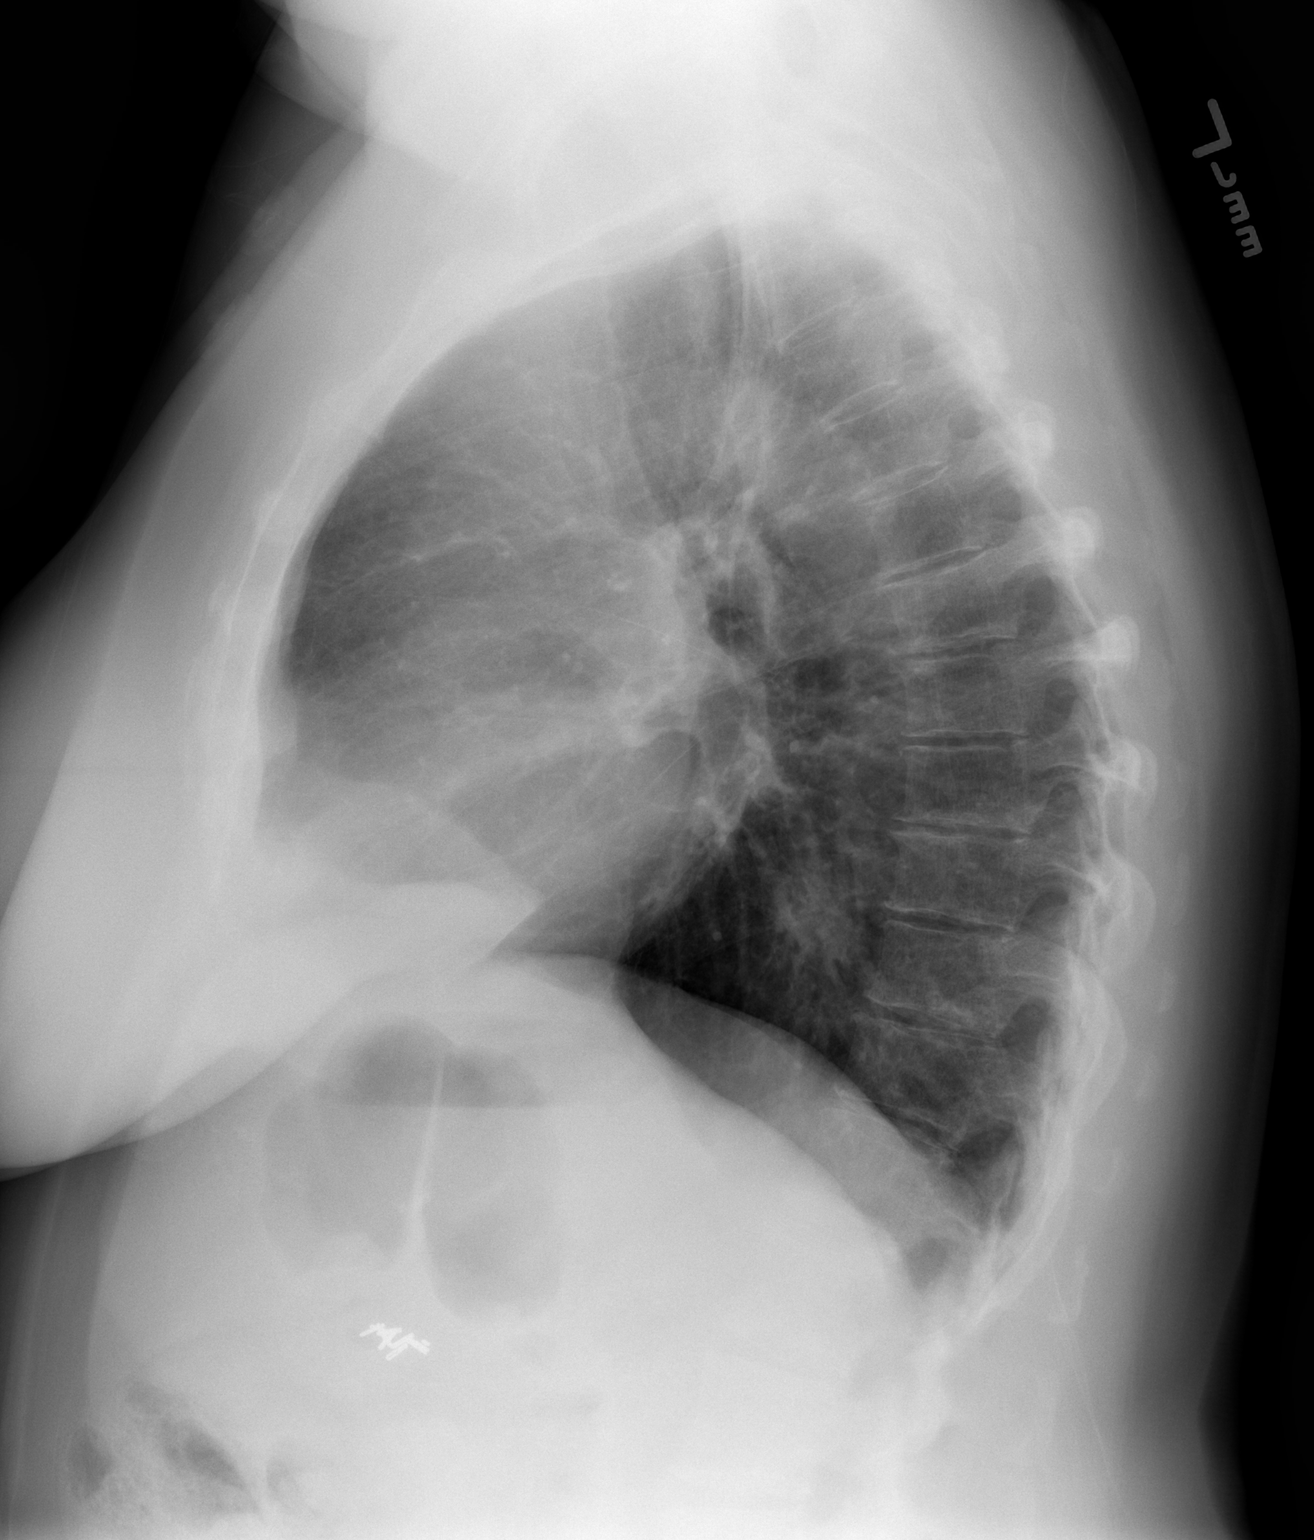

[2 of 2 positions shown; findings below may reference images not displayed]

FINDINGS: Cardiomediastinal silhouette is within normal limits. The
lungs are clear. No pleural effusion.  No pneumothorax.  No acute
osseous abnormality.  Cholecystectomy clips are noted.
IMPRESSION: Normal chest.

## 2014-11-09 ENCOUNTER — Other Ambulatory Visit: Payer: Self-pay | Admitting: Neurology

## 2014-11-09 MED ORDER — DIAZEPAM 5 MG PO TABS: 5.0000 mg | ORAL_TABLET | Freq: Every day | ORAL | Status: DC

## 2014-11-09 MED ORDER — ZOLPIDEM TARTRATE 10 MG PO TABS: 10.0000 mg | ORAL_TABLET | Freq: Every day | ORAL | Status: DC

## 2014-11-09 NOTE — Telephone Encounter (Signed)
Patient requesting refill for Rx's diazepam (VALIUM) 5 MG tablet and zolpidem (AMBIEN) 10 MG tablet.  Please forward to [facility] mail order Pharmacy.  Please call and advise.

## 2014-12-20 ENCOUNTER — Telehealth: Payer: Self-pay | Admitting: Neurology

## 2014-12-20 MED ORDER — DIAZEPAM 5 MG PO TABS: 5.0000 mg | ORAL_TABLET | Freq: Every day | ORAL | Status: DC

## 2014-12-20 NOTE — Telephone Encounter (Signed)
Spoke with Moon--11-09-14 Diazepam rx. was not signed.  New rx. faxed to the [place] at fax # [phone]

## 2014-12-20 NOTE — Telephone Encounter (Signed)
Patient called stating that she picked up her script for diazepam (VALIUM) 5 MG tablet on 11/09/14 and states that the script was missing signature from Dr. Felecia Shelling. Please call and advice # [phone]

## 2014-12-27 DIAGNOSIS — E785 Hyperlipidemia, unspecified: Secondary | ICD-10-CM | POA: Diagnosis not present

## 2014-12-27 DIAGNOSIS — E049 Nontoxic goiter, unspecified: Secondary | ICD-10-CM | POA: Diagnosis not present

## 2014-12-27 DIAGNOSIS — N390 Urinary tract infection, site not specified: Secondary | ICD-10-CM | POA: Diagnosis not present

## 2014-12-27 DIAGNOSIS — R8290 Unspecified abnormal findings in urine: Secondary | ICD-10-CM | POA: Diagnosis not present

## 2015-01-03 DIAGNOSIS — Z0000 Encounter for general adult medical examination without abnormal findings: Secondary | ICD-10-CM | POA: Diagnosis not present

## 2015-01-03 DIAGNOSIS — K219 Gastro-esophageal reflux disease without esophagitis: Secondary | ICD-10-CM | POA: Diagnosis not present

## 2015-01-03 DIAGNOSIS — Z1389 Encounter for screening for other disorder: Secondary | ICD-10-CM | POA: Diagnosis not present

## 2015-01-03 DIAGNOSIS — G35 Multiple sclerosis: Secondary | ICD-10-CM | POA: Diagnosis not present

## 2015-01-03 DIAGNOSIS — E785 Hyperlipidemia, unspecified: Secondary | ICD-10-CM | POA: Diagnosis not present

## 2015-01-03 DIAGNOSIS — Z6827 Body mass index (BMI) 27.0-27.9, adult: Secondary | ICD-10-CM | POA: Diagnosis not present

## 2015-01-03 DIAGNOSIS — F419 Anxiety disorder, unspecified: Secondary | ICD-10-CM | POA: Diagnosis not present

## 2015-01-18 ENCOUNTER — Encounter: Payer: Self-pay | Admitting: Women's Health

## 2015-01-18 ENCOUNTER — Ambulatory Visit (INDEPENDENT_AMBULATORY_CARE_PROVIDER_SITE_OTHER): Payer: Medicare Other | Admitting: Women's Health

## 2015-01-18 VITALS — BP 128/80 | Wt 152.0 lb

## 2015-01-18 DIAGNOSIS — R8299 Other abnormal findings in urine: Secondary | ICD-10-CM | POA: Diagnosis not present

## 2015-01-18 DIAGNOSIS — N879 Dysplasia of cervix uteri, unspecified: Secondary | ICD-10-CM | POA: Diagnosis not present

## 2015-01-18 DIAGNOSIS — Z01419 Encounter for gynecological examination (general) (routine) without abnormal findings: Secondary | ICD-10-CM

## 2015-01-18 NOTE — Progress Notes (Signed)
Leslie Ryan [facility] 1951-04-23 [id number]    History:    Presents for annual exam.  TAH with BSO for endometriosis on no HRT. 1977 cervical dysplasia with normal Paps after. Normal vitamin D level. 2014 DEXA T score -0.7 at right hip. 2013 benign colon polyps, also has celiac disease. Has multiple sclerosis. Normal mammogram history. Lost 40 pounds this past year with [employer].  Past medical history, past surgical history, family history and social history were all reviewed and documented in the EPIC chart. Has full custody of 9 year old granddaughter in the process of adopting. Adopted daughter drug abuse history. 65 year old mother lives with her breast cancer with metastasis currently having chemotherapy and radiation. Husband memory issues/Alzheimer's.  ROS:  A ROS was performed and pertinent positives and negatives are included.  Exam:  Filed Vitals:   01/18/15 1355  BP: 128/80    General appearance:  Normal Thyroid:  Symmetrical, normal in size, without palpable masses or nodularity. Respiratory  Auscultation:  Clear without wheezing or rhonchi Cardiovascular  Auscultation:  Regular rate, without rubs, murmurs or gallops  Edema/varicosities:  Not grossly evident Abdominal  Soft,nontender, without masses, guarding or rebound.  Liver/spleen:  No organomegaly noted  Hernia:  None appreciated  Skin  Inspection:  Grossly normal   Breasts: Examined lying and sitting.     Right: Without masses, retractions, discharge or axillary adenopathy.     Left: Without masses, retractions, discharge or axillary adenopathy. Gentitourinary   Inguinal/mons:  Normal without inguinal adenopathy  External genitalia:  Normal  BUS/Urethra/Skene's glands:  Normal  Vagina:  Normal  Cervix:  And uterus absent  Adnexa/parametria:     Rt: Without masses or tenderness.   Lt: Without masses or tenderness.  Anus and perineum: Normal  Digital rectal exam: Normal sphincter tone without palpated  masses or tenderness  Assessment/Plan:  64 y.o. MWF G0 for annual exam.    TAH/BSO for endometriosis no HRT/not sexually active MS Situational stress-raising 65 year old granddaughter/65 year old mother lives with her with metastatic cancer undergoing treatment/husband        Alzheimer's Hypothyroid-meds and Labs-primary care manages  Plan: SBE's ease, continue annual screening mammogram, 3-D encouraged. Regular exercise, calcium rich diet, vitamin D 1000 daily encouraged. Will repeat DEXA next year. Congratulated on 40 pound weight loss, continue healthy lifestyle. Discussed counseling, denies need. Reviewed importance of increasing leisure and self-care. Home safety, fall prevention and importance of regular exercise reviewed. [facility], 2:32 PM 01/18/2015

## 2015-01-18 NOTE — Patient Instructions (Signed)
Recomendaciones de salud para mujeres posmenopusicas ( Health Recommendations for Postmenopausal Women) Investigaciones respetadas y en curso han estudiado las causas ms frecuentes de [place], incapacidad y [place] calidad de vida de las mujeres posmenopusicas. Las causas incluyen enfermedad cardaca, enfermedades de los vasos sanguneos, diabetes, depresin, cncer y prdida sea (osteoporosis). Pueden hacerse muchas cosas para reducir las posibilidades de Agricultural engineer y otros problemas frecuentes. ENFERMEDAD CARDIOVASCULAR Enfermedad cardaca: Un ataque cardaco es una emergencia mdica. Conozca los signos y sntomas de un ataque cardaco. Estas son algunas cosas que las mujeres pueden hacer para reducir el riesgo de tener una enfermedad cardaca.   No fume. Si fuma, abandone el hbito.  Intente tener un peso saludable. El sobrepeso provoca muchas muertes que podran prevenirse. Tenga una dieta saludable y equilibrada y beba suficiente cantidad de lquidos.  Mantngase en movimiento. Compromtase a hacer ms actividad fsica. Intente hacer 65mnutos de aGenuine Parts o la mState Farmde los das  Los alimentos deben cuidar la salud de su corazn. Opte por una dieta baja en grasas saturadas y colesterol y elimine las grasas trans. Incluya cereales integrales, frutas y vegetales. Lea y comprenda las etiquetas de los alimentos antes de comprarlos.  Conozca sus cifras. Solicite a su mdico que controle su presin arterial, colesterol (total, HDL, LDL, triglicridos) y gPrintmaker Colabore con su mdico para mejorar su condicin clnica general.  Hipertensin arterial. Limite o suspenda el consumo de sal de mesa (pruebe con sustitutos de la sal y condimentos). Evite los alimentos y las bebidas con sal. Lea las etiquetas de los alimentos antes de comprarlos. Comer bien y hacer ejercicio ayuda a controlar la presin arterial alta. ICTUS  Un ictus es uEngineering geologist El ictus  puede ser resultado de un cogulo de sangre en un vaso sanguneo en el cerebro o una hemorragia (sangrado) cerebral. Conozca los signos y sntomas de un ictus. Para reducir el riesgo de tener un ictus:  Evite los alimentos grasosos.  Deje de fumar.  Contrlese la diabetes, la presin arterial y si tiene frecuencia cardaca irregular. TROMBOFLEBITIS (COGULO DE SANGRE) EN LA PIERNA  Tener sobrepeso y un estilo de vida sedentario tambin contribuyen a dActorcogulos de sBiochemist, clinical Controlar su dieta y hacer ejercicios la ayudarn a reducir el riesgo de desarrollar cogulos de sParma DETECCIN DE CNCER  Cncer de mama: Tome medidas para reducir el riesgo de tBest boycncer de mama.  Debe practicar la "autoconciencia de las mamas". Esto significa que dChief Technology Officerapariencia normal de sus mamas y cBancrofty hElectrical engineerun autoexamen de mGlass blower/designer Si detecta algn cambio, no importa cun pequeo sea, debe informarlo a su mdico.  Despus de los 40 aos, debe hacerse un examen clnico de las mMicrosoft  A partir de los 40aos, considere hacerse una mamografa (radiografa de mamas) cada ao.  Si tiene hEditor, commissioningde cncer de mama, hable con su mdico para hacer un estudio gentico.  Si tiene ms riesgo de cncer de mama, hable con su mdico para hacerse resonancia magntica y u3M Company  Cncer de estmago o intestinal: Considere hacerse las siguientes pruebas: examen rectal, sangre oculta en heces, sigmoidoscopa y colonoscopa. Las mujeres con alto riesgo tal vez necesiten hacerse los exmenes de deteccin desde una edad ms temprana y con ms frecuencia.  Cncer de cuello uterino:  Luego de los 30aos, debe realizarse una prueba de Papanicolaou cada tres aos siempre que los tres estudios anteriores sean normales.  Si ha recibido un tratamiento para el cncer de cuello uterino o para una enfermedad que podra causar cncer, necesitar realizar una  prueba de Papanicolaou y controles durante, al [place], 20aos de concluir el [place].  Si le han realizado una histerectoma por un problema que no era cncer u otra enfermedad que podra causar cncer, ya no necesitar la prueba de Papanicolaou.  Si tiene entre 65y 70aos y ha tenido un Papanicolaou normal en los ltimos 10aos, no ser ms necesario realizarlo.  Si no se ha hecho la prueba con regularidad, Chief Executive Officer a [employer] factores de riesgo (como el tener un nuevo compaero sexual) para Programmer, applications a Actuary.  Algunos problemas mdicos aumentan la probabilidad de Optician, dispensing de cuello uterino. En estos casos, el mdico podr [employer] se realice la prueba de Papanicolaou con ms frecuencia.  Cncer de tero: Informe a su mdico si tiene hemorragia vaginal despus de la menopausia.  Cncer de ovarios: No existen pruebas confiables disponibles para detectar el cncer de ovarios en esta etapa excepto los exmenes plvicos anuales.  Cncer de pulmn: Las radiografas de trax Firefighter de pulmn y deben hacerse en mujeres con alto riesgo, [place] y las que tienen enfermedad pulmonar crnica (enfisema).  Cncer de piel: Debe realizarse un examen completo de la piel con su examen anual. Evite exponerse en exceso al sol y a las lmparas de [place]. Use una crema de alta proteccin cuando est al sol. Todo esto es importante para reducir el riesgo de cncer de piel. MENOPAUSIA Sntomas de la menopausia: La terapia hormonal es efectiva para tratar los sntomas asociados con la menopausia:  Calores repentinos moderados a graves.  Sudoracin nocturna.  Cambios en el estado de nimo.  Dolores de [place].  Cansancio.  Prdida del impulso sexual.  Insomnio.  Otros sntomas. La sustitucin hormonal conlleva ciertos riesgos, especialmente en las mujeres de mayor edad. Las [employer] Home	[place], o  consideran usar, Oceanographer o progestina y estrgeno deben consultarlo con su mdico. Su mdico la ayudar a comprender los beneficios y los riesgos. Se desconoce cul es la dosis ideal de terapia de sustitucin hormonal. La Administracin de Drogas y Alimentos de los Estados [place] (FDA) concluy que la terapia hormonal debe usarse solamente durante el menor tiempo y en las dosis ms bajas posibles para Science writer las metas de [place].  OSTEOPOROSIS Cmo protegerse contra la prdida sea y [place] hormonal para la prevencin de la prdida sea (osteoporosis), los riesgos de la prdida sea deben [place] [employer] riesgos de la terapia. Consulte a su mdico qu otros medicamentos son seguros y Hotel manager para prevenir la prdida sea y las fracturas. Para protegerse contra la prdida sea y las fracturas, se recomienda lo siguiente:  Si tiene menos de 50aos, tome 1070m de calcio y, al menos, 6051mde vitaminaD al daTraining and development officer Si tiene ms de 50Enterprise Productse 70aos, tome 120073me calcio y, al menos, 600m49m vitaminaD al da. SunTrusti tiene ms de 70aos, tome 1200mg76mcalcio y, al menos, 800mg 60mitaminaD al da. ElSunTrustabaquismo y el consumo excesivo de alcohol aumentan el riesgo de osteoporosis. Consuma alimentos ricos en calcio y vitaminaD y haga ejercicios con soporte de peso varias veces a la semana, segn lo recomendado por su mdico. DIABETES Diabetes mellitus: Si tiene diabetes tipo1 o tipo2, debe controAdvice workercar en sangre con dieta, ejercicios y los  medicamentos recomendados. Evite los [employer], almidonados y [place]. El sobrepeso puede dificultar el control. COGNICIN Y MEMORIA Cognicin y memoria: No se recomienda la terapia hormonal durante la menopausia para prevenir trastornos cognitivos como la enfermedad de Alzheimer o la prdida de [place].  DEPRESIN  La depresin puede ocurrir a Hotel manager, pero es frecuente en  las mujeres de [place]. Puede ser por necesidades y motivos fsicos, mdicos, sociales (soledad) o financieros. Si tiene depresin debido a problemas mdicos y al control de los sntomas, hable con su mdico. La actividad fsica y los ejercicios la ayudarn con el estado de nimo y el sueo. Participar de [employer] comunitarias y ofrecerse como voluntaria podra ayudarla a mejorar su sentido de mrito y Geographical information systems officer. Si tiene depresin y siente que el problema empeora o se agrava, hable con su mdico sobre cules son las mejores opciones de tratamiento para usted. ACCIDENTES  Los accidentes son frecuentes y pueden ser serios en las mujeres de [place]. Prepare su casa para prevenir los accidentes. Elimine los tapetes, coloque barandas en los baos y la ducha. Evite usar [employer] o caminar sobre reas hmedas o cubiertas con nieve o hielo. Si tiene problemas de visin o audicin o si siente que sus movimientos y reflejos son inestables, limite el tiempo de [place] o deje de [place]. HEPATITISC La hepatitisC es un tipo de infeccin viral que afecta el hgado. Se contagia principalmente a travs del contacto con la sangre de una persona infectada. Puede tratarse; pero si no se trata, con los [employer] producir un dao grave en el hgado. Muchas personas infectadas desconocen que tienen el virus en la [place]. Si naci durante el "baby boom", se recomienda que se haga un anlisis de deteccin de hepatitisC.  VACUNAS  Es importante que considere recibir varias vacunas en la tercera edad, por ejemplo:   Vacuna de refuerzo contra la pertussis, ttanos y difteria.  Vacuna anual contra la gripe antes del inicio de la temporada de gripe.  Vacuna contra la neumona.  Vacuna contra el herpes.  Otras vacunas, segn sus necesidades especficas. Consulte a su mdico acerca de estas vacunas. Document Released: 05/27/2013 Document Revised: 12/21/2013 [facility] Patient Information 2015 [place].  This information is not intended to replace advice given to you by your health care provider. Make sure you discuss any questions you have with your health care provider. Exercise to Stay Healthy Exercise helps you become and stay healthy. EXERCISE IDEAS AND TIPS Choose exercises that:  You enjoy.  Fit into your day. You do not need to exercise really hard to be healthy. You can do exercises at a slow or medium level and stay healthy. You can:  Stretch before and after working out.  Try yoga, Pilates, or tai chi.  Lift weights.  Walk fast, swim, jog, run, climb stairs, bicycle, dance, or rollerskate.  Take aerobic classes. Exercises that burn about 150 calories:  Running 1  miles in 15 minutes.  Playing volleyball for 45 to 60 minutes.  Washing and waxing a car for 45 to 60 minutes.  Playing touch football for 45 minutes.  Walking 1  miles in 35 minutes.  Pushing a stroller 1  miles in 30 minutes.  Playing basketball for 30 minutes.  Raking leaves for 30 minutes.  Bicycling 5 miles in 30 minutes.  Walking 2 miles in 30 minutes.  Dancing for 30 minutes.  Shoveling snow for 15 minutes.  Swimming laps for 20 minutes.  Walking up stairs  for 15 minutes.  Bicycling 4 miles in 15 minutes.  Gardening for 30 to 45 minutes.  Jumping rope for 15 minutes.  Washing windows or floors for 45 to 60 minutes. Document Released: 09/08/2010 Document Revised: 10/29/2011 Document Reviewed: 09/08/2010 [facility] Patient Information 2015 [place], [place]. This information is not intended to replace advice given to you by your health care provider. Make sure you discuss any questions you have with your health care provider.

## 2015-01-19 LAB — URINALYSIS W MICROSCOPIC + REFLEX CULTURE
BILIRUBIN URINE: NEGATIVE
Casts: NONE SEEN
Crystals: NONE SEEN
GLUCOSE, UA: NEGATIVE mg/dL
Hgb urine dipstick: NEGATIVE
Ketones, ur: NEGATIVE mg/dL
Nitrite: NEGATIVE
PH: 6.5 (ref 5.0–8.0)
Protein, ur: NEGATIVE mg/dL
Specific Gravity, Urine: <1.005 — ABNORMAL LOW (ref 1.005–1.030)
Urobilinogen, UA: 0.2 mg/dL (ref 0.0–1.0)

## 2015-01-20 LAB — URINE CULTURE

## 2015-01-27 DIAGNOSIS — Z803 Family history of malignant neoplasm of breast: Secondary | ICD-10-CM | POA: Diagnosis not present

## 2015-01-27 DIAGNOSIS — Z1231 Encounter for screening mammogram for malignant neoplasm of breast: Secondary | ICD-10-CM | POA: Diagnosis not present

## 2015-01-31 ENCOUNTER — Encounter: Payer: Self-pay | Admitting: Gynecology

## 2015-02-14 ENCOUNTER — Other Ambulatory Visit: Payer: Self-pay

## 2015-03-16 ENCOUNTER — Encounter: Payer: Self-pay | Admitting: Neurology

## 2015-03-16 ENCOUNTER — Ambulatory Visit (INDEPENDENT_AMBULATORY_CARE_PROVIDER_SITE_OTHER): Payer: Medicare Other | Admitting: Neurology

## 2015-03-16 VITALS — BP 126/72 | HR 70 | Resp 16 | Ht 63.5 in | Wt 153.2 lb

## 2015-03-16 DIAGNOSIS — R5382 Chronic fatigue, unspecified: Secondary | ICD-10-CM

## 2015-03-16 DIAGNOSIS — R42 Dizziness and giddiness: Secondary | ICD-10-CM | POA: Diagnosis not present

## 2015-03-16 DIAGNOSIS — R260 Ataxic gait: Secondary | ICD-10-CM | POA: Diagnosis not present

## 2015-03-16 DIAGNOSIS — G35 Multiple sclerosis: Secondary | ICD-10-CM | POA: Diagnosis not present

## 2015-03-16 MED ORDER — ZOLPIDEM TARTRATE 10 MG PO TABS: 10.0000 mg | ORAL_TABLET | Freq: Every day | ORAL | Status: DC

## 2015-03-16 MED ORDER — TRIAMTERENE-HCTZ 37.5-25 MG PO CAPS: 1.0000 | ORAL_CAPSULE | Freq: Every day | ORAL | Status: DC

## 2015-03-16 MED ORDER — TERIFLUNOMIDE 14 MG PO TABS: 14.0000 mg | ORAL_TABLET | Freq: Every day | ORAL | Status: DC

## 2015-03-16 MED ORDER — DIAZEPAM 5 MG PO TABS: 5.0000 mg | ORAL_TABLET | Freq: Every day | ORAL | Status: DC

## 2015-03-16 NOTE — Progress Notes (Signed)
[facility]  PATIENT: Leslie Ryan DOB: 25-Aug-1950  REFERRING CLINICIAN: Burnard Bunting HISTORY FROM: patient REASON FOR VISIT: MS   HISTORICAL  CHIEF COMPLAINT:  Chief Complaint  Patient presents with  . Multiple Sclerosis    Sts. she continues to tolerate Aubagio well.  Sts. vertigo is ok.  Sts. she continues Dyazide as rx'd.  Denies new or worsening symptoms.[place]  . Dizziness    HISTORY OF PRESENT ILLNESS:  Leslie Ryan is a 64 year old woman with multiple sclerosis.   She has been on Aubagio since 2014 and is tolerating it well.   She denies any new exacerbation.  MS History:  In the mid 1980s, she presented with right optic neuritis while working at [facility]. A lumbar puncture was inconclusive but an MRI of the brain was consistent with MS a few years later. In the 1980s and early 1990s, she received multiple rounds of IV steroids for occasional exacerbations involving vision or gait. In 1994, she started Betaseron stopped quickly due to severe injection site reactions. In January 1997, after an exacerbation she was started on Avonex and continued on Avonex for the next 15 years or so. She had some issues with compliance on some occasions.   In the late 1990s, she began to have more permanent gait issues the right foot drop that slowly worsened. I saw early MRI reports from 2000 showing 2 foci at C6 and C7 on the cervical spine and predominantly periventricular foci on brain MRI.   On 02/20/2013, she had the sudden onset of vertigo that was not altered by movements. Her gait worsened with ataxia and she felt like she was being pushed to the right as she walked. She did not note any numbness or weakness or visual changes. An MRI of the brain on 02/25/2013 showed multiple periventricular white matter lesions consistent with MS. She received 3 days of IV steroids with benefit. I personally reviewed the MRI and felt that there were 3 new lesions in the  left frontal parietal out of that were not present on her prior MRI dated 08/08/2010 the foci enhanced. Due to the exacerbation and the MRI changes, we made a decision to switch her from Avonex to Aubagio. She has had no definite exacerbations well on Aubagio. She tolerates it well and liver function tests have been fine.  Gait/strength/sensatoin:  She stumbles some but no falls.   She feels gait is actually better this year since losing some weight.  She has leg cramps and spasticity leading to a mildly spastic gait.     She notes mild weakness in the legs. Diazepam has helped a little bit with this spasticity. She used to be on Flexeril but that had not helped.  The spasticity in her legs are associated with decrease gait. If she exercises her legs get very weak and she has more trouble walking.  Bladder/Bowel:  She denies any significant difficulty with her bladder.   Vision:  Vision is worse but she has been diagnosed with macular degeneration  Fatigue/sleep:   She also has fatigue, better since losing weight.  She feels her fatigue is more physical than mental.  She has insomnia that is helped by Ambien.   Insomnia was more of a problem with sleep onset.   Once asleep, she usually gets at least 5 hours slepe    Mood/Cognition:    She has had some depression that has responded well to fluoxetine.  She also occasionally takes diazepam for anxiety.  She denies any significant cognitive issues but sometimes feels that she is not as good with some cognitive tasks as she used to be.   She has Mnire's disease . Her most recent episode of vertigo occurred the first week of November 2015.   I added triamterene-HCTZ and she has not had any further episodes of vertigo.    REVIEW OF SYSTEMS:  Constitutional: No fevers, chills, sweats, or change in appetite Eyes: No visual changes, double vision, eye pain Ear, nose and throat: No hearing loss, ear pain, nasal congestion, sore throat Cardiovascular: No  chest pain, palpitations Respiratory:  No shortness of breath at rest or with exertion.   No wheezes GastrointestinaI: No nausea, vomiting, diarrhea, abdominal pain, fecal incontinence Genitourinary:  No dysuria, urinary retention or frequency.  No nocturia. Musculoskeletal:  No neck pain, back pain Integumentary: No rash, pruritus, skin lesions Neurological: as above Psychiatric: No depression at this time.  No anxiety Endocrine: No palpitations, diaphoresis, change in appetite, change in weigh or increased thirst.   She has felt cold since thyroid med's changed Hematologic/Lymphatic:  No anemia, purpura, petechiae. Allergic/Immunologic: No itchy/runny eyes, nasal congestion, recent allergic reactions, rashes  ALLERGIES: Allergies  Allergen Reactions  . Erythromycin Other (See Comments)    Abdominal cramping  . Morphine And Related     Patient described heaviness and loss of feeling in legs coupled with a squeezing feeling in the epigastric region. Requested no further Morphine doses.       HOME MEDICATIONS: Outpatient Prescriptions Prior to Visit  Medication Sig Dispense Refill  . aspirin 81 MG tablet Take 81 mg by mouth daily.     . Cholecalciferol (VITAMIN D-3) 5000 UNITS TABS Take 1 tablet by mouth every other day.     . diazepam (VALIUM) 5 MG tablet Take 1 tablet (5 mg total) by mouth daily. 90 tablet 1  . FLUoxetine (PROZAC) 20 MG capsule Take 20 mg by mouth daily.      . lansoprazole (PREVACID) 30 MG capsule Take 30 mg by mouth daily.     Marland Kitchen levothyroxine (SYNTHROID, LEVOTHROID) 112 MCG tablet Take 112 mcg by mouth daily.      . pravastatin (PRAVACHOL) 40 MG tablet Take 20 mg by mouth daily.     . Teriflunomide (AUBAGIO) 14 MG TABS Take 14 mg by mouth daily. 90 tablet 4  . triamterene-hydrochlorothiazide (DYAZIDE) 37.5-25 MG per capsule Take 1 each (1 capsule total) by mouth daily. 90 capsule 3  . vitamin E 400 UNIT capsule Take 400 Units by mouth daily.      Marland Kitchen zolpidem  (AMBIEN) 10 MG tablet Take 1 tablet (10 mg total) by mouth at bedtime. 90 tablet 1  . Cyclobenzaprine HCl (FLEXERIL PO) Take 1 tablet by mouth 3 (three) times daily as needed (muscle spasms).     No facility-administered medications prior to visit.    PAST MEDICAL HISTORY: Past Medical History  Diagnosis Date  . Multiple sclerosis   . Endometriosis   . Hypothyroidism   . Cervical dysplasia   . Celiac disease   . High cholesterol   . Hearing loss   . Vision abnormalities     PAST SURGICAL HISTORY: Past Surgical History  Procedure Laterality Date  . Gynecologic cryosurgery  1987  . Cholecystectomy    . Rotator cuff repair    . Finger surgery    . Abdominal hysterectomy  1999    TAH,BSO menorrhgia,,endometriosis  . Colposcopy    . Oophorectomy    .  Abdominal hysterectomy    . Cholecystectomy    . Rotator cuff repair Right     FAMILY HISTORY: Family History  Problem Relation Age of Onset  . Breast cancer Mother     age 39's  . Cancer Mother     UTERINE  . Diabetes Father   . Hypertension Father   . Heart disease Father   . Cancer Maternal Grandfather     Stomach cancer  . Heart disease Paternal Grandfather     SOCIAL HISTORY:  History   Social History  . Marital Status: Married    Spouse Name: N/A  . Number of Children: N/A  . Years of Education: N/A   Occupational History  . Not on file.   Social History Main Topics  . Smoking status: Former Research scientist (life sciences)  . Smokeless tobacco: Not on file  . Alcohol Use: No  . Drug Use: No  . Sexual Activity: No   Other Topics Concern  . Not on file   Social History Narrative     PHYSICAL EXAM  Filed Vitals:   03/16/15 1300  BP: 126/72  Pulse: 70  Resp: 16  Height: 5' 3.5" (1.613 m)  Weight: 153 lb 3.2 oz (69.491 kg)    Body mass index is 26.71 kg/(m^2).   General: The patient is well-developed and well-nourished and in no acute distress  Neck:.  The neck is nontender with good ROM  Skin:  Extremities are without significant edema.  Neurologic Exam  Mental status: The patient is alert and oriented x 3 at the time of the examination. The patient has apparent normal recent and remote memory, with an apparently normal attention span and concentration ability.   Speech is normal.  Cranial nerves: Extraocular movements are full.   Facial symmetry is present. There is good facial sensation to soft touch bilaterally.Facial strength is normal.  Trapezius and sternocleidomastoid strength is normal. No dysarthria is noted.  The tongue is midline, and the patient has symmetric elevation of the soft palate. No obvious hearing deficits are noted.  Motor:  Muscle bulk is normal but tone is increased. Strength is  5 / 5 in all 4 extremities.   Sensory: Sensory testing is intact to touch, vibration sensation on all 4 extremities.  Coordination: Cerebellar testing reveals good finger-nose-finger and heel-to-shin bilaterally.  Gait and station: Station and gait are normal. Tandem gait is wide. Romberg is negative.   Reflexes: Deep tendon reflexes are symmetric and normal bilaterally.      DIAGNOSTIC DATA (LABS, IMAGING, TESTING) - I reviewed patient records, labs, notes, testing and imaging myself where available.  Lab Results  Component Value Date   WBC 3.0* 06/21/2014   HGB 13.1 06/21/2014   HCT 39.4 06/21/2014   MCV 85.1 06/21/2014   PLT 224 06/21/2014      Component Value Date/Time   NA 140 06/21/2014 0452   K 3.9 06/21/2014 0452   CL 105 06/21/2014 0452   CO2 22 06/21/2014 0452   GLUCOSE 124* 06/21/2014 0452   BUN 13 06/21/2014 0452   CREATININE 0.49* 06/21/2014 0452   CALCIUM 8.5 06/21/2014 0452   PROT 6.5 06/21/2014 0452   ALBUMIN 3.5 06/21/2014 0452   AST 21 06/21/2014 0452   ALT 36* 06/21/2014 0452   ALKPHOS 56 06/21/2014 0452   BILITOT 0.3 06/21/2014 0452   GFRNONAA >90 06/21/2014 0452   GFRAA >90 06/21/2014 0452   No results found for: CHOL, HDL, LDLCALC,  LDLDIRECT, TRIG, CHOLHDL No results found  for: HGBA1C No results found for: VITAMINB12 Lab Results  Component Value Date   TSH 0.042* 06/21/2014        ASSESSMENT AND PLAN  Multiple sclerosis  Ataxic gait  Vertigo  Chronic fatigue    1.  Continue Aubagio.    She gets blood work regularly with her endocrinologist 2.  Continue Dyazide for her Mnire's disease and Valium for her spasticity and Ambien for him. 3.  She will continue to stay active, exercise as tolerated and try to lose a little bit more weight.  She will return to see me in about 6  months, sooner if she has new or worsening neurologic symptoms.    Elane Peabody A. Felecia Shelling, MD, PhD 6/54/2715, 6:64 PM Certified in Neurology, Clinical Neurophysiology, Sleep Medicine, Pain Medicine and Neuroimaging  [facility] [address] [place], [place] [address] [phone]

## 2015-06-27 DIAGNOSIS — Z23 Encounter for immunization: Secondary | ICD-10-CM | POA: Diagnosis not present

## 2015-09-05 ENCOUNTER — Telehealth: Payer: Self-pay | Admitting: Neurology

## 2015-09-05 MED ORDER — TERIFLUNOMIDE 14 MG PO TABS
14.0000 mg | ORAL_TABLET | Freq: Every day | ORAL | Status: DC
Start: 2015-09-05 — End: 2015-09-15

## 2015-09-05 NOTE — Telephone Encounter (Signed)
Rx has been sent  

## 2015-09-05 NOTE — Telephone Encounter (Signed)
Pt called said she needs refill for Teriflunomide (AUBAGIO) 14 MG TABS to [facility]. She has 10 days left.

## 2015-09-15 ENCOUNTER — Ambulatory Visit (INDEPENDENT_AMBULATORY_CARE_PROVIDER_SITE_OTHER): Payer: Medicare Other | Admitting: Neurology

## 2015-09-15 ENCOUNTER — Encounter: Payer: Self-pay | Admitting: Neurology

## 2015-09-15 VITALS — BP 118/78 | HR 78 | Resp 16 | Ht 63.5 in | Wt 156.0 lb

## 2015-09-15 DIAGNOSIS — G35 Multiple sclerosis: Secondary | ICD-10-CM

## 2015-09-15 DIAGNOSIS — H8109 Meniere's disease, unspecified ear: Secondary | ICD-10-CM | POA: Diagnosis not present

## 2015-09-15 DIAGNOSIS — G4700 Insomnia, unspecified: Secondary | ICD-10-CM | POA: Insufficient documentation

## 2015-09-15 DIAGNOSIS — R260 Ataxic gait: Secondary | ICD-10-CM | POA: Diagnosis not present

## 2015-09-15 DIAGNOSIS — R42 Dizziness and giddiness: Secondary | ICD-10-CM

## 2015-09-15 DIAGNOSIS — R5382 Chronic fatigue, unspecified: Secondary | ICD-10-CM

## 2015-09-15 MED ORDER — ZOLPIDEM TARTRATE 10 MG PO TABS: 10.0000 mg | ORAL_TABLET | Freq: Every day | ORAL | Status: DC

## 2015-09-15 MED ORDER — TERIFLUNOMIDE 14 MG PO TABS: 14.0000 mg | ORAL_TABLET | Freq: Every day | ORAL | Status: DC

## 2015-09-15 MED ORDER — TRIAMTERENE-HCTZ 37.5-25 MG PO CAPS: 1.0000 | ORAL_CAPSULE | Freq: Every day | ORAL | Status: DC

## 2015-09-15 MED ORDER — DIAZEPAM 5 MG PO TABS: 5.0000 mg | ORAL_TABLET | Freq: Every day | ORAL | Status: DC

## 2015-09-15 MED ORDER — DIAZEPAM 5 MG PO TABS: ORAL_TABLET | ORAL | Status: DC

## 2015-09-15 NOTE — Progress Notes (Signed)
[facility]  PATIENT: Leslie Ryan DOB: [birth year]  REFERRING CLINICIAN: Burnard Bunting HISTORY FROM: patient REASON FOR VISIT: MS   HISTORICAL  CHIEF COMPLAINT:  Chief Complaint  Patient presents with  . Multiple Sclerosis    Sts. she continues to tolerate Aubagio well.   Denies new or worsening MS sx.  Sts. she has had 2 episodes of severe vertigo despite compliance with Dyazide./fim  . Meniere's Disease    HISTORY OF PRESENT ILLNESS:  Leslie Ryan is a 65 year old woman with multiple sclerosis.   She has been on Aubagio since 2014 and is tolerating it well.   She denies any new exacerbation.  She has a lot of stress with mothers health (breast and colon cancer), husband's Alzheimer's disease and child with issues moving back in.     Gait/strength/sensatoin:  She stumbles some but no falls.    She has a spastic gait.    She gets osccasional severe leg cramps.       She notes mild weakness in the legs. Diazepam has helped a little bit with this spasticity.  She is not exercising because she feels so weak afterwards  Bladder/Bowel:  She denies any significant difficulty with her bladder.   Vision:  Vision is worse.   She has been diagnosed with macular degeneration  Fatigue/sleep:   She also has fatigue, better since losing weight.  She feels her fatigue is more physical than mental.  She has insomnia that is helped by Ambien.   Insomnia was more of a problem with sleep onset.   Once asleep, she usually gets at least 5 hours slepe    Mood/Cognition:    She has had depression, worth with more stress.   She is on fluoxetine.  She also occasionally takes diazepam for anxiety.   She denies any significant cognitive issues but notes mild memory problems at times.    She has Mnire's disease with severe vertigo and N/V.   Marland Kitchen She had 2 episodes in September 2016, each lasting 1-2 days.  She is on Dyazide and takes a valium if an episode occurs.   We dicussed  taking more valium on those rare days.      MS History:  In the mid 1980s, she presented with right optic neuritis while working at [facility]. A lumbar puncture was inconclusive but an MRI of the brain was consistent with MS a few years later. In the 1980s and early 1990s, she received multiple rounds of IV steroids for occasional exacerbations involving vision or gait. In 1994, she started Betaseron stopped quickly due to severe injection site reactions. In January 1997, after an exacerbation she was started on Avonex and continued on Avonex for the next 15 years or so. She had some issues with compliance on some occasions.   In the late 1990s, she began to have more permanent gait issues the right foot drop that slowly worsened. I saw early MRI reports from 2000 showing 2 foci at C6 and C7 on the cervical spine and predominantly periventricular foci on brain MRI.   On 02/20/2013, she had the sudden onset of vertigo that was not altered by movements. Her gait worsened with ataxia and she felt like she was being pushed to the right as she walked. She did not note any numbness or weakness or visual changes. An MRI of the brain on 02/25/2013 showed multiple periventricular white matter lesions consistent with MS. She received 3 days of IV steroids with benefit.  I personally reviewed the MRI and felt that there were 3 new lesions in the left frontal parietal out of that were not present on her prior MRI dated 08/08/2010 the foci enhanced. Due to the exacerbation and the MRI changes, she switched to Aubagio in 2014. She tolerates it well and liver function tests have been fine.   REVIEW OF SYSTEMS:  Constitutional: No fevers, chills, sweats, or change in appetite Eyes: No visual changes, double vision, eye pain Ear, nose and throat: No hearing loss, ear pain, nasal congestion, sore throat Cardiovascular: No chest pain, palpitations Respiratory:  No shortness of breath at rest or with exertion.   No  wheezes GastrointestinaI: No nausea, vomiting, diarrhea, abdominal pain, fecal incontinence Genitourinary:  No dysuria, urinary retention or frequency.  No nocturia. Musculoskeletal:  No neck pain, back pain Integumentary: No rash, pruritus, skin lesions Neurological: as above Psychiatric: No depression at this time.  No anxiety Endocrine: No palpitations, diaphoresis, change in appetite, change in weigh or increased thirst.   She has felt cold since thyroid med's changed Hematologic/Lymphatic:  No anemia, purpura, petechiae. Allergic/Immunologic: No itchy/runny eyes, nasal congestion, recent allergic reactions, rashes  ALLERGIES: Allergies  Allergen Reactions  . Erythromycin Other (See Comments)    Abdominal cramping  . Morphine And Related     Patient described heaviness and loss of feeling in legs coupled with a squeezing feeling in the epigastric region. Requested no further Morphine doses.       HOME MEDICATIONS: Outpatient Prescriptions Prior to Visit  Medication Sig Dispense Refill  . aspirin 81 MG tablet Take 81 mg by mouth daily.     . Cholecalciferol (VITAMIN D-3) 5000 UNITS TABS Take 1 tablet by mouth every other day.     . Cyclobenzaprine HCl (FLEXERIL PO) Take 1 tablet by mouth 3 (three) times daily as needed (muscle spasms).    . diazepam (VALIUM) 5 MG tablet Take 1 tablet (5 mg total) by mouth daily. 90 tablet 1  . FLUoxetine (PROZAC) 20 MG capsule Take 20 mg by mouth daily.      . lansoprazole (PREVACID) 30 MG capsule Take 30 mg by mouth daily.     Marland Kitchen levothyroxine (SYNTHROID, LEVOTHROID) 112 MCG tablet Take 112 mcg by mouth daily.      . pravastatin (PRAVACHOL) 40 MG tablet Take 20 mg by mouth daily.     . Teriflunomide (AUBAGIO) 14 MG TABS Take 14 mg by mouth daily. 90 tablet 1  . triamterene-hydrochlorothiazide (DYAZIDE) 37.5-25 MG per capsule Take 1 each (1 capsule total) by mouth daily. 90 capsule 3  . vitamin E 400 UNIT capsule Take 400 Units by mouth daily.        Marland Kitchen zolpidem (AMBIEN) 10 MG tablet Take 1 tablet (10 mg total) by mouth at bedtime. 90 tablet 1   No facility-administered medications prior to visit.    PAST MEDICAL HISTORY: Past Medical History  Diagnosis Date  . Multiple sclerosis ([place])   . Endometriosis   . Hypothyroidism   . Cervical dysplasia   . Celiac disease   . High cholesterol   . Hearing loss   . Vision abnormalities     PAST SURGICAL HISTORY: Past Surgical History  Procedure Laterality Date  . Gynecologic cryosurgery  1987  . Cholecystectomy    . Rotator cuff repair    . Finger surgery    . Abdominal hysterectomy  1999    TAH,BSO menorrhgia,,endometriosis  . Colposcopy    . Oophorectomy    .  Abdominal hysterectomy    . Cholecystectomy    . Rotator cuff repair Right     FAMILY HISTORY: Family History  Problem Relation Age of Onset  . Breast cancer Mother     age 53's  . Cancer Mother     UTERINE  . Diabetes Father   . Hypertension Father   . Heart disease Father   . Cancer Maternal Grandfather     Stomach cancer  . Heart disease Paternal Grandfather     SOCIAL HISTORY:  Social History   Social History  . Marital Status: Married    Spouse Name: N/A  . Number of Children: N/A  . Years of Education: N/A   Occupational History  . Not on file.   Social History Main Topics  . Smoking status: Former Research scientist (life sciences)  . Smokeless tobacco: Not on file  . Alcohol Use: No  . Drug Use: No  . Sexual Activity: No   Other Topics Concern  . Not on file   Social History Narrative     PHYSICAL EXAM  Filed Vitals:   09/15/15 1127  BP: 118/78  Pulse: 78  Resp: 16  Height: 5' 3.5" (1.613 m)  Weight: 156 lb (70.761 kg)    Body mass index is 27.2 kg/(m^2).   General: The patient is well-developed and well-nourished and in no acute distress  Neck:.  The neck is nontender with good ROM  Skin: Extremities are without significant edema.  Neurologic Exam  Mental status: The patient is alert and  oriented x 3 at the time of the examination. The patient has apparent normal recent and remote memory, with an apparently normal attention span and concentration ability.   Speech is normal.  Cranial nerves: Extraocular movements are full.   Facial symmetry is present. There is good facial sensation to soft touch bilaterally.Facial strength is normal.  Trapezius and sternocleidomastoid strength is normal. No dysarthria is noted.  The tongue is midline, and the patient has symmetric elevation of the soft palate. No obvious hearing deficits are noted.  Motor:  Muscle bulk is normal but tone is increased. Strength is  5 / 5 in all 4 extremities.   Sensory: Sensory testing is intact to touch, vibration sensation on all 4 extremities.  Coordination: Cerebellar testing reveals good finger-nose-finger and heel-to-shin bilaterally.  Gait and station: Station and gait are normal. Tandem gait is wide. Romberg is negative.   Reflexes: Deep tendon reflexes are symmetric and normal bilaterally.      DIAGNOSTIC DATA (LABS, IMAGING, TESTING) - I reviewed patient records, labs, notes, testing and imaging myself where available.  Lab Results  Component Value Date   WBC 3.0* 06/21/2014   HGB 13.1 06/21/2014   HCT 39.4 06/21/2014   MCV 85.1 06/21/2014   PLT 224 06/21/2014      Component Value Date/Time   NA 140 06/21/2014 0452   K 3.9 06/21/2014 0452   CL 105 06/21/2014 0452   CO2 22 06/21/2014 0452   GLUCOSE 124* 06/21/2014 0452   BUN 13 06/21/2014 0452   CREATININE 0.49* 06/21/2014 0452   CALCIUM 8.5 06/21/2014 0452   PROT 6.5 06/21/2014 0452   ALBUMIN 3.5 06/21/2014 0452   AST 21 06/21/2014 0452   ALT 36* 06/21/2014 0452   ALKPHOS 56 06/21/2014 0452   BILITOT 0.3 06/21/2014 0452   GFRNONAA >90 06/21/2014 0452   GFRAA >90 06/21/2014 0452   No results found for: CHOL, HDL, LDLCALC, LDLDIRECT, TRIG, CHOLHDL No results found for: HGBA1C  No results found for: VITAMINB12 Lab Results    Component Value Date   TSH 0.042* 06/21/2014        ASSESSMENT AND PLAN  Multiple sclerosis ([facility])  Ataxic gait  Chronic fatigue  Vertigo  Meniere disease, unspecified laterality     1.  Continue Aubagio.    She gets blood work regularly with her endocrinologist 2.  Dyazide for her Mnire's disease prn valium up to 4/day during an episode. 3.   qd Valium for her spasticity and Ambien for sleep. 3.  She will continue to stay active, exercise as tolerated and try to lose a little bit more weight.  She will return to see me in about 6  months, sooner if she has new or worsening neurologic symptoms.    Lindley Stachnik A. Felecia Shelling, MD, PhD 8/61/4830, 73:54 AM Certified in Neurology, Clinical Neurophysiology, Sleep Medicine, Pain Medicine and Neuroimaging  [facility] [address], [place], [place] [address] [phone]

## 2015-11-21 ENCOUNTER — Telehealth: Payer: Self-pay | Admitting: Neurology

## 2015-11-21 ENCOUNTER — Encounter: Payer: Self-pay | Admitting: *Deleted

## 2015-11-21 NOTE — Telephone Encounter (Signed)
Letter awaiting RAS sig/fim

## 2015-11-21 NOTE — Telephone Encounter (Signed)
Patient is calling and states she has been summoned for jury duty on 01/03/2016 and needs a letter stating she is unable to serve.  She states this needs to be addressed at least 10 days prior for them to accept.  Please call when ready. Thanks!

## 2015-11-21 NOTE — Telephone Encounter (Signed)
I have spoken with Leslie Ryan and advised Pearl's letter for jury duty is ready to be picked up at the office/fim

## 2015-12-29 DIAGNOSIS — Z79899 Other long term (current) drug therapy: Secondary | ICD-10-CM | POA: Diagnosis not present

## 2015-12-29 DIAGNOSIS — E784 Other hyperlipidemia: Secondary | ICD-10-CM | POA: Diagnosis not present

## 2015-12-29 DIAGNOSIS — E049 Nontoxic goiter, unspecified: Secondary | ICD-10-CM | POA: Diagnosis not present

## 2016-01-04 DIAGNOSIS — H8110 Benign paroxysmal vertigo, unspecified ear: Secondary | ICD-10-CM | POA: Diagnosis not present

## 2016-01-04 DIAGNOSIS — G35 Multiple sclerosis: Secondary | ICD-10-CM | POA: Diagnosis not present

## 2016-01-04 DIAGNOSIS — E049 Nontoxic goiter, unspecified: Secondary | ICD-10-CM | POA: Diagnosis not present

## 2016-01-04 DIAGNOSIS — Z1389 Encounter for screening for other disorder: Secondary | ICD-10-CM | POA: Diagnosis not present

## 2016-01-04 DIAGNOSIS — Z23 Encounter for immunization: Secondary | ICD-10-CM | POA: Diagnosis not present

## 2016-01-04 DIAGNOSIS — Z79899 Other long term (current) drug therapy: Secondary | ICD-10-CM | POA: Diagnosis not present

## 2016-01-04 DIAGNOSIS — K219 Gastro-esophageal reflux disease without esophagitis: Secondary | ICD-10-CM | POA: Diagnosis not present

## 2016-01-04 DIAGNOSIS — Z0000 Encounter for general adult medical examination without abnormal findings: Secondary | ICD-10-CM | POA: Diagnosis not present

## 2016-01-04 DIAGNOSIS — E784 Other hyperlipidemia: Secondary | ICD-10-CM | POA: Diagnosis not present

## 2016-01-04 DIAGNOSIS — F419 Anxiety disorder, unspecified: Secondary | ICD-10-CM | POA: Diagnosis not present

## 2016-02-02 ENCOUNTER — Encounter: Payer: Medicare Other | Admitting: Women's Health

## 2016-02-15 ENCOUNTER — Encounter: Payer: Self-pay | Admitting: Women's Health

## 2016-02-15 ENCOUNTER — Ambulatory Visit (INDEPENDENT_AMBULATORY_CARE_PROVIDER_SITE_OTHER): Payer: Medicare Other | Admitting: Women's Health

## 2016-02-15 VITALS — BP 122/82 | Ht 63.0 in | Wt 163.4 lb

## 2016-02-15 DIAGNOSIS — Z01419 Encounter for gynecological examination (general) (routine) without abnormal findings: Secondary | ICD-10-CM

## 2016-02-15 DIAGNOSIS — N952 Postmenopausal atrophic vaginitis: Secondary | ICD-10-CM

## 2016-02-15 NOTE — Progress Notes (Signed)
Avyn [facility] May 13, 1951 [id number]   History:    Presents for breast and pelvic exam.  1999 TAH with BSO for endometriosis, no HRT, no bleeding.  Not sexually active/husband's health.  1977 cervical dysplasia with normal Paps since.  Normal mammogram history.  [place] getting out of the shower a few weeks ago causing hematoma on right side, annual mammogram scheduled for next month after hematoma heals.  2013 benign colon polyp.  2014 DEXA with T score of -0.7 at right hip.  Multiple sclerosis, celiac disease, hypertension, hypothyroidism, Dupuytren's disease of right hand, and Meniere's disease managed by PCP and neurologist. History of a thyroid nodule, endocrinologist managing   Past medical history, past surgical history, family history and social history were all reviewed and documented in the EPIC chart.  Caring for husband with Alzheimer's and mother with breast cancer with metastasis to colon.  Adopted daughter, history of substance abuse, completed rehab and is living at home now.  Has full custody of granddaughter, 55.  Recent trip to [place], planning trips to [place] and [place] for later this year.   ROS:  A ROS was performed and pertinent positives and negatives are included.  Exam:  Filed Vitals:   02/15/16 1409  BP: 122/82    General appearance:  Normal Thyroid:  Symmetrical, normal in size, small palpable nodule on right side-always per pt. Respiratory  Auscultation:  Clear without wheezing or rhonchi Cardiovascular  Auscultation:  Regular rate, without rubs, murmurs or gallops  Edema/varicosities:  Not grossly evident Abdominal  Soft,nontender, without masses, guarding or rebound.  Liver/spleen:  No organomegaly noted  Hernia:  None appreciated  Skin  Inspection:  Grossly normal   Breasts: Examined lying and sitting.     Right: Without masses, retractions, discharge or axillary adenopathy.  Tender to palpation upper quadrant 11-12 o'clock.     Left: Without  masses, retractions, discharge or axillary adenopathy. Gentitourinary   Inguinal/mons:  Normal without inguinal adenopathy  External genitalia:  Normal  BUS/Urethra/Skene's glands:  Normal  Vagina:  Atrophic with mild erythema  Cervix:  Absent  Uterus:  Absent  Adnexa/parametria:     Rt: Without masses or tenderness.   Lt: Without masses or tenderness.  Anus and perineum: Normal  Digital rectal exam: Normal sphincter tone without palpated masses or tenderness  Assessment/Plan:  65 y.o. MWF G0 + 1 adopted daughter for breast and pelvic exam no complaints.    1999 TAH with BSO for endometriosis, no HRT,  Overweight Hypertension/hypercholesterolemia/Hypothyroid with small goiter, managed by PCP labs and meds  MS, Mnire's, neurologist Celiac-GI Situational stress husband with Alzheimer's, mother with terminal cancer  Plan: SBE's, continue annual 3 D mammograms, screening guidelines reviewed.  Encouraged healthy calcium-rich diet and regular exercise.  Discussed safety and fall prevention measures. Continue to delegate home responsibility as able, leisure activities for self.   Huel Cote WHNP, 3:02 PM 02/15/2016

## 2016-02-15 NOTE — Patient Instructions (Signed)

## 2016-02-27 DIAGNOSIS — Z803 Family history of malignant neoplasm of breast: Secondary | ICD-10-CM | POA: Diagnosis not present

## 2016-02-27 DIAGNOSIS — Z1231 Encounter for screening mammogram for malignant neoplasm of breast: Secondary | ICD-10-CM | POA: Diagnosis not present

## 2016-03-07 ENCOUNTER — Encounter: Payer: Self-pay | Admitting: Women's Health

## 2016-03-14 ENCOUNTER — Encounter: Payer: Self-pay | Admitting: Neurology

## 2016-03-14 ENCOUNTER — Ambulatory Visit (INDEPENDENT_AMBULATORY_CARE_PROVIDER_SITE_OTHER): Payer: Medicare Other | Admitting: Neurology

## 2016-03-14 VITALS — BP 112/74 | HR 72 | Resp 16 | Wt 162.0 lb

## 2016-03-14 DIAGNOSIS — R5382 Chronic fatigue, unspecified: Secondary | ICD-10-CM | POA: Diagnosis not present

## 2016-03-14 DIAGNOSIS — G4700 Insomnia, unspecified: Secondary | ICD-10-CM

## 2016-03-14 DIAGNOSIS — R260 Ataxic gait: Secondary | ICD-10-CM

## 2016-03-14 DIAGNOSIS — H8109 Meniere's disease, unspecified ear: Secondary | ICD-10-CM

## 2016-03-14 DIAGNOSIS — R42 Dizziness and giddiness: Secondary | ICD-10-CM | POA: Diagnosis not present

## 2016-03-14 DIAGNOSIS — G35 Multiple sclerosis: Secondary | ICD-10-CM

## 2016-03-14 MED ORDER — TERIFLUNOMIDE 14 MG PO TABS: 14.0000 mg | ORAL_TABLET | Freq: Every day | ORAL | 3 refills | Status: DC

## 2016-03-14 MED ORDER — DIAZEPAM 5 MG PO TABS: ORAL_TABLET | ORAL | 1 refills | Status: DC

## 2016-03-14 MED ORDER — ZOLPIDEM TARTRATE 10 MG PO TABS: 10.0000 mg | ORAL_TABLET | Freq: Every day | ORAL | 1 refills | Status: DC

## 2016-03-14 MED ORDER — TRIAMTERENE-HCTZ 37.5-25 MG PO CAPS: 1.0000 | ORAL_CAPSULE | Freq: Every day | ORAL | 3 refills | Status: DC

## 2016-03-14 NOTE — Progress Notes (Signed)
[facility]  PATIENT: Leslie Ryan DOB: 01/20/1951  REFERRING CLINICIAN: Burnard Bunting HISTORY FROM: patient REASON FOR VISIT: MS   HISTORICAL  CHIEF COMPLAINT:  Chief Complaint  Patient presents with  . Multiple Sclerosis    Sts. she continues to tolerate Aubagio well.  Also continues Valium and Dyazide for Meniere's Dz. Denies new or worsening sx./fim  . Meniere's Disease    HISTORY OF PRESENT ILLNESS:  Leslie Ryan is a 65 year old woman with multiple sclerosis.   She has been on Aubagio since 2014 and is tolerating it well. She gets labwork several times a year with Dr. Reynaldo Minium (PCP)  She denies any new exacerbation.  Gait/strength/sensatoin:  Gait is stable but mildly spastic.   She stumbles some but no falls.     She gets osccasional severe leg cramps.       She notes mild weakness in the legs. Diazepam has helped a little bit with this spasticity.  She is not exercising because she feels so weak afterwards.    Bladder/Bowel:  She denies any significant difficulty with her bladder.   Vision:  Vision is doing ok.   No changes..   She has been diagnosed with macular degeneration  Fatigue/sleep:   She also has fatigue, better since losing weight.  She feels her fatigue is more physical than mental.  She has insomnia that is helped by Ambien.   Insomnia was more of a problem with sleep onset.   Once asleep, she usually gets at least 5 hours slepe    Mood/Cognition:    She has had depression, worth with more stress (mother is terminal; husband has AD).   She is on fluoxetine.  She also occasionally takes diazepam for anxiety.   She has no significant cognitive issues but notes mild memory problems at times.    Meniere's:  She has Mnire's disease with severe vertigo and N/V.  She has had a couple milder episodes quickly improved with valium .  She is on Dyazide and takes a valium if an episode occurs.   We dicussed taking more valium on those rare  days.      MS History:  In the mid 1980s, she presented with right optic neuritis while working at [employer] [facility]. A lumbar puncture was inconclusive but an MRI of the brain was consistent with MS a few years later. In the 1980s and early 1990s, she received multiple rounds of IV steroids for occasional exacerbations involving vision or gait. In 1994, she started Betaseron stopped quickly due to severe injection site reactions. In January 1997, after an exacerbation she was started on Avonex and continued on Avonex for the next 15 years or so. She had some issues with compliance on some occasions.   In the late 1990s, she began to have more permanent gait issues the right foot drop that slowly worsened. I saw early MRI reports from 2000 showing 2 foci at C6 and C7 on the cervical spine and predominantly periventricular foci on brain MRI.   On 02/20/2013, she had the sudden onset of vertigo that was not altered by movements. Her gait worsened with ataxia and she felt like she was being pushed to the right as she walked. She did not note any numbness or weakness or visual changes. An MRI of the brain on 02/25/2013 showed multiple periventricular white matter lesions consistent with MS. She received 3 days of IV steroids with benefit. I personally reviewed the MRI and felt that there were  3 new lesions in the left frontal parietal out of that were not present on her prior MRI dated 08/08/2010 the foci enhanced. Due to the exacerbation and the MRI changes, she switched to Aubagio in 2014. She tolerates it well and liver function tests have been fine.   REVIEW OF SYSTEMS:  Constitutional: No fevers, chills, sweats, or change in appetite Eyes: No visual changes, double vision, eye pain Ear, nose and throat: No hearing loss, ear pain, nasal congestion, sore throat Cardiovascular: No chest pain, palpitations Respiratory:  No shortness of breath at rest or with exertion.   No wheezes GastrointestinaI: No  nausea, vomiting, diarrhea, abdominal pain, fecal incontinence Genitourinary:  No dysuria, urinary retention or frequency.  No nocturia. Musculoskeletal:  No neck pain, back pain Integumentary: No rash, pruritus, skin lesions Neurological: as above Psychiatric: No depression at this time.  No anxiety Endocrine: No palpitations, diaphoresis, change in appetite, change in weigh or increased thirst.   She has felt cold since thyroid med's changed Hematologic/Lymphatic:  No anemia, purpura, petechiae. Allergic/Immunologic: No itchy/runny eyes, nasal congestion, recent allergic reactions, rashes  ALLERGIES: Allergies  Allergen Reactions  . Erythromycin Other (See Comments)    Abdominal cramping  . Morphine And Related     Patient described heaviness and loss of feeling in legs coupled with a squeezing feeling in the epigastric region. Requested no further Morphine doses.       HOME MEDICATIONS: Outpatient Medications Prior to Visit  Medication Sig Dispense Refill  . aspirin 81 MG tablet Take 81 mg by mouth daily.     . Cholecalciferol (VITAMIN D-3) 5000 UNITS TABS Take 1 tablet by mouth every other day.     . Cyclobenzaprine HCl (FLEXERIL PO) Take 1 tablet by mouth 3 (three) times daily as needed (muscle spasms). Reported on 02/15/2016    . FLUoxetine (PROZAC) 20 MG capsule Take 20 mg by mouth daily.      . lansoprazole (PREVACID) 30 MG capsule Take 30 mg by mouth daily.     Marland Kitchen levothyroxine (SYNTHROID, LEVOTHROID) 112 MCG tablet Take 112 mcg by mouth daily.      . pravastatin (PRAVACHOL) 40 MG tablet Take 20 mg by mouth daily. Reported on 02/15/2016    . vitamin E 400 UNIT capsule Take 400 Units by mouth daily.      . diazepam (VALIUM) 5 MG tablet One or two po twice daily 135 tablet 1  . Teriflunomide (AUBAGIO) 14 MG TABS Take 14 mg by mouth daily. 90 tablet 3  . triamterene-hydrochlorothiazide (DYAZIDE) 37.5-25 MG capsule Take 1 each (1 capsule total) by mouth daily. 90 capsule 3  .  zolpidem (AMBIEN) 10 MG tablet Take 1 tablet (10 mg total) by mouth at bedtime. 90 tablet 1   No facility-administered medications prior to visit.     PAST MEDICAL HISTORY: Past Medical History:  Diagnosis Date  . Celiac disease   . Cervical dysplasia   . Endometriosis   . Hearing loss   . High cholesterol   . Hypothyroidism   . Multiple sclerosis ([place])   . Vision abnormalities     PAST SURGICAL HISTORY: Past Surgical History:  Procedure Laterality Date  . ABDOMINAL HYSTERECTOMY  1999   TAH,BSO menorrhgia,,endometriosis  . ABDOMINAL HYSTERECTOMY    . CHOLECYSTECTOMY    . CHOLECYSTECTOMY    . COLPOSCOPY    . FINGER SURGERY    . GYNECOLOGIC CRYOSURGERY  1987  . OOPHORECTOMY    . ROTATOR CUFF REPAIR    .  ROTATOR CUFF REPAIR Right     FAMILY HISTORY: Family History  Problem Relation Age of Onset  . Breast cancer Mother     age 108's  . Cancer Mother     UTERINE  . Colon cancer Mother   . Diabetes Father   . Hypertension Father   . Heart disease Father   . Cancer Maternal Grandfather     Stomach cancer  . Heart disease Paternal Grandfather     SOCIAL HISTORY:  Social History   Social History  . Marital status: Married    Spouse name: N/A  . Number of children: N/A  . Years of education: N/A   Occupational History  . Not on file.   Social History Main Topics  . Smoking status: Former Research scientist (life sciences)  . Smokeless tobacco: Not on file  . Alcohol use No  . Drug use: No  . Sexual activity: No   Other Topics Concern  . Not on file   Social History Narrative  . No narrative on file     PHYSICAL EXAM  Vitals:   03/14/16 1304  BP: 112/74  Pulse: 72  Resp: 16  Weight: 162 lb (73.5 kg)    Body mass index is 28.7 kg/m.   General: The patient is well-developed and well-nourished and in no acute distress  Neck:.  The neck is nontender with good ROM  Skin: Extremities are without significant edema.  Neurologic Exam  Mental status: The patient is  alert and oriented x 3 at the time of the examination. The patient has apparent normal recent and remote memory, with an apparently normal attention span and concentration ability.   Speech is normal.  Cranial nerves: Extraocular movements are full.   Facial symmetry is present. There is good facial sensation to soft touch bilaterally.Facial strength is normal.  Trapezius and sternocleidomastoid strength is normal. No dysarthria is noted.  The tongue is midline, and the patient has symmetric elevation of the soft palate. No obvious hearing deficits are noted.  Motor:  Muscle bulk is normal but tone is increased. Strength is  5 / 5 in all 4 extremities.   Sensory: Sensory testing is intact to touch, vibration sensation on all 4 extremities.  Coordination: Cerebellar testing reveals good finger-nose-finger and heel-to-shin bilaterally.  Gait and station: Station and gait are normal. Tandem gait is wide. Romberg is negative.   Reflexes: Deep tendon reflexes are symmetric and normal bilaterally.      DIAGNOSTIC DATA (LABS, IMAGING, TESTING) - I reviewed patient records, labs, notes, testing and imaging myself where available.  Lab Results  Component Value Date   WBC 3.0 (L) 06/21/2014   HGB 13.1 06/21/2014   HCT 39.4 06/21/2014   MCV 85.1 06/21/2014   PLT 224 06/21/2014      Component Value Date/Time   NA 140 06/21/2014 0452   K 3.9 06/21/2014 0452   CL 105 06/21/2014 0452   CO2 22 06/21/2014 0452   GLUCOSE 124 (H) 06/21/2014 0452   BUN 13 06/21/2014 0452   CREATININE 0.49 (L) 06/21/2014 0452   CALCIUM 8.5 06/21/2014 0452   PROT 6.5 06/21/2014 0452   ALBUMIN 3.5 06/21/2014 0452   AST 21 06/21/2014 0452   ALT 36 (H) 06/21/2014 0452   ALKPHOS 56 06/21/2014 0452   BILITOT 0.3 06/21/2014 0452   GFRNONAA >90 06/21/2014 0452   GFRAA >90 06/21/2014 0452   No results found for: CHOL, HDL, LDLCALC, LDLDIRECT, TRIG, CHOLHDL No results found for: HGBA1C No results  found for:  VITAMINB12 Lab Results  Component Value Date   TSH 0.042 (L) 06/21/2014        ASSESSMENT AND PLAN  Multiple sclerosis (HCC)  Meniere disease, unspecified laterality  Ataxic gait  Chronic fatigue  Insomnia  Vertigo   1.  Continue Aubagio.    She gets blood work regularly with her PCP and has been told all is fine 2.  Dyazide for her Mnire's disease prn valium up to 4/day during an episode. 3.  Valium for her spasticity, prn for Meniere's and Ambien for sleep. 4.  Increase Vit D to 2000 U daily. 5.  She will continue to stay active, exercise as tolerated .  She will return to see me in about 6  months, sooner if she has new or worsening neurologic symptoms.    Gabrille Kilbride A. Felecia Shelling, MD, PhD 3/97/6734, 1:93 PM Certified in Neurology, Clinical Neurophysiology, Sleep Medicine, Pain Medicine and Neuroimaging  [facility] [address] [place], [place] [address] [phone] 2

## 2016-03-16 ENCOUNTER — Telehealth: Payer: Self-pay | Admitting: Neurology

## 2016-03-16 MED ORDER — DIAZEPAM 5 MG PO TABS: ORAL_TABLET | ORAL | 0 refills | Status: DC

## 2016-03-16 NOTE — Telephone Encounter (Signed)
Patient called, states [facility] has advised her it will be between 14-28 days before they will ship Diazepam, they advised her, they have 10,000 orders ahead of hers. Patient request Diazepam Rx be sent to [place]

## 2016-03-16 NOTE — Telephone Encounter (Signed)
Rx. faxed to [place] with fax confirmation received/fim

## 2016-03-16 NOTE — Telephone Encounter (Signed)
Rx. awaiting [facility] sig/fim

## 2016-03-28 DIAGNOSIS — S90822A Blister (nonthermal), left foot, initial encounter: Secondary | ICD-10-CM | POA: Diagnosis not present

## 2016-04-03 DIAGNOSIS — R609 Edema, unspecified: Secondary | ICD-10-CM | POA: Diagnosis not present

## 2016-04-03 DIAGNOSIS — L0889 Other specified local infections of the skin and subcutaneous tissue: Secondary | ICD-10-CM | POA: Diagnosis not present

## 2016-05-02 ENCOUNTER — Telehealth: Payer: Self-pay | Admitting: Neurology

## 2016-05-02 MED ORDER — DIAZEPAM 5 MG PO TABS: ORAL_TABLET | ORAL | 1 refills | Status: DC

## 2016-05-02 NOTE — Telephone Encounter (Signed)
Rx. awaiting [facility] sig/fim

## 2016-05-02 NOTE — Telephone Encounter (Signed)
Pt request refill for diazepam (VALIUM) 5 MG tablet with 90 day supply to [facility]. She would like to have enough refills to last until she is seen again

## 2016-05-02 NOTE — Telephone Encounter (Signed)
Per RAS, ok for 90 day supply of Diazepam.  Rx. faxed to [facility] with fax confirmation received/fim

## 2016-06-13 DIAGNOSIS — H0015 Chalazion left lower eyelid: Secondary | ICD-10-CM | POA: Diagnosis not present

## 2016-07-30 DIAGNOSIS — Z23 Encounter for immunization: Secondary | ICD-10-CM | POA: Diagnosis not present

## 2016-07-31 DIAGNOSIS — L700 Acne vulgaris: Secondary | ICD-10-CM | POA: Diagnosis not present

## 2016-09-19 ENCOUNTER — Ambulatory Visit (INDEPENDENT_AMBULATORY_CARE_PROVIDER_SITE_OTHER): Payer: Medicare Other | Admitting: Neurology

## 2016-09-19 ENCOUNTER — Encounter: Payer: Self-pay | Admitting: Neurology

## 2016-09-19 VITALS — BP 122/68 | HR 66 | Resp 18 | Ht 63.0 in | Wt 162.0 lb

## 2016-09-19 DIAGNOSIS — G4700 Insomnia, unspecified: Secondary | ICD-10-CM | POA: Diagnosis not present

## 2016-09-19 DIAGNOSIS — F439 Reaction to severe stress, unspecified: Secondary | ICD-10-CM | POA: Diagnosis not present

## 2016-09-19 DIAGNOSIS — R260 Ataxic gait: Secondary | ICD-10-CM | POA: Diagnosis not present

## 2016-09-19 DIAGNOSIS — H8109 Meniere's disease, unspecified ear: Secondary | ICD-10-CM | POA: Diagnosis not present

## 2016-09-19 DIAGNOSIS — G35 Multiple sclerosis: Secondary | ICD-10-CM

## 2016-09-19 MED ORDER — TERIFLUNOMIDE 14 MG PO TABS: 14.0000 mg | ORAL_TABLET | Freq: Every day | ORAL | 3 refills | Status: DC

## 2016-09-19 MED ORDER — ZOLPIDEM TARTRATE 10 MG PO TABS: 10.0000 mg | ORAL_TABLET | Freq: Every day | ORAL | 1 refills | Status: DC

## 2016-09-19 MED ORDER — TRIAMTERENE-HCTZ 37.5-25 MG PO CAPS: 1.0000 | ORAL_CAPSULE | Freq: Every day | ORAL | 3 refills | Status: DC

## 2016-09-19 MED ORDER — DIAZEPAM 5 MG PO TABS: ORAL_TABLET | ORAL | 1 refills | Status: DC

## 2016-09-19 NOTE — Progress Notes (Signed)
[facility]  PATIENT: Leslie Ryan DOB: [birth year]  REFERRING CLINICIAN: Burnard Bunting HISTORY FROM: patient REASON FOR VISIT: MS   HISTORICAL  CHIEF COMPLAINT:  Chief Complaint  Patient presents with  . Multiple Sclerosis    Sts. she continues to tolerate Aubagio well.  Sts. has had 2 exacerbation of Meniere's dz. since last ov.  Sts. she did increase Vitamin D to 2,000u daily as advised/fim    HISTORY OF PRESENT ILLNESS:  Leslie Ryan is a 66 year old woman with multiple sclerosis.   She feels her MS is mostly ok but family issues are adding stress.   Her husband has AD and mother recently died  MS:   She is on [place] since 2014.   She is tolerating it well. She gets labwork several times a year with Dr. Reynaldo Minium (PCP)  She has not had any exacerbation.  Gait/strength/sensatoin:  Gait is stable.   It is mildly spastic and ataxic.   She stumbles some but no falls.   She has notes mild leg weakness, right > leg and has occasional leg cramps.    She notes mild weakness in the legs. Diazepam has helped a little bit with this spasticity.  She is not exercising because she feels so weak afterwards.    Bladder/Bowel:  She denies any significant difficulty with her bladder. Bowel function is fine.   Vision:  Vision is doing ok.   No changes..   She has been diagnosed with macular degeneration  Fatigue/sleep:   She has fatigue, worse later in the day.   Generally better since losing weight.  Fatigue is more physical than mental.  She has insomnia that is helped by Ambien.   Sleep maintenance is more of a problem than sleep onset.   Once asleep, she usually gets at least 5 hours sleep but wakes up a few times some nights  Mood/Cognition:    She has had depression, worth with stress (mother died 3 months ago and husband has AD).   She is on fluoxetine.  She also occasionally takes diazepam for anxiety.   She denies major cognitive issues but notes mild memory  problems at times.    Meniere's:  She has had a couple episodes of Mnire's disease with severe vertigo and N/V since the last visit.     Valium helps when one occurs.   She is on Dyazide.    We dicussed taking more valium on those rare days.      MS History:  In the mid 1980s, she presented with right optic neuritis while working at [employer] [facility]. A lumbar puncture was inconclusive but an MRI of the brain was consistent with MS a few years later. In the 1980s and early 1990s, she received multiple rounds of IV steroids for occasional exacerbations involving vision or gait. In 1994, she started Betaseron stopped quickly due to severe injection site reactions. In January 1997, after an exacerbation she was started on Avonex and continued on Avonex for the next 15 years or so. She had some issues with compliance on some occasions.   In the late 1990s, she began to have more permanent gait issues the right foot drop that slowly worsened. I saw early MRI reports from 2000 showing 2 foci at C6 and C7 on the cervical spine and predominantly periventricular foci on brain MRI.   On 02/20/2013, she had the sudden onset of vertigo that was not altered by movements. Her gait worsened with ataxia and she  felt like she was being pushed to the right as she walked. She did not note any numbness or weakness or visual changes. An MRI of the brain on 02/25/2013 showed multiple periventricular white matter lesions consistent with MS. She received 3 days of IV steroids with benefit. I personally reviewed the MRI and felt that there were 3 new lesions in the left frontal parietal out of that were not present on her prior MRI dated 08/08/2010 the foci enhanced. Due to the exacerbation and the MRI changes, she switched to Aubagio in 2014. She tolerates it well and liver function tests have been fine.   REVIEW OF SYSTEMS:  Constitutional: No fevers, chills, sweats, or change in appetite Eyes: No visual changes, double  vision, eye pain Ear, nose and throat: No hearing loss, ear pain, nasal congestion, sore throat Cardiovascular: No chest pain, palpitations Respiratory:  No shortness of breath at rest or with exertion.   No wheezes GastrointestinaI: No nausea, vomiting, diarrhea, abdominal pain, fecal incontinence Genitourinary:  No dysuria, urinary retention or frequency.  No nocturia. Musculoskeletal:  No neck pain, back pain Integumentary: No rash, pruritus, skin lesions Neurological: as above Psychiatric: No depression at this time.  No anxiety Endocrine: No palpitations, diaphoresis, change in appetite, change in weigh or increased thirst.   She has felt cold since thyroid med's changed Hematologic/Lymphatic:  No anemia, purpura, petechiae. Allergic/Immunologic: No itchy/runny eyes, nasal congestion, recent allergic reactions, rashes  ALLERGIES: Allergies  Allergen Reactions  . Erythromycin Other (See Comments)    Abdominal cramping  . Morphine And Related     Patient described heaviness and loss of feeling in legs coupled with a squeezing feeling in the epigastric region. Requested no further Morphine doses.       HOME MEDICATIONS: Outpatient Medications Prior to Visit  Medication Sig Dispense Refill  . aspirin 81 MG tablet Take 81 mg by mouth daily.     . Cholecalciferol (VITAMIN D-3) 5000 UNITS TABS Take 1 tablet by mouth every other day.     . diazepam (VALIUM) 5 MG tablet One or two po twice daily 180 tablet 1  . FLUoxetine (PROZAC) 20 MG capsule Take 20 mg by mouth daily.      . lansoprazole (PREVACID) 30 MG capsule Take 30 mg by mouth daily.     Marland Kitchen levothyroxine (SYNTHROID, LEVOTHROID) 112 MCG tablet Take 112 mcg by mouth daily.      . pravastatin (PRAVACHOL) 40 MG tablet Take 20 mg by mouth daily. Reported on 02/15/2016    . Teriflunomide (AUBAGIO) 14 MG TABS Take 14 mg by mouth daily. 90 tablet 3  . triamterene-hydrochlorothiazide (DYAZIDE) 37.5-25 MG capsule Take 1 each (1 capsule  total) by mouth daily. 90 capsule 3  . vitamin E 400 UNIT capsule Take 400 Units by mouth daily.      Marland Kitchen zolpidem (AMBIEN) 10 MG tablet Take 1 tablet (10 mg total) by mouth at bedtime. 90 tablet 1  . Cyclobenzaprine HCl (FLEXERIL PO) Take 1 tablet by mouth 3 (three) times daily as needed (muscle spasms). Reported on 02/15/2016     No facility-administered medications prior to visit.     PAST MEDICAL HISTORY: Past Medical History:  Diagnosis Date  . Celiac disease   . Cervical dysplasia   . Endometriosis   . Hearing loss   . High cholesterol   . Hypothyroidism   . Multiple sclerosis ([place])   . Vision abnormalities     PAST SURGICAL HISTORY: Past Surgical History:  Procedure Laterality Date  . ABDOMINAL HYSTERECTOMY  1999   TAH,BSO menorrhgia,,endometriosis  . ABDOMINAL HYSTERECTOMY    . CHOLECYSTECTOMY    . CHOLECYSTECTOMY    . COLPOSCOPY    . FINGER SURGERY    . GYNECOLOGIC CRYOSURGERY  1987  . OOPHORECTOMY    . ROTATOR CUFF REPAIR    . ROTATOR CUFF REPAIR Right     FAMILY HISTORY: Family History  Problem Relation Age of Onset  . Breast cancer Mother     age 68's  . Cancer Mother     UTERINE  . Colon cancer Mother   . Diabetes Father   . Hypertension Father   . Heart disease Father   . Cancer Maternal Grandfather     Stomach cancer  . Heart disease Paternal Grandfather     SOCIAL HISTORY:  Social History   Social History  . Marital status: Married    Spouse name: N/A  . Number of children: N/A  . Years of education: N/A   Occupational History  . Not on file.   Social History Main Topics  . Smoking status: Former Research scientist (life sciences)  . Smokeless tobacco: Never Used  . Alcohol use No  . Drug use: No  . Sexual activity: No   Other Topics Concern  . Not on file   Social History Narrative  . No narrative on file     PHYSICAL EXAM  Vitals:   09/19/16 1259  BP: 122/68  Pulse: 66  Resp: 18  Weight: 162 lb (73.5 kg)  Height: 5' 3"  (1.6 m)    Body  mass index is 28.7 kg/m.   General: The patient is well-developed and well-nourished and in no acute distress  Neck:.  The neck is nontender with good ROM   Neurologic Exam  Mental status: The patient is alert and oriented x 3 at the time of the examination. The patient has apparent normal recent and remote memory, with an apparently normal attention span and concentration ability.   Speech is normal.  Cranial nerves: Extraocular movements are full.   Facial symmetry is present. There is good facial sensation to soft touch bilaterally.Facial strength is normal.  Trapezius and sternocleidomastoid strength is normal. No dysarthria is noted.  The tongue is midline, and the patient has symmetric elevation of the soft palate. No obvious hearing deficits are noted.  Motor:  Muscle bulk is normal but tone is increased. Strength is  5 / 5 in all 4 extremities.   Sensory: Sensory testing is intact to touch, vibration sensation on all 4 extremities.  Coordination: Cerebellar testing reveals good finger-nose-finger and heel-to-shin bilaterally.  Gait and station: Station and gait are normal. Tandem gait is wide. Romberg is negative.   Reflexes: Deep tendon reflexes are symmetric and normal bilaterally.      DIAGNOSTIC DATA (LABS, IMAGING, TESTING) - I reviewed patient records, labs, notes, testing and imaging myself where available.  Lab Results  Component Value Date   WBC 3.0 (L) 06/21/2014   HGB 13.1 06/21/2014   HCT 39.4 06/21/2014   MCV 85.1 06/21/2014   PLT 224 06/21/2014      Component Value Date/Time   NA 140 06/21/2014 0452   K 3.9 06/21/2014 0452   CL 105 06/21/2014 0452   CO2 22 06/21/2014 0452   GLUCOSE 124 (H) 06/21/2014 0452   BUN 13 06/21/2014 0452   CREATININE 0.49 (L) 06/21/2014 0452   CALCIUM 8.5 06/21/2014 0452   PROT 6.5 06/21/2014 0452   ALBUMIN  3.5 06/21/2014 0452   AST 21 06/21/2014 0452   ALT 36 (H) 06/21/2014 0452   ALKPHOS 56 06/21/2014 0452   BILITOT  0.3 06/21/2014 0452   GFRNONAA >90 06/21/2014 0452   GFRAA >90 06/21/2014 0452   No results found for: CHOL, HDL, LDLCALC, LDLDIRECT, TRIG, CHOLHDL No results found for: HGBA1C No results found for: VITAMINB12 Lab Results  Component Value Date   TSH 0.042 (L) 06/21/2014        ASSESSMENT AND PLAN  Multiple sclerosis ([place]) - Plan: Comprehensive metabolic panel, CBC with Differential/Platelets  Meniere's disease, unspecified laterality  Ataxic gait  Insomnia, unspecified type  Stress at home     1.  Continue Aubagio. She will continue to get blood work with her primary care doctor. 2.  Continue dyazide for her Mnire's disease prn valium up to 4/day during an episode. 3.  Prn Valium for her spasticity, prn for Meniere's and Ambien for sleep. 4.  Advised to stay active and exercises as tolerated 5.   She will return to see me in about 6  months, sooner if she has new or worsening neurologic symptoms.    Morrissa Shein A. Felecia Shelling, MD, PhD 3/35/8251, 8:98 PM Certified in Neurology, Clinical Neurophysiology, Sleep Medicine, Pain Medicine and Neuroimaging  [facility] [address], [place], [place] [address] [phone] 2

## 2016-09-20 ENCOUNTER — Telehealth: Payer: Self-pay | Admitting: *Deleted

## 2016-09-20 LAB — CBC WITH DIFFERENTIAL/PLATELET
BASOS ABS: 0 10*3/uL (ref 0.0–0.2)
Basos: 1 %
EOS (ABSOLUTE): 0.2 10*3/uL (ref 0.0–0.4)
EOS: 6 %
HEMATOCRIT: 39.4 % (ref 34.0–46.6)
HEMOGLOBIN: 13.4 g/dL (ref 11.1–15.9)
IMMATURE GRANULOCYTES: 0 %
Immature Grans (Abs): 0 10*3/uL (ref 0.0–0.1)
Lymphocytes Absolute: 1 10*3/uL (ref 0.7–3.1)
Lymphs: 26 %
MCH: 28.3 pg (ref 26.6–33.0)
MCHC: 34 g/dL (ref 31.5–35.7)
MCV: 83 fL (ref 79–97)
MONOCYTES: 14 %
Monocytes Absolute: 0.5 10*3/uL (ref 0.1–0.9)
NEUTROS PCT: 53 %
Neutrophils Absolute: 2.1 10*3/uL (ref 1.4–7.0)
Platelets: 219 10*3/uL (ref 150–379)
RBC: 4.74 x10E6/uL (ref 3.77–5.28)
RDW: 13.3 % (ref 12.3–15.4)
WBC: 3.9 10*3/uL (ref 3.4–10.8)

## 2016-09-20 LAB — COMPREHENSIVE METABOLIC PANEL
ALK PHOS: 67 IU/L (ref 39–117)
ALT: 20 IU/L (ref 0–32)
AST: 17 IU/L (ref 0–40)
Albumin/Globulin Ratio: 1.6 (ref 1.2–2.2)
Albumin: 4.2 g/dL (ref 3.6–4.8)
BUN/Creatinine Ratio: 17 (ref 12–28)
BUN: 15 mg/dL (ref 8–27)
Bilirubin Total: 0.4 mg/dL (ref 0.0–1.2)
CO2: 25 mmol/L (ref 18–29)
Calcium: 9.5 mg/dL (ref 8.7–10.3)
Chloride: 90 mmol/L — ABNORMAL LOW (ref 96–106)
Creatinine, Ser: 0.9 mg/dL (ref 0.57–1.00)
GFR calc Af Amer: 77 mL/min/{1.73_m2} (ref >59)
GFR calc non Af Amer: 67 mL/min/{1.73_m2} (ref >59)
GLUCOSE: 108 mg/dL — AB (ref 65–99)
Globulin, Total: 2.6 g/dL (ref 1.5–4.5)
Potassium: 3.5 mmol/L (ref 3.5–5.2)
Sodium: 135 mmol/L (ref 134–144)
Total Protein: 6.8 g/dL (ref 6.0–8.5)

## 2016-09-20 NOTE — Telephone Encounter (Signed)
I have spoken with Leslie Ryan this morning and per RAS, advised that labwork done in our office looks ok.  She verbalized understanding of same/fim

## 2016-09-20 NOTE — Telephone Encounter (Signed)
-----   Message from Britt Bottom, MD sent at 09/20/2016  8:26 AM EST ----- Please let her know the labs are fine

## 2016-09-26 DIAGNOSIS — F4310 Post-traumatic stress disorder, unspecified: Secondary | ICD-10-CM | POA: Diagnosis not present

## 2016-10-10 DIAGNOSIS — J029 Acute pharyngitis, unspecified: Secondary | ICD-10-CM | POA: Diagnosis not present

## 2016-12-31 DIAGNOSIS — Z79899 Other long term (current) drug therapy: Secondary | ICD-10-CM | POA: Diagnosis not present

## 2016-12-31 DIAGNOSIS — E049 Nontoxic goiter, unspecified: Secondary | ICD-10-CM | POA: Diagnosis not present

## 2016-12-31 DIAGNOSIS — E784 Other hyperlipidemia: Secondary | ICD-10-CM | POA: Diagnosis not present

## 2017-01-02 ENCOUNTER — Encounter: Payer: Self-pay | Admitting: Gynecology

## 2017-01-07 DIAGNOSIS — H8110 Benign paroxysmal vertigo, unspecified ear: Secondary | ICD-10-CM | POA: Diagnosis not present

## 2017-01-07 DIAGNOSIS — F419 Anxiety disorder, unspecified: Secondary | ICD-10-CM | POA: Diagnosis not present

## 2017-01-07 DIAGNOSIS — G35 Multiple sclerosis: Secondary | ICD-10-CM | POA: Diagnosis not present

## 2017-01-07 DIAGNOSIS — E049 Nontoxic goiter, unspecified: Secondary | ICD-10-CM | POA: Diagnosis not present

## 2017-01-07 DIAGNOSIS — Z6828 Body mass index (BMI) 28.0-28.9, adult: Secondary | ICD-10-CM | POA: Diagnosis not present

## 2017-01-07 DIAGNOSIS — Z0000 Encounter for general adult medical examination without abnormal findings: Secondary | ICD-10-CM | POA: Diagnosis not present

## 2017-01-07 DIAGNOSIS — E784 Other hyperlipidemia: Secondary | ICD-10-CM | POA: Diagnosis not present

## 2017-02-15 ENCOUNTER — Encounter: Payer: Medicare Other | Admitting: Women's Health

## 2017-03-05 ENCOUNTER — Encounter: Payer: Self-pay | Admitting: Women's Health

## 2017-03-05 DIAGNOSIS — Z803 Family history of malignant neoplasm of breast: Secondary | ICD-10-CM | POA: Diagnosis not present

## 2017-03-05 DIAGNOSIS — Z780 Asymptomatic menopausal state: Secondary | ICD-10-CM | POA: Diagnosis not present

## 2017-03-05 DIAGNOSIS — Z1231 Encounter for screening mammogram for malignant neoplasm of breast: Secondary | ICD-10-CM | POA: Diagnosis not present

## 2017-03-06 ENCOUNTER — Encounter: Payer: Self-pay | Admitting: Women's Health

## 2017-03-06 ENCOUNTER — Ambulatory Visit (INDEPENDENT_AMBULATORY_CARE_PROVIDER_SITE_OTHER): Payer: Medicare Other | Admitting: Women's Health

## 2017-03-06 VITALS — BP 124/76 | Ht 63.0 in | Wt 151.0 lb

## 2017-03-06 DIAGNOSIS — Z01419 Encounter for gynecological examination (general) (routine) without abnormal findings: Secondary | ICD-10-CM

## 2017-03-06 NOTE — Patient Instructions (Signed)
Health Maintenance for Postmenopausal Women Menopause is a normal process in which your reproductive ability comes to an end. This process happens gradually over a span of months to years, usually between the ages of 26 and 60. Menopause is complete when you have missed 12 consecutive menstrual periods. It is important to talk with your health care provider about some of the most common conditions that affect postmenopausal women, such as heart disease, cancer, and bone loss (osteoporosis). Adopting a healthy lifestyle and getting preventive care can help to promote your health and wellness. Those actions can also lower your chances of developing some of these common conditions. What should I know about menopause? During menopause, you may experience a number of symptoms, such as:  Moderate-to-severe hot flashes.  Night sweats.  Decrease in sex drive.  Mood swings.  Headaches.  Tiredness.  Irritability.  Memory problems.  Insomnia.  Choosing to treat or not to treat menopausal changes is an individual decision that you make with your health care provider. What should I know about hormone replacement therapy and supplements? Hormone therapy products are effective for treating symptoms that are associated with menopause, such as hot flashes and night sweats. Hormone replacement carries certain risks, especially as you become older. If you are thinking about using estrogen or estrogen with progestin treatments, discuss the benefits and risks with your health care provider. What should I know about heart disease and stroke? Heart disease, heart attack, and stroke become more likely as you age. This may be due, in part, to the hormonal changes that your body experiences during menopause. These can affect how your body processes dietary fats, triglycerides, and cholesterol. Heart attack and stroke are both medical emergencies. There are many things that you can do to help prevent heart disease  and stroke:  Have your blood pressure checked at least every 1-2 years. High blood pressure causes heart disease and increases the risk of stroke.  If you are 51-78 years old, ask your health care provider if you should take aspirin to prevent a heart attack or a stroke.  Do not use any tobacco products, including cigarettes, chewing tobacco, or electronic cigarettes. If you need help quitting, ask your health care provider.  It is important to eat a healthy diet and maintain a healthy weight. ? Be sure to include plenty of vegetables, fruits, low-fat dairy products, and lean protein. ? Avoid eating foods that are high in solid fats, added sugars, or salt (sodium).  Get regular exercise. This is one of the most important things that you can do for your health. ? Try to exercise for at least 150 minutes each week. The type of exercise that you do should increase your heart rate and make you sweat. This is known as moderate-intensity exercise. ? Try to do strengthening exercises at least twice each week. Do these in addition to the moderate-intensity exercise.  Know your numbers.Ask your health care provider to check your cholesterol and your blood glucose. Continue to have your blood tested as directed by your health care provider.  What should I know about cancer screening? There are several types of cancer. Take the following steps to reduce your risk and to catch any cancer development as early as possible. Breast Cancer  Practice breast self-awareness. ? This means understanding how your breasts normally appear and feel. ? It also means doing regular breast self-exams. Let your health care provider know about any changes, no matter how small.  If you are 40  or older, have a clinician do a breast exam (clinical breast exam or CBE) every year. Depending on your age, family history, and medical history, it may be recommended that you also have a yearly breast X-ray (mammogram).  If you  have a family history of breast cancer, talk with your health care provider about genetic screening.  If you are at high risk for breast cancer, talk with your health care provider about having an MRI and a mammogram every year.  Breast cancer (BRCA) gene test is recommended for women who have family members with BRCA-related cancers. Results of the assessment will determine the need for genetic counseling and BRCA1 and for BRCA2 testing. BRCA-related cancers include these types: ? Breast. This occurs in males or females. ? Ovarian. ? Tubal. This may also be called fallopian tube cancer. ? Cancer of the abdominal or pelvic lining (peritoneal cancer). ? Prostate. ? Pancreatic.  Cervical, Uterine, and Ovarian Cancer Your health care provider may recommend that you be screened regularly for cancer of the pelvic organs. These include your ovaries, uterus, and vagina. This screening involves a pelvic exam, which includes checking for microscopic changes to the surface of your cervix (Pap test).  For women ages 21-65, health care providers may recommend a pelvic exam and a Pap test every three years. For women ages 79-65, they may recommend the Pap test and pelvic exam, combined with testing for human papilloma virus (HPV), every five years. Some types of HPV increase your risk of cervical cancer. Testing for HPV may also be done on women of any age who have unclear Pap test results.  Other health care providers may not recommend any screening for nonpregnant women who are considered low risk for pelvic cancer and have no symptoms. Ask your health care provider if a screening pelvic exam is right for you.  If you have had past treatment for cervical cancer or a condition that could lead to cancer, you need Pap tests and screening for cancer for at least 20 years after your treatment. If Pap tests have been discontinued for you, your risk factors (such as having a new sexual partner) need to be  reassessed to determine if you should start having screenings again. Some women have medical problems that increase the chance of getting cervical cancer. In these cases, your health care provider may recommend that you have screening and Pap tests more often.  If you have a family history of uterine cancer or ovarian cancer, talk with your health care provider about genetic screening.  If you have vaginal bleeding after reaching menopause, tell your health care provider.  There are currently no reliable tests available to screen for ovarian cancer.  Lung Cancer Lung cancer screening is recommended for adults 69-62 years old who are at high risk for lung cancer because of a history of smoking. A yearly low-dose CT scan of the lungs is recommended if you:  Currently smoke.  Have a history of at least 30 pack-years of smoking and you currently smoke or have quit within the past 15 years. A pack-year is smoking an average of one pack of cigarettes per day for one year.  Yearly screening should:  Continue until it has been 15 years since you quit.  Stop if you develop a health problem that would prevent you from having lung cancer treatment.  Colorectal Cancer  This type of cancer can be detected and can often be prevented.  Routine colorectal cancer screening usually begins at  age 42 and continues through age 45.  If you have risk factors for colon cancer, your health care provider may recommend that you be screened at an earlier age.  If you have a family history of colorectal cancer, talk with your health care provider about genetic screening.  Your health care provider may also recommend using home test kits to check for hidden blood in your stool.  A small camera at the end of a tube can be used to examine your colon directly (sigmoidoscopy or colonoscopy). This is done to check for the earliest forms of colorectal cancer.  Direct examination of the colon should be repeated every  5-10 years until age 71. However, if early forms of precancerous polyps or small growths are found or if you have a family history or genetic risk for colorectal cancer, you may need to be screened more often.  Skin Cancer  Check your skin from head to toe regularly.  Monitor any moles. Be sure to tell your health care provider: ? About any new moles or changes in moles, especially if there is a change in a mole's shape or color. ? If you have a mole that is larger than the size of a pencil eraser.  If any of your family members has a history of skin cancer, especially at a young age, talk with your health care provider about genetic screening.  Always use sunscreen. Apply sunscreen liberally and repeatedly throughout the day.  Whenever you are outside, protect yourself by wearing long sleeves, pants, a wide-brimmed hat, and sunglasses.  What should I know about osteoporosis? Osteoporosis is a condition in which bone destruction happens more quickly than new bone creation. After menopause, you may be at an increased risk for osteoporosis. To help prevent osteoporosis or the bone fractures that can happen because of osteoporosis, the following is recommended:  If you are 46-71 years old, get at least 1,000 mg of calcium and at least 600 mg of vitamin D per day.  If you are older than age 55 but younger than age 65, get at least 1,200 mg of calcium and at least 600 mg of vitamin D per day.  If you are older than age 54, get at least 1,200 mg of calcium and at least 800 mg of vitamin D per day.  Smoking and excessive alcohol intake increase the risk of osteoporosis. Eat foods that are rich in calcium and vitamin D, and do weight-bearing exercises several times each week as directed by your health care provider. What should I know about how menopause affects my mental health? Depression may occur at any age, but it is more common as you become older. Common symptoms of depression  include:  Low or sad mood.  Changes in sleep patterns.  Changes in appetite or eating patterns.  Feeling an overall lack of motivation or enjoyment of activities that you previously enjoyed.  Frequent crying spells.  Talk with your health care provider if you think that you are experiencing depression. What should I know about immunizations? It is important that you get and maintain your immunizations. These include:  Tetanus, diphtheria, and pertussis (Tdap) booster vaccine.  Influenza every year before the flu season begins.  Pneumonia vaccine.  Shingles vaccine.  Your health care provider may also recommend other immunizations. This information is not intended to replace advice given to you by your health care provider. Make sure you discuss any questions you have with your health care provider. Document Released: 09/28/2005  Document Revised: 02/24/2016 Document Reviewed: 05/10/2015 Elsevier Interactive Patient Education  2018 [employer]  

## 2017-03-06 NOTE — Progress Notes (Signed)
Leslie Ryan [facility] Dec 28, 1950 [id number]    History:    Presents for annual exam.  1999 TAH with BSO for endometriosis on no HRT. Not sexually active in years, husband has Alzheimer's , attends senior daycare center 5 days weekly. Abnormal Pap in 1977 normal Paps after. Normal mammogram history. 2012 benign colon polyps follow-up 2019. 2014 T score -0.7 and hip had a repeat DEXA last week at [facility].  Mother breast cancer triple negative BRCA negative. Hypertension, hypercholesterolemia, hyperthyroidism primary care manages labs and meds. MS and Mnire's neurologist manages.  Past medical history, past surgical history, family history and social history were all reviewed and documented in the EPIC chart.67 year old granddaughter lives with her full-time. Her adopted daughter had a substance abuse problem is currently in prison. Sister currently living with her, poor relationship and would like her to move out.  ROS:  A ROS was performed and pertinent positives and negatives are included.  Exam:  Vitals:   03/06/17 1404  BP: 124/76  Weight: 151 lb (68.5 kg)  Height: 5' 3"  (1.6 m)   Body mass index is 26.75 kg/m.   General appearance:  Normal Thyroid:  Symmetrical, normal in size, without palpable masses or nodularity. Respiratory  Auscultation:  Clear without wheezing or rhonchi Cardiovascular  Auscultation:  Regular rate, without rubs, murmurs or gallops  Edema/varicosities:  Not grossly evident Abdominal  Soft,nontender, without masses, guarding or rebound.  Liver/spleen:  No organomegaly noted  Hernia:  None appreciated  Skin  Inspection:  Grossly normal   Breasts: Examined lying and sitting.     Right: Without masses, retractions, discharge or axillary adenopathy.     Left: Without masses, retractions, discharge or axillary adenopathy. Gentitourinary   Inguinal/mons:  Normal without inguinal adenopathy  External genitalia:  Normal  BUS/Urethra/Skene's glands:   Normal  Vagina: Atrophic  Cervix:  Uterus absent  Adnexa/parametria:     Rt: Without masses or tenderness.   Lt: Without masses or tenderness.  Anus and perineum: Normal  Digital rectal exam: Normal sphincter tone without palpated masses or tenderness  Assessment/Plan:  66 y.o. MWF G0 +1 adopted, 67 year old granddaughter full custody for breast and pelvic exam.  1999 TAH with BSO on no HRT Hypothyroid/hypertension/hypercholesterolemia-primary care manages labs and meds MS, Mnire's-neurologist manages Extreme situational stress/husband severe Alzheimer's  Plan: SBE's, continue annual screening mammogram, calcium rich diet, vitamin D 2000 daily encouraged. Reviewed importance of home safety, fall prevention and weight bearing exercise. Continue counseling as needed, support system for Alzheimer's encouraged.  2 year recall.    Huel Cote [facility], 2:50 PM 03/06/2017

## 2017-03-27 ENCOUNTER — Ambulatory Visit (INDEPENDENT_AMBULATORY_CARE_PROVIDER_SITE_OTHER): Payer: Medicare Other | Admitting: Neurology

## 2017-03-27 ENCOUNTER — Encounter: Payer: Self-pay | Admitting: Neurology

## 2017-03-27 ENCOUNTER — Other Ambulatory Visit: Payer: Self-pay | Admitting: *Deleted

## 2017-03-27 VITALS — BP 122/72 | HR 72 | Wt 150.0 lb

## 2017-03-27 DIAGNOSIS — R42 Dizziness and giddiness: Secondary | ICD-10-CM

## 2017-03-27 DIAGNOSIS — G35 Multiple sclerosis: Secondary | ICD-10-CM

## 2017-03-27 DIAGNOSIS — H8109 Meniere's disease, unspecified ear: Secondary | ICD-10-CM | POA: Diagnosis not present

## 2017-03-27 DIAGNOSIS — G4700 Insomnia, unspecified: Secondary | ICD-10-CM

## 2017-03-27 DIAGNOSIS — F439 Reaction to severe stress, unspecified: Secondary | ICD-10-CM

## 2017-03-27 DIAGNOSIS — R5382 Chronic fatigue, unspecified: Secondary | ICD-10-CM | POA: Diagnosis not present

## 2017-03-27 DIAGNOSIS — R260 Ataxic gait: Secondary | ICD-10-CM | POA: Diagnosis not present

## 2017-03-27 MED ORDER — TRIAMTERENE-HCTZ 37.5-25 MG PO CAPS: 1.0000 | ORAL_CAPSULE | Freq: Every day | ORAL | 3 refills | Status: DC

## 2017-03-27 MED ORDER — FLUOXETINE HCL 20 MG PO CAPS: 20.0000 mg | ORAL_CAPSULE | Freq: Every day | ORAL | 4 refills | Status: DC

## 2017-03-27 MED ORDER — ZOLPIDEM TARTRATE 10 MG PO TABS: 10.0000 mg | ORAL_TABLET | Freq: Every day | ORAL | 1 refills | Status: DC

## 2017-03-27 MED ORDER — DIAZEPAM 5 MG PO TABS: ORAL_TABLET | ORAL | 0 refills | Status: DC

## 2017-03-27 MED ORDER — TERIFLUNOMIDE 14 MG PO TABS: 14.0000 mg | ORAL_TABLET | Freq: Every day | ORAL | 3 refills | Status: DC

## 2017-03-27 NOTE — Progress Notes (Signed)
[facility]  PATIENT: Leslie Ryan DOB: Mar 27, 1951  REFERRING CLINICIAN: Burnard Bunting HISTORY FROM: patient REASON FOR VISIT: MS   HISTORICAL  CHIEF COMPLAINT:  Chief Complaint  Patient presents with  . Follow-up    MS follow up    HISTORY OF PRESENT ILLNESS:  Leslie Ryan is a 66 year old woman with multiple sclerosis.   She feels her MS is mostly ok but family issues are adding stress.   Her husband has AD and she primarily cares for her granddaughter  MS:   She is on Aubagio since 10/19/12.   She is tolerating it well. She gets labwork several times a year with Dr. Reynaldo Minium (PCP)  She has not had any exacerbation.  Gait/strength/sensatoin:  Gait is stable.   It is mildly spastic and ataxic.   She stumbles some but no falls.   She has mild leg weakness and spasticity, slightly worse on her left.     She notes mild weakness in the legs. Diazepam has helped a little bit with this spasticity.  She is not exercising because she feels so weak afterwards.    Bladder/Bowel:  She denies any significant difficulty with her bladder. Bowel function is fine.   Vision:  Vision is doing ok.   No changes..   She has been diagnosed with macular degeneration  Fatigue/sleep:   She notes a lot of  fatigue, and doing chores especially tires her out.       Fatigue is more physical than mental.  She has insomnia that is helped by Ambien. However, her RLS seems to be doing worse and she can't get comfortable some nights (1-2 times a month).     Sleep maintenance is more of a problem than sleep onset.   Once asleep, she usually gets at least 5 hours sleep but wakes up a few times some nights  Mood/Cognition:    She has had depression And notes more stress the last year. (mother died Oct 20, 2015 ago and husband has AD).   She is on fluoxetine and feels it does help some..  She also occasionally takes diazepam for anxiety.   She denies major cognitive issues but notes mild memory  problems at times.    Meniere's:  She has had a couple episodes of Mnire's disease with severe vertigo and N/V.   The combination of the diazepam and Dyazide has greatly helped.      We dicussed taking an additional  valium on those rare days when she has mild vertigo and is concerned it will intensify.   .      MS History:  In the mid 1980s, she presented with right optic neuritis while working at [employer]. A lumbar puncture was inconclusive but an MRI of the brain was consistent with MS a few years later. In the 1980s and early 1990s, she received multiple rounds of IV steroids for occasional exacerbations involving vision or gait. In 10-19-92, she started Betaseron stopped quickly due to severe injection site reactions. In January 1997, after an exacerbation she was started on Avonex and continued on Avonex for the next 15 years or so. She had some issues with compliance on some occasions.   In the late 1990s, she began to have more permanent gait issues the right foot drop that slowly worsened. I saw early MRI reports from 10-19-98 showing 2 foci at C6 and C7 on the cervical spine and predominantly periventricular foci on brain MRI.   On 02/20/2013, she had  the sudden onset of vertigo that was not altered by movements. Her gait worsened with ataxia and she felt like she was being pushed to the right as she walked. She did not note any numbness or weakness or visual changes. An MRI of the brain on 02/25/2013 showed multiple periventricular white matter lesions consistent with MS. She received 3 days of IV steroids with benefit. I personally reviewed the MRI and felt that there were 3 new lesions in the left frontal parietal out of that were not present on her prior MRI dated 08/08/2010 the foci enhanced. Due to the exacerbation and the MRI changes, she switched to Aubagio in 2014. She tolerates it well and liver function tests have been fine.   REVIEW OF SYSTEMS:  Constitutional: No fevers, chills,  sweats, or change in appetite Eyes: No visual changes, double vision, eye pain Ear, nose and throat: No hearing loss, ear pain, nasal congestion, sore throat Cardiovascular: No chest pain, palpitations Respiratory:  No shortness of breath at rest or with exertion.   No wheezes GastrointestinaI: No nausea, vomiting, diarrhea, abdominal pain, fecal incontinence Genitourinary:  No dysuria, urinary retention or frequency.  No nocturia. Musculoskeletal:  No neck pain, back pain Integumentary: No rash, pruritus, skin lesions Neurological: as above Psychiatric: No depression at this time.  No anxiety Endocrine: No palpitations, diaphoresis, change in appetite, change in weigh or increased thirst.   She has felt cold since thyroid med's changed Hematologic/Lymphatic:  No anemia, purpura, petechiae. Allergic/Immunologic: No itchy/runny eyes, nasal congestion, recent allergic reactions, rashes  ALLERGIES: Allergies  Allergen Reactions  . Erythromycin Other (See Comments)    Abdominal cramping  . Morphine And Related     Patient described heaviness and loss of feeling in legs coupled with a squeezing feeling in the epigastric region. Requested no further Morphine doses.       HOME MEDICATIONS: Outpatient Medications Prior to Visit  Medication Sig Dispense Refill  . aspirin 81 MG tablet Take 81 mg by mouth daily.     . Cholecalciferol (VITAMIN D-3) 5000 UNITS TABS Take 1 tablet by mouth every other day.     . lansoprazole (PREVACID) 30 MG capsule Take 30 mg by mouth daily.     Marland Kitchen levothyroxine (SYNTHROID, LEVOTHROID) 112 MCG tablet Take 112 mcg by mouth daily.      . pravastatin (PRAVACHOL) 40 MG tablet Take 20 mg by mouth daily. Reported on 02/15/2016    . vitamin E 400 UNIT capsule Take 400 Units by mouth daily.      . diazepam (VALIUM) 5 MG tablet One or two po twice daily 180 tablet 1  . FLUoxetine (PROZAC) 20 MG capsule Take 20 mg by mouth daily.      . Teriflunomide (AUBAGIO) 14 MG TABS  Take 14 mg by mouth daily. 90 tablet 3  . triamterene-hydrochlorothiazide (DYAZIDE) 37.5-25 MG capsule Take 1 each (1 capsule total) by mouth daily. 90 capsule 3  . zolpidem (AMBIEN) 10 MG tablet Take 1 tablet (10 mg total) by mouth at bedtime. 90 tablet 1   No facility-administered medications prior to visit.     PAST MEDICAL HISTORY: Past Medical History:  Diagnosis Date  . Celiac disease   . Cervical dysplasia   . Endometriosis   . Hearing loss   . High cholesterol   . Hypothyroidism   . Multiple sclerosis ([place])   . Vision abnormalities     PAST SURGICAL HISTORY: Past Surgical History:  Procedure Laterality Date  . ABDOMINAL  HYSTERECTOMY  1999   TAH,BSO menorrhgia,,endometriosis  . ABDOMINAL HYSTERECTOMY    . CHOLECYSTECTOMY    . CHOLECYSTECTOMY    . COLPOSCOPY    . FINGER SURGERY    . GYNECOLOGIC CRYOSURGERY  1987  . OOPHORECTOMY    . ROTATOR CUFF REPAIR    . ROTATOR CUFF REPAIR Right     FAMILY HISTORY: Family History  Problem Relation Age of Onset  . Breast cancer Mother        age 37's  . Cancer Mother        UTERINE  . Colon cancer Mother   . Diabetes Father   . Hypertension Father   . Heart disease Father   . Cancer Maternal Grandfather        Stomach cancer  . Heart disease Paternal Grandfather     SOCIAL HISTORY:  Social History   Social History  . Marital status: Married    Spouse name: N/A  . Number of children: N/A  . Years of education: N/A   Occupational History  . Not on file.   Social History Main Topics  . Smoking status: Former Research scientist (life sciences)  . Smokeless tobacco: Never Used  . Alcohol use No  . Drug use: No  . Sexual activity: No   Other Topics Concern  . Not on file   Social History Narrative  . No narrative on file     PHYSICAL EXAM  Vitals:   03/27/17 1308  BP: 122/72  Pulse: 72  Weight: 150 lb (68 kg)    Body mass index is 26.57 kg/m.   General: The patient is well-developed and well-nourished and in no  acute distress  Neck:.  The neck is nontender with good ROM   Neurologic Exam  Mental status: The patient is alert and oriented x 3 at the time of the examination. The patient has apparent normal recent and remote memory, with an apparently normal attention span and concentration ability.   Speech is normal.  Cranial nerves: Extraocular movements are full.  Facial strength and sensation is normal. Trapezius strength is normal.. No dysarthria is noted.  The tongue is midline, and the patient has symmetric elevation of the soft palate. No obvious hearing deficits are noted.  Motor:  Muscle bulk is normal but tone is increased. Strength is  5 / 5 in all 4 extremities.   Sensory: She has intact touch and vibration sensation in the arms and legs.  Coordination: Cerebellar testing reveals good finger-nose-finger and heel-to-shin bilaterally.  Gait and station: Station and gait are normal. Tandem gait is wide. Romberg is negative.   Reflexes: Deep tendon reflexes are symmetric and normal bilaterally.      DIAGNOSTIC DATA (LABS, IMAGING, TESTING) - I reviewed patient records, labs, notes, testing and imaging myself where available.  Lab Results  Component Value Date   WBC 3.9 09/19/2016   HGB 13.4 09/19/2016   HCT 39.4 09/19/2016   MCV 83 09/19/2016   PLT 219 09/19/2016      Component Value Date/Time   NA 135 09/19/2016 1358   K 3.5 09/19/2016 1358   CL 90 (L) 09/19/2016 1358   CO2 25 09/19/2016 1358   GLUCOSE 108 (H) 09/19/2016 1358   GLUCOSE 124 (H) 06/21/2014 0452   BUN 15 09/19/2016 1358   CREATININE 0.90 09/19/2016 1358   CALCIUM 9.5 09/19/2016 1358   PROT 6.8 09/19/2016 1358   ALBUMIN 4.2 09/19/2016 1358   AST 17 09/19/2016 1358   ALT 20 09/19/2016  3142   JARWPTY 03 09/19/2016 1358   BILITOT 0.4 09/19/2016 1358   GFRNONAA 67 09/19/2016 1358   GFRAA 77 09/19/2016 1358   No results found for: CHOL, HDL, LDLCALC, LDLDIRECT, TRIG, CHOLHDL No results found for:  HGBA1C No results found for: VITAMINB12 Lab Results  Component Value Date   TSH 0.042 (L) 06/21/2014        ASSESSMENT AND PLAN  Multiple sclerosis (HCC)  Vertigo  Meniere's disease, unspecified laterality  Ataxic gait  Chronic fatigue  Stress at home  Insomnia, unspecified type    1.  Continue Aubagio 40 mg daily. She gets blood work to couple times a year with her primary care doctor.  She is very claustrophobic and prefers not to do an MRI at this time. 2.  Continue dyazide and prn valium for her Mnire's disease.   If she does get a severe episode, she can take up to 4 Valiums that day.. 3.  Ambien for sleep. 4.  Advised to stay active and exercises as tolerated 5.   She will return to see me in about 6  months, sooner if she has new or worsening neurologic symptoms.    Richard A. Felecia Shelling, MD, PhD 11/27/6114, 4:35 PM Certified in Neurology, Clinical Neurophysiology, Sleep Medicine, Pain Medicine and Neuroimaging  [facility] [address] [place], [place] [address] [phone] 2

## 2017-03-27 NOTE — Telephone Encounter (Signed)
Ambien and Valium Rx's successfully faxed to [facility].

## 2017-03-28 ENCOUNTER — Telehealth: Payer: Self-pay | Admitting: *Deleted

## 2017-03-28 NOTE — Telephone Encounter (Signed)
It is normal.

## 2017-03-28 NOTE — Telephone Encounter (Signed)
Pt had bone density faxed from [facility] would like to know results. Result placed on your desk.  Please advise

## 2017-03-28 NOTE — Telephone Encounter (Signed)
Pt informed

## 2017-04-08 ENCOUNTER — Encounter: Payer: Self-pay | Admitting: Women's Health

## 2017-04-10 ENCOUNTER — Encounter: Payer: Self-pay | Admitting: Women's Health

## 2017-06-08 DIAGNOSIS — Z23 Encounter for immunization: Secondary | ICD-10-CM | POA: Diagnosis not present

## 2017-09-30 ENCOUNTER — Ambulatory Visit: Payer: Medicare Other | Admitting: Neurology

## 2017-10-01 ENCOUNTER — Encounter: Payer: Self-pay | Admitting: Neurology

## 2017-10-11 DIAGNOSIS — M545 Low back pain: Secondary | ICD-10-CM | POA: Diagnosis not present

## 2017-10-14 ENCOUNTER — Telehealth: Payer: Self-pay | Admitting: Neurology

## 2017-10-14 MED ORDER — TRIAMTERENE-HCTZ 37.5-25 MG PO CAPS: 1.0000 | ORAL_CAPSULE | Freq: Every day | ORAL | 3 refills | Status: DC

## 2017-10-14 MED ORDER — DIAZEPAM 5 MG PO TABS: ORAL_TABLET | ORAL | 0 refills | Status: DC

## 2017-10-14 NOTE — Telephone Encounter (Signed)
Dyazide escribed to [facility], Diazepam faxed to [place] [facility].  Vit. E is otc, does not require rx/fim

## 2017-10-14 NOTE — Telephone Encounter (Signed)
Patient requesting refill of   triamterene-hydrochlorothiazide (DYAZIDE) 37.5-25 MG capsule    vitamin E 400 UNIT capsule and diazepam (VALIUM) 5 MG tablet called to  [facility]; [facility] mail order pharmacy.

## 2017-10-14 NOTE — Addendum Note (Signed)
Addended by: France Ravens I on: 10/14/2017 03:28 PM   Modules accepted: Orders

## 2017-10-24 ENCOUNTER — Telehealth: Payer: Self-pay | Admitting: Neurology

## 2017-10-24 MED ORDER — DIAZEPAM 5 MG PO TABS: ORAL_TABLET | ORAL | 0 refills | Status: DC

## 2017-10-24 MED ORDER — ZOLPIDEM TARTRATE 10 MG PO TABS: 10.0000 mg | ORAL_TABLET | Freq: Every day | ORAL | 1 refills | Status: DC

## 2017-10-24 NOTE — Addendum Note (Signed)
Addended by: France Ravens I on: 10/24/2017 04:22 PM   Modules accepted: Orders

## 2017-10-24 NOTE — Telephone Encounter (Signed)
Ambien rx. faxed to [facility]

## 2017-10-24 NOTE — Telephone Encounter (Signed)
Per [facility], ok for 2 wk. supply of Diazepam.  Rx. faxed to [facility]im

## 2017-10-24 NOTE — Addendum Note (Signed)
Addended by: France Ravens I on: 10/24/2017 04:49 PM   Modules accepted: Orders

## 2017-10-24 NOTE — Telephone Encounter (Signed)
Pt is asking if Dr Felecia Shelling will please call in her refill of  zolpidem (AMBIEN) 10 MG tablet to  [place], [place] [phone] (Phone) [phone] (Fax)   And if a 2 week prescription of diazepam (VALIUM) 5 MG tablet can be called into  [facility] [place], [address] [address] [place] [phone] (Phone) [phone] (Fax)   Along with a 1 week  week prescription of triamterene-hydrochlorothiazide (DYAZIDE) 37.5-25 MG capsule Please call that into  [place], [place] AT [place] [phone] (Phone) [phone] (Fax)   While she waits on her full prescriptions to come mail order.  No call back requested

## 2017-10-25 MED ORDER — TRIAMTERENE-HCTZ 37.5-25 MG PO CAPS: 1.0000 | ORAL_CAPSULE | Freq: Every day | ORAL | 0 refills | Status: DC

## 2017-10-25 NOTE — Addendum Note (Signed)
Addended by: France Ravens I on: 10/25/2017 10:51 AM   Modules accepted: Orders

## 2017-10-25 NOTE — Telephone Encounter (Signed)
Spoke with Leslie Ryan.  Ambien has been faxed to the [place]. Dyazide and Diazepam have been sent to [facility].Leslie Ryan

## 2017-10-25 NOTE — Telephone Encounter (Signed)
Pt called stating that the 2 week supply for Valium was suppose to go to [place] and a week supply for Dyazide as well not to [facility] stating it wont be here for 2 weeks. Pt requesting a call back to discuss

## 2017-11-15 ENCOUNTER — Telehealth: Payer: Self-pay | Admitting: Neurology

## 2017-11-15 ENCOUNTER — Other Ambulatory Visit: Payer: Self-pay

## 2017-11-15 ENCOUNTER — Ambulatory Visit (INDEPENDENT_AMBULATORY_CARE_PROVIDER_SITE_OTHER): Payer: Medicare Other | Admitting: Neurology

## 2017-11-15 ENCOUNTER — Encounter: Payer: Self-pay | Admitting: Neurology

## 2017-11-15 VITALS — BP 137/88 | HR 81 | Resp 18 | Ht 63.0 in | Wt 152.5 lb

## 2017-11-15 DIAGNOSIS — R260 Ataxic gait: Secondary | ICD-10-CM

## 2017-11-15 DIAGNOSIS — F439 Reaction to severe stress, unspecified: Secondary | ICD-10-CM | POA: Diagnosis not present

## 2017-11-15 DIAGNOSIS — F329 Major depressive disorder, single episode, unspecified: Secondary | ICD-10-CM | POA: Diagnosis not present

## 2017-11-15 DIAGNOSIS — R5382 Chronic fatigue, unspecified: Secondary | ICD-10-CM

## 2017-11-15 DIAGNOSIS — H8109 Meniere's disease, unspecified ear: Secondary | ICD-10-CM | POA: Diagnosis not present

## 2017-11-15 DIAGNOSIS — G35 Multiple sclerosis: Secondary | ICD-10-CM

## 2017-11-15 DIAGNOSIS — F32A Depression, unspecified: Secondary | ICD-10-CM | POA: Insufficient documentation

## 2017-11-15 MED ORDER — ZOLPIDEM TARTRATE 10 MG PO TABS: 10.0000 mg | ORAL_TABLET | Freq: Every day | ORAL | 1 refills | Status: DC

## 2017-11-15 MED ORDER — FLUOXETINE HCL 20 MG PO CAPS
20.0000 mg | ORAL_CAPSULE | Freq: Every day | ORAL | 4 refills | Status: DC
Start: 2017-11-15 — End: 2018-12-30

## 2017-11-15 MED ORDER — TRIAMTERENE-HCTZ 37.5-25 MG PO CAPS: 1.0000 | ORAL_CAPSULE | Freq: Every day | ORAL | 1 refills | Status: DC

## 2017-11-15 MED ORDER — DIAZEPAM 5 MG PO TABS: ORAL_TABLET | ORAL | 5 refills | Status: DC

## 2017-11-15 NOTE — Telephone Encounter (Signed)
Pt was seen on 3/29- at check out she stated she was in a hurry and requested we call her to schedule her 6 month follow up.

## 2017-11-15 NOTE — Progress Notes (Signed)
[facility]  PATIENT: Leslie Ryan DOB: 24-Apr-1951  REFERRING CLINICIAN: Burnard Bunting HISTORY FROM: patient REASON FOR VISIT: MS   HISTORICAL  CHIEF COMPLAINT:  Chief Complaint  Patient presents with  . Multiple Sclerosis    Sts. she contines to tolerate Aubagio well.  Feels hearing loss is worse bilat./fim    HISTORY OF PRESENT ILLNESS:  Leslie Ryan is a 67 year old woman with multiple sclerosis.     Update 11/15/2017: She feels her MS is stable but she is under a lot of stress with her husband having moderate AD. Mood is poor with dperession and irritability.         She wobbles and stumbles some with gait but falls are very rare.    Her gait issues have progressed gradually.   She feels the leg spasticity is helped by valium.  She feels fatigue is worse.   She is sleeping worse due to her husband's AD issues (he wanders and sometimes wakes up gets dressed and is ready for the day.  He has sundowning and Seroquel has not helped him much).     She has Meniere's and takes Dyazide if she gets any vertigo      From 03/27/2017: She feels her MS is mostly ok but family issues are adding stress.   Her husband has AD and she primarily cares for her granddaughter  MS:   She is on Aubagio since October 15, 2012.   She is tolerating it well. She gets labwork several times a year with Dr. Reynaldo Minium (PCP)  She has not had any exacerbation.  Gait/strength/sensatoin:  Gait is stable.   It is mildly spastic and ataxic.   She stumbles some but no falls.   She has mild leg weakness and spasticity, slightly worse on her left.     She notes mild weakness in the legs. Diazepam has helped a little bit with this spasticity.  She is not exercising because she feels so weak afterwards.    Bladder/Bowel:  She denies any significant difficulty with her bladder. Bowel function is fine.   Vision:  Vision is doing ok.   No changes..   She has been diagnosed with macular  degeneration  Fatigue/sleep:   She notes a lot of  fatigue, and doing chores especially tires her out.       Fatigue is more physical than mental.  She has insomnia that is helped by Ambien. However, her RLS seems to be doing worse and she can't get comfortable some nights (1-2 times a month).     Sleep maintenance is more of a problem than sleep onset.   Once asleep, she usually gets at least 5 hours sleep but wakes up a few times some nights  Mood/Cognition:    She has had depression And notes more stress the last year. (mother died October 16, 2015 ago and husband has AD).   She is on fluoxetine and feels it does help some..  She also occasionally takes diazepam for anxiety.   She denies major cognitive issues but notes mild memory problems at times.    Meniere's:  She has had a couple episodes of Mnire's disease with severe vertigo and N/V.   The combination of the diazepam and Dyazide has greatly helped.      We dicussed taking an additional  valium on those rare days when she has mild vertigo and is concerned it will intensify.   .      MS History:  In the  mid 1980s, she presented with right optic neuritis while working at [facility]. A lumbar puncture was inconclusive but an MRI of the brain was consistent with MS a few years later. In the 1980s and early 1990s, she received multiple rounds of IV steroids for occasional exacerbations involving vision or gait. In 1994, she started Betaseron stopped quickly due to severe injection site reactions. In January 1997, after an exacerbation she was started on Avonex and continued on Avonex for the next 15 years or so. She had some issues with compliance on some occasions.   In the late 1990s, she began to have more permanent gait issues the right foot drop that slowly worsened. I saw early MRI reports from 2000 showing 2 foci at C6 and C7 on the cervical spine and predominantly periventricular foci on brain MRI.   On 02/20/2013, she had the sudden onset of  vertigo that was not altered by movements. Her gait worsened with ataxia and she felt like she was being pushed to the right as she walked. She did not note any numbness or weakness or visual changes. An MRI of the brain on 02/25/2013 showed multiple periventricular white matter lesions consistent with MS. She received 3 days of IV steroids with benefit. I personally reviewed the MRI and felt that there were 3 new lesions in the left frontal parietal out of that were not present on her prior MRI dated 08/08/2010 the foci enhanced. Due to the exacerbation and the MRI changes, she switched to Aubagio in 2014. She tolerates it well and liver function tests have been fine.   REVIEW OF SYSTEMS:  Constitutional: No fevers, chills, sweats, or change in appetite Eyes: No visual changes, double vision, eye pain Ear, nose and throat: No hearing loss, ear pain, nasal congestion, sore throat Cardiovascular: No chest pain, palpitations Respiratory:  No shortness of breath at rest or with exertion.   No wheezes GastrointestinaI: No nausea, vomiting, diarrhea, abdominal pain, fecal incontinence Genitourinary:  No dysuria, urinary retention or frequency.  No nocturia. Musculoskeletal:  No neck pain, back pain Integumentary: No rash, pruritus, skin lesions Neurological: as above Psychiatric: No depression at this time.  No anxiety Endocrine: No palpitations, diaphoresis, change in appetite, change in weigh or increased thirst.   She has felt cold since thyroid med's changed Hematologic/Lymphatic:  No anemia, purpura, petechiae. Allergic/Immunologic: No itchy/runny eyes, nasal congestion, recent allergic reactions, rashes  ALLERGIES: Allergies  Allergen Reactions  . Erythromycin Other (See Comments)    Abdominal cramping  . Morphine And Related     Patient described heaviness and loss of feeling in legs coupled with a squeezing feeling in the epigastric region. Requested no further Morphine doses.        HOME MEDICATIONS: Outpatient Medications Prior to Visit  Medication Sig Dispense Refill  . aspirin 81 MG tablet Take 81 mg by mouth daily.     . Cholecalciferol (VITAMIN D-3) 5000 UNITS TABS Take 1 tablet by mouth every other day.     . lansoprazole (PREVACID) 30 MG capsule Take 30 mg by mouth daily.     Marland Kitchen levothyroxine (SYNTHROID, LEVOTHROID) 112 MCG tablet Take 112 mcg by mouth daily.      . pravastatin (PRAVACHOL) 40 MG tablet Take 20 mg by mouth daily. Reported on 02/15/2016    . Teriflunomide (AUBAGIO) 14 MG TABS Take 14 mg by mouth daily. 90 tablet 3  . vitamin E 400 UNIT capsule Take 400 Units by mouth daily.      Marland Kitchen  diazepam (VALIUM) 5 MG tablet One or two po twice daily 60 tablet 0  . FLUoxetine (PROZAC) 20 MG capsule Take 1 capsule (20 mg total) by mouth daily. 90 capsule 4  . triamterene-hydrochlorothiazide (DYAZIDE) 37.5-25 MG capsule Take 1 each (1 capsule total) by mouth daily. 7 capsule 0  . zolpidem (AMBIEN) 10 MG tablet Take 1 tablet (10 mg total) by mouth at bedtime. 90 tablet 1   No facility-administered medications prior to visit.     PAST MEDICAL HISTORY: Past Medical History:  Diagnosis Date  . Celiac disease   . Cervical dysplasia   . Endometriosis   . Hearing loss   . High cholesterol   . Hypothyroidism   . Multiple sclerosis ([place])   . Vision abnormalities     PAST SURGICAL HISTORY: Past Surgical History:  Procedure Laterality Date  . ABDOMINAL HYSTERECTOMY  1999   TAH,BSO menorrhgia,,endometriosis  . ABDOMINAL HYSTERECTOMY    . CHOLECYSTECTOMY    . CHOLECYSTECTOMY    . COLPOSCOPY    . FINGER SURGERY    . GYNECOLOGIC CRYOSURGERY  1987  . OOPHORECTOMY    . ROTATOR CUFF REPAIR    . ROTATOR CUFF REPAIR Right     FAMILY HISTORY: Family History  Problem Relation Age of Onset  . Breast cancer Mother        age 19's  . Cancer Mother        UTERINE  . Colon cancer Mother   . Diabetes Father   . Hypertension Father   . Heart disease Father    . Cancer Maternal Grandfather        Stomach cancer  . Heart disease Paternal Grandfather     SOCIAL HISTORY:  Social History   Socioeconomic History  . Marital status: Married    Spouse name: Not on file  . Number of children: Not on file  . Years of education: Not on file  . Highest education level: Not on file  Occupational History  . Not on file  Social Needs  . Financial resource strain: Not on file  . Food insecurity:    Worry: Not on file    Inability: Not on file  . Transportation needs:    Medical: Not on file    Non-medical: Not on file  Tobacco Use  . Smoking status: Former Research scientist (life sciences)  . Smokeless tobacco: Never Used  Substance and Sexual Activity  . Alcohol use: No  . Drug use: No  . Sexual activity: Never    Birth control/protection: Surgical  Lifestyle  . Physical activity:    Days per week: Not on file    Minutes per session: Not on file  . Stress: Not on file  Relationships  . Social connections:    Talks on phone: Not on file    Gets together: Not on file    Attends religious service: Not on file    Active member of club or organization: Not on file    Attends meetings of clubs or organizations: Not on file    Relationship status: Not on file  . Intimate partner violence:    Fear of current or ex partner: Not on file    Emotionally abused: Not on file    Physically abused: Not on file    Forced sexual activity: Not on file  Other Topics Concern  . Not on file  Social History Narrative  . Not on file     PHYSICAL EXAM  Vitals:   11/15/17  1129  BP: 137/88  Pulse: 81  Resp: 18  Weight: 152 lb 8 oz (69.2 kg)  Height: 5' 3"  (1.6 m)    Body mass index is 27.01 kg/m.   General: The patient is well-developed and well-nourished and in no acute distress  Neck:.  The neck is nontender with good ROM   Neurologic Exam  Mental status: The patient is alert and oriented x 3 at the time of the examination. The patient has apparent normal  recent and remote memory, with an apparently normal attention span and concentration ability.   Speech is normal.  Cranial nerves: Extraocular movements are full.  Facial strength and sensation is normal. Trapezius strength is normal.. No dysarthria is noted.  The tongue is midline, and the patient has symmetric elevation of the soft palate. No obvious hearing deficits are noted.  Motor: Muscle bulk is normal.  Muscle tone is increased.  Strength is 5/5.  Sensory: She has intact touch and vibration sensation in the arms and legs.  Coordination: Cerebellar testing reveals good finger-nose-finger and heel-to-shin bilaterally.  Gait and station: The station is normal.  Gait is fairly normal though the tandem gait is wide.  Reflexes: Deep tendon reflexes are symmetric and normal bilaterally.      DIAGNOSTIC DATA (LABS, IMAGING, TESTING) - I reviewed patient records, labs, notes, testing and imaging myself where available.  Lab Results  Component Value Date   WBC 3.9 09/19/2016   HGB 13.4 09/19/2016   HCT 39.4 09/19/2016   MCV 83 09/19/2016   PLT 219 09/19/2016      Component Value Date/Time   NA 135 09/19/2016 1358   K 3.5 09/19/2016 1358   CL 90 (L) 09/19/2016 1358   CO2 25 09/19/2016 1358   GLUCOSE 108 (H) 09/19/2016 1358   GLUCOSE 124 (H) 06/21/2014 0452   BUN 15 09/19/2016 1358   CREATININE 0.90 09/19/2016 1358   CALCIUM 9.5 09/19/2016 1358   PROT 6.8 09/19/2016 1358   ALBUMIN 4.2 09/19/2016 1358   AST 17 09/19/2016 1358   ALT 20 09/19/2016 1358   ALKPHOS 67 09/19/2016 1358   BILITOT 0.4 09/19/2016 1358   GFRNONAA 67 09/19/2016 1358   GFRAA 77 09/19/2016 1358        ASSESSMENT AND PLAN  Multiple sclerosis (HCC)  Meniere's disease, unspecified laterality  Chronic fatigue  Ataxic gait  Stress at home  Depression, unspecified depression type    1.  Continue Aubagio 14 mg daily.  We discussed getting an MRI.  She is very claustrophobic and prefers not  to do.  As her MS has not significantly progressed I will hold off at this time. 2.  Continue dyazide and prn valium for her Mnire's disease.   If she does get a severe episode, she can take up to 4 Valiums that day.. 3.  Ambien for sleep.   Prozac for depression/anxiety.   She has a lot of stress due to her husband's illness.  If this worsens, consider counseling. 4.  Advised to stay active and exercises as tolerated 5.   She will return to see me in about 6  months, sooner if she has new or worsening neurologic symptoms.    Mechell Girgis A. Felecia Shelling, MD, PhD 11/06/2332, 3:56 PM Certified in Neurology, Clinical Neurophysiology, Sleep Medicine, Pain Medicine and Neuroimaging  [facility] [address], [place], [place] [address] [phone] 2

## 2017-11-29 ENCOUNTER — Encounter (INDEPENDENT_AMBULATORY_CARE_PROVIDER_SITE_OTHER): Payer: Medicare Other | Admitting: Ophthalmology

## 2017-12-02 DIAGNOSIS — H6122 Impacted cerumen, left ear: Secondary | ICD-10-CM | POA: Diagnosis not present

## 2017-12-02 DIAGNOSIS — R42 Dizziness and giddiness: Secondary | ICD-10-CM | POA: Diagnosis not present

## 2017-12-25 ENCOUNTER — Encounter (INDEPENDENT_AMBULATORY_CARE_PROVIDER_SITE_OTHER): Payer: Medicare Other | Admitting: Ophthalmology

## 2018-01-16 DIAGNOSIS — E049 Nontoxic goiter, unspecified: Secondary | ICD-10-CM | POA: Diagnosis not present

## 2018-01-16 DIAGNOSIS — Z79899 Other long term (current) drug therapy: Secondary | ICD-10-CM | POA: Diagnosis not present

## 2018-01-16 DIAGNOSIS — E7849 Other hyperlipidemia: Secondary | ICD-10-CM | POA: Diagnosis not present

## 2018-01-16 DIAGNOSIS — R82998 Other abnormal findings in urine: Secondary | ICD-10-CM | POA: Diagnosis not present

## 2018-01-20 ENCOUNTER — Ambulatory Visit: Payer: Medicare Other | Admitting: Psychology

## 2018-01-20 DIAGNOSIS — F419 Anxiety disorder, unspecified: Secondary | ICD-10-CM | POA: Diagnosis not present

## 2018-01-20 DIAGNOSIS — H8110 Benign paroxysmal vertigo, unspecified ear: Secondary | ICD-10-CM | POA: Diagnosis not present

## 2018-01-20 DIAGNOSIS — Z6828 Body mass index (BMI) 28.0-28.9, adult: Secondary | ICD-10-CM | POA: Diagnosis not present

## 2018-01-20 DIAGNOSIS — Z1389 Encounter for screening for other disorder: Secondary | ICD-10-CM | POA: Diagnosis not present

## 2018-01-20 DIAGNOSIS — K219 Gastro-esophageal reflux disease without esophagitis: Secondary | ICD-10-CM | POA: Diagnosis not present

## 2018-01-20 DIAGNOSIS — E038 Other specified hypothyroidism: Secondary | ICD-10-CM | POA: Diagnosis not present

## 2018-01-20 DIAGNOSIS — E7849 Other hyperlipidemia: Secondary | ICD-10-CM | POA: Diagnosis not present

## 2018-01-20 DIAGNOSIS — E049 Nontoxic goiter, unspecified: Secondary | ICD-10-CM | POA: Diagnosis not present

## 2018-01-20 DIAGNOSIS — Z0000 Encounter for general adult medical examination without abnormal findings: Secondary | ICD-10-CM | POA: Diagnosis not present

## 2018-01-20 DIAGNOSIS — G35 Multiple sclerosis: Secondary | ICD-10-CM | POA: Diagnosis not present

## 2018-03-20 ENCOUNTER — Encounter: Payer: Self-pay | Admitting: Women's Health

## 2018-03-20 DIAGNOSIS — Z803 Family history of malignant neoplasm of breast: Secondary | ICD-10-CM | POA: Diagnosis not present

## 2018-03-20 DIAGNOSIS — Z1231 Encounter for screening mammogram for malignant neoplasm of breast: Secondary | ICD-10-CM | POA: Diagnosis not present

## 2018-03-28 ENCOUNTER — Telehealth: Payer: Self-pay | Admitting: Neurology

## 2018-03-28 NOTE — Telephone Encounter (Signed)
Pt request refills for zolpidem (AMBIEN) 10 MG tablet, diazepam (VALIUM) 5 MG tablet, triamterene-hydrochlorothiazide (DYAZIDE) 37.5-25 MG capsule and Teriflunomide (AUBAGIO) 14 MG TABS 90 day supply sent to [facility].

## 2018-04-01 ENCOUNTER — Telehealth: Payer: Self-pay | Admitting: *Deleted

## 2018-04-01 ENCOUNTER — Encounter: Payer: Medicare Other | Admitting: Women's Health

## 2018-04-01 NOTE — Telephone Encounter (Signed)
Patient called and asked if she needs to come for annual exam every year. Per note on 03/06/18, Leslie Ryan said 2 year recall. Patient aware.

## 2018-04-02 ENCOUNTER — Encounter (INDEPENDENT_AMBULATORY_CARE_PROVIDER_SITE_OTHER): Payer: Medicare Other | Admitting: Ophthalmology

## 2018-04-02 DIAGNOSIS — H35341 Macular cyst, hole, or pseudohole, right eye: Secondary | ICD-10-CM | POA: Diagnosis not present

## 2018-04-02 DIAGNOSIS — H35372 Puckering of macula, left eye: Secondary | ICD-10-CM | POA: Diagnosis not present

## 2018-04-02 DIAGNOSIS — H43813 Vitreous degeneration, bilateral: Secondary | ICD-10-CM

## 2018-04-02 DIAGNOSIS — H2513 Age-related nuclear cataract, bilateral: Secondary | ICD-10-CM | POA: Diagnosis not present

## 2018-04-07 ENCOUNTER — Telehealth: Payer: Self-pay | Admitting: Neurology

## 2018-04-07 DIAGNOSIS — H35341 Macular cyst, hole, or pseudohole, right eye: Secondary | ICD-10-CM | POA: Diagnosis present

## 2018-04-07 NOTE — Telephone Encounter (Signed)
Spoke with Leslie Ryan and advised that per RAS, from a neurologic standpoint, it is fine for her to have anesthesia for eye surgery.  She verbalized understanding of same/fim

## 2018-04-07 NOTE — Telephone Encounter (Signed)
Patient is having surgery on 05-06-18  because of a hole in her macular. Is this safe for her to have and be put to sleep? Please call and discuss.

## 2018-04-07 NOTE — H&P (Signed)
Leslie Ryan is an 67 y.o. female.   Chief Complaint: loss of vision right eye HPI: noted loss of vision one year ago in the right eye  Past Medical History:  Diagnosis Date  . Celiac disease   . Cervical dysplasia   . Endometriosis   . Hearing loss   . High cholesterol   . Hypothyroidism   . Multiple sclerosis ([place])   . Vision abnormalities     Past Surgical History:  Procedure Laterality Date  . ABDOMINAL HYSTERECTOMY  1999   TAH,BSO menorrhgia,,endometriosis  . ABDOMINAL HYSTERECTOMY    . CHOLECYSTECTOMY    . CHOLECYSTECTOMY    . COLPOSCOPY    . FINGER SURGERY    . GYNECOLOGIC CRYOSURGERY  1987  . OOPHORECTOMY    . ROTATOR CUFF REPAIR    . ROTATOR CUFF REPAIR Right     Family History  Problem Relation Age of Onset  . Breast cancer Mother        age 65's  . Cancer Mother        UTERINE  . Colon cancer Mother   . Diabetes Father   . Hypertension Father   . Heart disease Father   . Cancer Maternal Grandfather        Stomach cancer  . Heart disease Paternal Grandfather    Social History:  reports that she has quit smoking. She has never used smokeless tobacco. She reports that she does not drink alcohol or use drugs.  Allergies:  Allergies  Allergen Reactions  . Erythromycin Other (See Comments)    Abdominal cramping  . Morphine And Related     Patient described heaviness and loss of feeling in legs coupled with a squeezing feeling in the epigastric region. Requested no further Morphine doses.       No medications prior to admission.    Review of systems otherwise negative  Last menstrual period 08/20/1997.  Physical exam: Mental status: oriented x3. Eyes: See eye exam associated with this date of surgery in media tab.  Scanned in by scanning center Ears, Nose, Throat: within normal limits Neck: Within Normal limits General: within normal limits Chest: Within normal limits Breast: deferred Heart: Within normal limits Abdomen: Within  normal limits GU: deferred Extremities: within normal limits Skin: within normal limits  Assessment/Plan Macular hole, right eye Plan: To [facility] for Pars plana vitrectomy, internal limiting membrane peel, laser, serum patch, laser right eye.  Hayden Pedro 04/07/2018, 11:01 AM

## 2018-04-11 ENCOUNTER — Other Ambulatory Visit: Payer: Self-pay | Admitting: *Deleted

## 2018-04-11 MED ORDER — TERIFLUNOMIDE 14 MG PO TABS: 14.0000 mg | ORAL_TABLET | Freq: Every day | ORAL | 3 refills | Status: DC

## 2018-04-11 NOTE — Telephone Encounter (Signed)
Spoke to patient - states [facility] did not receive her Aubagio prescription and she needs it send it urgently.

## 2018-04-11 NOTE — Telephone Encounter (Addendum)
Pt has called with great concern, she states she has not received any of her medications they were requested on 08-09.  Pt states she is completely out of her Teriflunomide (AUBAGIO) 14 MG TABS and she can not afford out right the cost of these medications.  Pt provided an electronic # for [facility] [phone] Pt is asking for a call back as soon as possible

## 2018-04-11 NOTE — Telephone Encounter (Signed)
New rx sent into [facility].  I followed the electronic prescription with a phone call.  I spoke to [place] [employer] service) who verified the prescription has been received and she will mark it urgent.

## 2018-05-05 ENCOUNTER — Encounter (HOSPITAL_COMMUNITY): Payer: Self-pay | Admitting: *Deleted

## 2018-05-05 ENCOUNTER — Other Ambulatory Visit: Payer: Self-pay

## 2018-05-06 ENCOUNTER — Encounter (HOSPITAL_COMMUNITY): Payer: Self-pay | Admitting: *Deleted

## 2018-05-06 ENCOUNTER — Ambulatory Visit (HOSPITAL_COMMUNITY): Payer: Medicare Other | Admitting: Anesthesiology

## 2018-05-06 ENCOUNTER — Encounter (INDEPENDENT_AMBULATORY_CARE_PROVIDER_SITE_OTHER): Payer: Medicare Other | Admitting: Ophthalmology

## 2018-05-06 ENCOUNTER — Ambulatory Visit (HOSPITAL_COMMUNITY)
Admission: RE | Admit: 2018-05-06 | Discharge: 2018-05-07 | Disposition: A | Discharge status: Home / Self Care | Payer: Medicare Other | Source: Ambulatory Visit | Attending: Ophthalmology | Admitting: Ophthalmology

## 2018-05-06 ENCOUNTER — Encounter (HOSPITAL_COMMUNITY)
Admission: RE | Disposition: A | Discharge status: Home / Self Care | Payer: Self-pay | Source: Ambulatory Visit | Attending: Ophthalmology

## 2018-05-06 DIAGNOSIS — K900 Celiac disease: Secondary | ICD-10-CM | POA: Diagnosis not present

## 2018-05-06 DIAGNOSIS — F419 Anxiety disorder, unspecified: Secondary | ICD-10-CM | POA: Insufficient documentation

## 2018-05-06 DIAGNOSIS — H35341 Macular cyst, hole, or pseudohole, right eye: Secondary | ICD-10-CM | POA: Insufficient documentation

## 2018-05-06 DIAGNOSIS — H43813 Vitreous degeneration, bilateral: Secondary | ICD-10-CM

## 2018-05-06 DIAGNOSIS — E669 Obesity, unspecified: Secondary | ICD-10-CM | POA: Diagnosis not present

## 2018-05-06 DIAGNOSIS — Z87891 Personal history of nicotine dependence: Secondary | ICD-10-CM | POA: Diagnosis not present

## 2018-05-06 DIAGNOSIS — Z23 Encounter for immunization: Secondary | ICD-10-CM | POA: Diagnosis not present

## 2018-05-06 DIAGNOSIS — Z8049 Family history of malignant neoplasm of other genital organs: Secondary | ICD-10-CM | POA: Insufficient documentation

## 2018-05-06 DIAGNOSIS — Z881 Allergy status to other antibiotic agents status: Secondary | ICD-10-CM | POA: Diagnosis not present

## 2018-05-06 DIAGNOSIS — Z8249 Family history of ischemic heart disease and other diseases of the circulatory system: Secondary | ICD-10-CM | POA: Diagnosis not present

## 2018-05-06 DIAGNOSIS — H2513 Age-related nuclear cataract, bilateral: Secondary | ICD-10-CM | POA: Diagnosis not present

## 2018-05-06 DIAGNOSIS — Z6830 Body mass index (BMI) 30.0-30.9, adult: Secondary | ICD-10-CM | POA: Diagnosis not present

## 2018-05-06 DIAGNOSIS — Z90710 Acquired absence of both cervix and uterus: Secondary | ICD-10-CM | POA: Insufficient documentation

## 2018-05-06 DIAGNOSIS — E11319 Type 2 diabetes mellitus with unspecified diabetic retinopathy without macular edema: Secondary | ICD-10-CM | POA: Diagnosis not present

## 2018-05-06 DIAGNOSIS — Z9049 Acquired absence of other specified parts of digestive tract: Secondary | ICD-10-CM | POA: Diagnosis not present

## 2018-05-06 DIAGNOSIS — G35 Multiple sclerosis: Secondary | ICD-10-CM | POA: Diagnosis not present

## 2018-05-06 DIAGNOSIS — Z803 Family history of malignant neoplasm of breast: Secondary | ICD-10-CM | POA: Diagnosis not present

## 2018-05-06 DIAGNOSIS — Z800 Family history of malignant neoplasm of digestive organs: Secondary | ICD-10-CM | POA: Insufficient documentation

## 2018-05-06 DIAGNOSIS — E039 Hypothyroidism, unspecified: Secondary | ICD-10-CM | POA: Insufficient documentation

## 2018-05-06 DIAGNOSIS — Z833 Family history of diabetes mellitus: Secondary | ICD-10-CM | POA: Insufficient documentation

## 2018-05-06 DIAGNOSIS — E7800 Pure hypercholesterolemia, unspecified: Secondary | ICD-10-CM | POA: Diagnosis not present

## 2018-05-06 DIAGNOSIS — Z885 Allergy status to narcotic agent status: Secondary | ICD-10-CM | POA: Insufficient documentation

## 2018-05-06 DIAGNOSIS — H9190 Unspecified hearing loss, unspecified ear: Secondary | ICD-10-CM | POA: Insufficient documentation

## 2018-05-06 DIAGNOSIS — F329 Major depressive disorder, single episode, unspecified: Secondary | ICD-10-CM | POA: Insufficient documentation

## 2018-05-06 HISTORY — PX: MEMBRANE PEEL: SHX5967

## 2018-05-06 HISTORY — PX: GAS/FLUID EXCHANGE: SHX5334

## 2018-05-06 HISTORY — PX: PHOTOCOAGULATION WITH LASER: SHX6027

## 2018-05-06 HISTORY — DX: Depression, unspecified: F32.A

## 2018-05-06 HISTORY — PX: 25 GAUGE PARS PLANA VITRECTOMY WITH 20 GAUGE MVR PORT FOR MACULAR HOLE: SHX6096

## 2018-05-06 HISTORY — DX: Nausea with vomiting, unspecified: R11.2

## 2018-05-06 HISTORY — DX: Gastro-esophageal reflux disease without esophagitis: K21.9

## 2018-05-06 HISTORY — DX: Family history of other specified conditions: Z84.89

## 2018-05-06 HISTORY — DX: Other specified postprocedural states: Z98.890

## 2018-05-06 HISTORY — DX: Anxiety disorder, unspecified: F41.9

## 2018-05-06 HISTORY — DX: Major depressive disorder, single episode, unspecified: F32.9

## 2018-05-06 HISTORY — DX: Adverse effect of unspecified anesthetic, initial encounter: T41.45XA

## 2018-05-06 HISTORY — DX: Other complications of anesthesia, initial encounter: T88.59XA

## 2018-05-06 HISTORY — DX: Claustrophobia: F40.240

## 2018-05-06 LAB — CBC
HEMATOCRIT: 40 % (ref 36.0–46.0)
Hemoglobin: 12.6 g/dL (ref 12.0–15.0)
MCH: 28.3 pg (ref 26.0–34.0)
MCHC: 31.5 g/dL (ref 30.0–36.0)
MCV: 89.7 fL (ref 78.0–100.0)
Platelets: 239 10*3/uL (ref 150–400)
RBC: 4.46 MIL/uL (ref 3.87–5.11)
RDW: 12.8 % (ref 11.5–15.5)
WBC: 4.2 10*3/uL (ref 4.0–10.5)

## 2018-05-06 LAB — BASIC METABOLIC PANEL
Anion gap: 10 (ref 5–15)
BUN: 17 mg/dL (ref 8–23)
CALCIUM: 8.7 mg/dL — AB (ref 8.9–10.3)
CHLORIDE: 104 mmol/L (ref 98–111)
CO2: 25 mmol/L (ref 22–32)
CREATININE: 0.81 mg/dL (ref 0.44–1.00)
GFR calc non Af Amer: >60 mL/min (ref >60)
Glucose, Bld: 94 mg/dL (ref 70–99)
Potassium: 3.4 mmol/L — ABNORMAL LOW (ref 3.5–5.1)
Sodium: 139 mmol/L (ref 135–145)

## 2018-05-06 LAB — AUTOLOGOUS SERUM PATCH PREP

## 2018-05-06 SURGERY — 25 GAUGE PARS PLANA VITRECTOMY WITH 20 GAUGE MVR PORT FOR MACULAR HOLE
Anesthesia: General | Site: Eye | Laterality: Right

## 2018-05-06 MED ORDER — EPINEPHRINE PF 1 MG/ML IJ SOLN
INTRAOCULAR | Status: DC | PRN
  Administered 2018-05-06: 500.3 mL

## 2018-05-06 MED ORDER — ACETAZOLAMIDE SODIUM 500 MG IJ SOLR
INTRAMUSCULAR | Status: AC
  Filled 2018-05-06: qty 500

## 2018-05-06 MED ORDER — DEXAMETHASONE SODIUM PHOSPHATE 10 MG/ML IJ SOLN
INTRAMUSCULAR | Status: AC
  Filled 2018-05-06: qty 1

## 2018-05-06 MED ORDER — LIDOCAINE 2% (20 MG/ML) 5 ML SYRINGE
INTRAMUSCULAR | Status: AC
  Filled 2018-05-06: qty 5

## 2018-05-06 MED ORDER — MIDAZOLAM HCL 2 MG/2ML IJ SOLN
INTRAMUSCULAR | Status: AC
  Filled 2018-05-06: qty 2

## 2018-05-06 MED ORDER — BSS IO SOLN
INTRAOCULAR | Status: AC
  Filled 2018-05-06: qty 15

## 2018-05-06 MED ORDER — ATROPINE SULFATE 1 % OP SOLN
OPHTHALMIC | Status: DC | PRN
  Administered 2018-05-06: 1 [drp] via OPHTHALMIC

## 2018-05-06 MED ORDER — BUPIVACAINE HCL (PF) 0.75 % IJ SOLN
INTRAMUSCULAR | Status: DC | PRN
  Administered 2018-05-06: 20 mL

## 2018-05-06 MED ORDER — SODIUM CHLORIDE 0.9 % IV SOLN
INTRAVENOUS | Status: DC | PRN
  Administered 2018-05-06: 30 ug/min via INTRAVENOUS

## 2018-05-06 MED ORDER — VITAMIN E 180 MG (400 UNIT) PO CAPS
400.0000 [IU] | ORAL_CAPSULE | Freq: Every day | ORAL | Status: DC
  Administered 2018-05-06: 400 [IU] via ORAL
  Filled 2018-05-06 (×2): qty 1

## 2018-05-06 MED ORDER — PANTOPRAZOLE SODIUM 20 MG PO TBEC
20.0000 mg | DELAYED_RELEASE_TABLET | Freq: Every day | ORAL | Status: DC
  Administered 2018-05-06: 20 mg via ORAL
  Filled 2018-05-06: qty 1

## 2018-05-06 MED ORDER — LIDOCAINE 2% (20 MG/ML) 5 ML SYRINGE
INTRAMUSCULAR | Status: DC | PRN
  Administered 2018-05-06: 100 mg via INTRAVENOUS

## 2018-05-06 MED ORDER — ATROPINE SULFATE 1 % OP SOLN
OPHTHALMIC | Status: AC
  Filled 2018-05-06: qty 5

## 2018-05-06 MED ORDER — DORZOLAMIDE HCL-TIMOLOL MAL 2-0.5 % OP SOLN: 1.0000 [drp] | Freq: Two times a day (BID) | OPHTHALMIC | Status: DC

## 2018-05-06 MED ORDER — SODIUM CHLORIDE 0.45 % IV SOLN
INTRAVENOUS | Status: DC
  Administered 2018-05-06: 15:00:00 via INTRAVENOUS

## 2018-05-06 MED ORDER — LATANOPROST 0.005 % OP SOLN
1.0000 [drp] | Freq: Every day | OPHTHALMIC | Status: DC
  Filled 2018-05-06: qty 2.5

## 2018-05-06 MED ORDER — MIDAZOLAM HCL 5 MG/5ML IJ SOLN
INTRAMUSCULAR | Status: DC | PRN
  Administered 2018-05-06: 2 mg via INTRAVENOUS

## 2018-05-06 MED ORDER — SUGAMMADEX SODIUM 200 MG/2ML IV SOLN
INTRAVENOUS | Status: DC | PRN
  Administered 2018-05-06: 158 mg via INTRAVENOUS

## 2018-05-06 MED ORDER — GATIFLOXACIN 0.5 % OP SOLN
1.0000 [drp] | Freq: Four times a day (QID) | OPHTHALMIC | Status: DC
  Filled 2018-05-06: qty 2.5

## 2018-05-06 MED ORDER — MEPERIDINE HCL 50 MG/ML IJ SOLN: 6.2500 mg | INTRAMUSCULAR | Status: DC | PRN

## 2018-05-06 MED ORDER — TERIFLUNOMIDE 14 MG PO TABS: 14.0000 mg | ORAL_TABLET | Freq: Every day | ORAL | Status: DC

## 2018-05-06 MED ORDER — DEXAMETHASONE SODIUM PHOSPHATE 10 MG/ML IJ SOLN
INTRAMUSCULAR | Status: DC | PRN
  Administered 2018-05-06: 10 mg

## 2018-05-06 MED ORDER — HYPROMELLOSE (GONIOSCOPIC) 2.5 % OP SOLN
OPHTHALMIC | Status: AC
  Filled 2018-05-06: qty 15

## 2018-05-06 MED ORDER — FENTANYL CITRATE (PF) 250 MCG/5ML IJ SOLN
INTRAMUSCULAR | Status: DC | PRN
  Administered 2018-05-06: 50 ug via INTRAVENOUS

## 2018-05-06 MED ORDER — SODIUM CHLORIDE 0.9 % IJ SOLN
INTRAMUSCULAR | Status: AC
  Filled 2018-05-06: qty 10

## 2018-05-06 MED ORDER — DORZOLAMIDE HCL 2 % OP SOLN
1.0000 [drp] | Freq: Two times a day (BID) | OPHTHALMIC | Status: DC
  Filled 2018-05-06: qty 10

## 2018-05-06 MED ORDER — BUPIVACAINE HCL (PF) 0.75 % IJ SOLN
INTRAMUSCULAR | Status: AC
  Filled 2018-05-06: qty 10

## 2018-05-06 MED ORDER — GATIFLOXACIN 0.5 % OP SOLN
1.0000 [drp] | OPHTHALMIC | Status: DC | PRN
  Administered 2018-05-06 (×2): 1 [drp] via OPHTHALMIC
  Filled 2018-05-06: qty 2.5

## 2018-05-06 MED ORDER — CEFAZOLIN SODIUM-DEXTROSE 2-4 GM/100ML-% IV SOLN
INTRAVENOUS | Status: AC
  Filled 2018-05-06: qty 100

## 2018-05-06 MED ORDER — SODIUM CHLORIDE 0.9 % IV SOLN
INTRAVENOUS | Status: DC
  Administered 2018-05-06: 50 mL/h via INTRAVENOUS

## 2018-05-06 MED ORDER — BRIMONIDINE TARTRATE 0.2 % OP SOLN
1.0000 [drp] | Freq: Two times a day (BID) | OPHTHALMIC | Status: DC
  Filled 2018-05-06: qty 5

## 2018-05-06 MED ORDER — TRIAMCINOLONE ACETONIDE 40 MG/ML IJ SUSP
INTRAMUSCULAR | Status: AC
  Filled 2018-05-06: qty 5

## 2018-05-06 MED ORDER — BACITRACIN-POLYMYXIN B 500-10000 UNIT/GM OP OINT
TOPICAL_OINTMENT | OPHTHALMIC | Status: DC | PRN
  Administered 2018-05-06: 1 via OPHTHALMIC

## 2018-05-06 MED ORDER — ACETAZOLAMIDE SODIUM 500 MG IJ SOLR
500.0000 mg | Freq: Once | INTRAMUSCULAR | Status: AC
  Administered 2018-05-07: 500 mg via INTRAVENOUS
  Filled 2018-05-06: qty 500

## 2018-05-06 MED ORDER — TIMOLOL MALEATE 0.5 % OP SOLN
1.0000 [drp] | Freq: Two times a day (BID) | OPHTHALMIC | Status: DC
  Filled 2018-05-06: qty 5

## 2018-05-06 MED ORDER — DOXYCYCLINE HYCLATE 100 MG PO TABS
100.0000 mg | ORAL_TABLET | Freq: Two times a day (BID) | ORAL | Status: DC
  Administered 2018-05-06 (×2): 100 mg via ORAL
  Filled 2018-05-06 (×2): qty 1

## 2018-05-06 MED ORDER — EPINEPHRINE PF 1 MG/ML IJ SOLN
INTRAMUSCULAR | Status: AC
  Filled 2018-05-06: qty 1

## 2018-05-06 MED ORDER — ACETAMINOPHEN 325 MG PO TABS: 325.0000 mg | ORAL_TABLET | ORAL | Status: DC | PRN

## 2018-05-06 MED ORDER — FLUOXETINE HCL 20 MG PO CAPS: 20.0000 mg | ORAL_CAPSULE | Freq: Every day | ORAL | Status: DC

## 2018-05-06 MED ORDER — FENTANYL CITRATE (PF) 100 MCG/2ML IJ SOLN
INTRAMUSCULAR | Status: AC
  Filled 2018-05-06: qty 2

## 2018-05-06 MED ORDER — ROCURONIUM BROMIDE 10 MG/ML (PF) SYRINGE
PREFILLED_SYRINGE | INTRAVENOUS | Status: DC | PRN
  Administered 2018-05-06: 40 mg via INTRAVENOUS

## 2018-05-06 MED ORDER — ONDANSETRON HCL 4 MG/2ML IJ SOLN
INTRAMUSCULAR | Status: AC
  Filled 2018-05-06: qty 2

## 2018-05-06 MED ORDER — PREDNISOLONE ACETATE 1 % OP SUSP
1.0000 [drp] | Freq: Four times a day (QID) | OPHTHALMIC | Status: DC
  Filled 2018-05-06: qty 5

## 2018-05-06 MED ORDER — ONDANSETRON HCL 4 MG/2ML IJ SOLN
4.0000 mg | Freq: Four times a day (QID) | INTRAMUSCULAR | Status: DC | PRN
  Administered 2018-05-06 – 2018-05-07 (×2): 4 mg via INTRAVENOUS
  Filled 2018-05-06 (×2): qty 2

## 2018-05-06 MED ORDER — PROMETHAZINE HCL 25 MG/ML IJ SOLN: 6.2500 mg | INTRAMUSCULAR | Status: DC | PRN

## 2018-05-06 MED ORDER — HYALURONIDASE HUMAN 150 UNIT/ML IJ SOLN
INTRAMUSCULAR | Status: AC
  Filled 2018-05-06: qty 1

## 2018-05-06 MED ORDER — DIAZEPAM 5 MG PO TABS
5.0000 mg | ORAL_TABLET | Freq: Two times a day (BID) | ORAL | Status: DC | PRN
  Administered 2018-05-06: 5 mg via ORAL
  Filled 2018-05-06: qty 1

## 2018-05-06 MED ORDER — SODIUM HYALURONATE 10 MG/ML IO SOLN
INTRAOCULAR | Status: DC | PRN
  Administered 2018-05-06: 0.85 mL via INTRAOCULAR

## 2018-05-06 MED ORDER — BIMATOPROST 0.03 % EX SOLN: 1.0000 [drp] | Freq: Every day | CUTANEOUS | Status: DC

## 2018-05-06 MED ORDER — STERILE WATER FOR INJECTION IJ SOLN
INTRAMUSCULAR | Status: DC | PRN
  Administered 2018-05-06: 20 mL

## 2018-05-06 MED ORDER — ROCURONIUM BROMIDE 50 MG/5ML IV SOSY
PREFILLED_SYRINGE | INTRAVENOUS | Status: AC
  Filled 2018-05-06: qty 5

## 2018-05-06 MED ORDER — PRAVASTATIN SODIUM 10 MG PO TABS: 20.0000 mg | ORAL_TABLET | Freq: Every day | ORAL | Status: DC

## 2018-05-06 MED ORDER — BACITRACIN-POLYMYXIN B 500-10000 UNIT/GM OP OINT
TOPICAL_OINTMENT | OPHTHALMIC | Status: AC
  Filled 2018-05-06: qty 3.5

## 2018-05-06 MED ORDER — LEVOTHYROXINE SODIUM 112 MCG PO TABS
112.0000 ug | ORAL_TABLET | Freq: Every day | ORAL | Status: DC
  Administered 2018-05-07: 112 ug via ORAL
  Filled 2018-05-06: qty 1

## 2018-05-06 MED ORDER — DORZOLAMIDE HCL 2 % OP SOLN
1.0000 [drp] | Freq: Three times a day (TID) | OPHTHALMIC | Status: DC
  Filled 2018-05-06: qty 10

## 2018-05-06 MED ORDER — DEXAMETHASONE SODIUM PHOSPHATE 10 MG/ML IJ SOLN
INTRAMUSCULAR | Status: DC | PRN
  Administered 2018-05-06: 4 mg via INTRAVENOUS

## 2018-05-06 MED ORDER — CYCLOPENTOLATE HCL 1 % OP SOLN
1.0000 [drp] | OPHTHALMIC | Status: DC | PRN
  Administered 2018-05-06 (×2): 1 [drp] via OPHTHALMIC
  Filled 2018-05-06: qty 2

## 2018-05-06 MED ORDER — BACITRACIN-POLYMYXIN B 500-10000 UNIT/GM OP OINT
1.0000 "application " | TOPICAL_OINTMENT | Freq: Three times a day (TID) | OPHTHALMIC | Status: DC
  Filled 2018-05-06: qty 3.5

## 2018-05-06 MED ORDER — TEMAZEPAM 15 MG PO CAPS: 15.0000 mg | ORAL_CAPSULE | Freq: Every evening | ORAL | Status: DC | PRN

## 2018-05-06 MED ORDER — FENTANYL CITRATE (PF) 250 MCG/5ML IJ SOLN
INTRAMUSCULAR | Status: AC
  Filled 2018-05-06: qty 5

## 2018-05-06 MED ORDER — HYDROCODONE-ACETAMINOPHEN 5-325 MG PO TABS
1.0000 | ORAL_TABLET | ORAL | Status: DC | PRN
  Administered 2018-05-06: 1 via ORAL
  Administered 2018-05-06 (×2): 2 via ORAL
  Filled 2018-05-06: qty 1
  Filled 2018-05-06 (×2): qty 2

## 2018-05-06 MED ORDER — SODIUM HYALURONATE 10 MG/ML IO SOLN
INTRAOCULAR | Status: AC
  Filled 2018-05-06: qty 0.85

## 2018-05-06 MED ORDER — ASPIRIN 81 MG PO CHEW: 81.0000 mg | CHEWABLE_TABLET | Freq: Every day | ORAL | Status: DC

## 2018-05-06 MED ORDER — LIDOCAINE HCL 2 % IJ SOLN
INTRAMUSCULAR | Status: AC
  Filled 2018-05-06: qty 20

## 2018-05-06 MED ORDER — CEFAZOLIN SODIUM-DEXTROSE 2-4 GM/100ML-% IV SOLN
2.0000 g | INTRAVENOUS | Status: AC
  Administered 2018-05-06: 2 g via INTRAVENOUS

## 2018-05-06 MED ORDER — MORPHINE SULFATE (PF) 2 MG/ML IV SOLN: 1.0000 mg | INTRAVENOUS | Status: DC | PRN

## 2018-05-06 MED ORDER — STERILE WATER FOR INJECTION IJ SOLN
INTRAMUSCULAR | Status: AC
  Filled 2018-05-06: qty 20

## 2018-05-06 MED ORDER — POLYMYXIN B SULFATE 500000 UNITS IJ SOLR
INTRAMUSCULAR | Status: AC
  Filled 2018-05-06: qty 500000

## 2018-05-06 MED ORDER — POTASSIUM CHLORIDE CRYS ER 20 MEQ PO TBCR
20.0000 meq | EXTENDED_RELEASE_TABLET | Freq: Two times a day (BID) | ORAL | Status: DC
  Administered 2018-05-06 (×2): 20 meq via ORAL
  Filled 2018-05-06 (×2): qty 1

## 2018-05-06 MED ORDER — ZOLPIDEM TARTRATE 5 MG PO TABS
5.0000 mg | ORAL_TABLET | Freq: Every day | ORAL | Status: DC
  Administered 2018-05-06: 5 mg via ORAL
  Filled 2018-05-06: qty 1

## 2018-05-06 MED ORDER — TETRACAINE HCL 0.5 % OP SOLN
2.0000 [drp] | Freq: Once | OPHTHALMIC | Status: DC
  Filled 2018-05-06: qty 4

## 2018-05-06 MED ORDER — EPHEDRINE SULFATE-NACL 50-0.9 MG/10ML-% IV SOSY
PREFILLED_SYRINGE | INTRAVENOUS | Status: DC | PRN
  Administered 2018-05-06: 10 mg via INTRAVENOUS

## 2018-05-06 MED ORDER — CEFTAZIDIME 1 G IJ SOLR
INTRAMUSCULAR | Status: AC
  Filled 2018-05-06: qty 1

## 2018-05-06 MED ORDER — ONDANSETRON HCL 4 MG/2ML IJ SOLN
INTRAMUSCULAR | Status: DC | PRN
  Administered 2018-05-06: 4 mg via INTRAVENOUS

## 2018-05-06 MED ORDER — PROPOFOL 10 MG/ML IV BOLUS
INTRAVENOUS | Status: DC | PRN
  Administered 2018-05-06: 140 mg via INTRAVENOUS

## 2018-05-06 MED ORDER — DORZOLAMIDE HCL-TIMOLOL MAL 2-0.5 % OP SOLN
1.0000 [drp] | Freq: Two times a day (BID) | OPHTHALMIC | Status: DC
  Administered 2018-05-06: 1 [drp] via OPHTHALMIC

## 2018-05-06 MED ORDER — BUPIVACAINE-EPINEPHRINE (PF) 0.25% -1:200000 IJ SOLN
INTRAMUSCULAR | Status: AC
  Filled 2018-05-06: qty 30

## 2018-05-06 MED ORDER — MAGNESIUM HYDROXIDE 400 MG/5ML PO SUSP
15.0000 mL | Freq: Four times a day (QID) | ORAL | Status: DC | PRN
  Administered 2018-05-06: 30 mL via ORAL
  Filled 2018-05-06: qty 30

## 2018-05-06 MED ORDER — TROPICAMIDE 1 % OP SOLN
1.0000 [drp] | OPHTHALMIC | Status: DC | PRN
  Administered 2018-05-06 (×2): 1 [drp] via OPHTHALMIC
  Filled 2018-05-06: qty 15

## 2018-05-06 MED ORDER — PHENYLEPHRINE HCL 2.5 % OP SOLN
1.0000 [drp] | OPHTHALMIC | Status: AC | PRN
  Administered 2018-05-06 (×3): 1 [drp] via OPHTHALMIC
  Filled 2018-05-06: qty 2

## 2018-05-06 MED ORDER — FENTANYL CITRATE (PF) 100 MCG/2ML IJ SOLN: 25.0000 ug | INTRAMUSCULAR | Status: DC | PRN

## 2018-05-06 MED ORDER — LACTATED RINGERS IV SOLN
INTRAVENOUS | Status: DC | PRN
  Administered 2018-05-06: 12:00:00 via INTRAVENOUS

## 2018-05-06 MED ORDER — TRIAMTERENE-HCTZ 37.5-25 MG PO CAPS
1.0000 | ORAL_CAPSULE | Freq: Every day | ORAL | Status: DC
  Filled 2018-05-06 (×2): qty 1

## 2018-05-06 MED ORDER — BSS PLUS IO SOLN
INTRAOCULAR | Status: AC
  Filled 2018-05-06: qty 500

## 2018-05-06 SURGICAL SUPPLY — 66 items
BALL CTTN LRG ABS STRL LF (GAUZE/BANDAGES/DRESSINGS) ×3
BLADE EYE CATARACT 19 1.4 BEAV (BLADE) IMPLANT
BLADE MVR KNIFE 19G (BLADE) IMPLANT
BLADE MVR KNIFE 20G (BLADE) ×3 IMPLANT
CANNULA VLV SOFT TIP 25G (OPHTHALMIC) ×1 IMPLANT
CANNULA VLV SOFT TIP 25GA (OPHTHALMIC) ×6 IMPLANT
CORDS BIPOLAR (ELECTRODE) ×3 IMPLANT
COTTONBALL LRG STERILE PKG (GAUZE/BANDAGES/DRESSINGS) ×9 IMPLANT
COVER MAYO STAND STRL (DRAPES) IMPLANT
DRAPE INCISE 51X51 W/FILM STRL (DRAPES) ×2 IMPLANT
DRAPE OPHTHALMIC 77X100 STRL (CUSTOM PROCEDURE TRAY) ×3 IMPLANT
ERASER HMR WETFIELD 23G BP (MISCELLANEOUS) ×3 IMPLANT
FILTER BLUE MILLIPORE (MISCELLANEOUS) ×4 IMPLANT
FILTER STRAW FLUID ASPIR (MISCELLANEOUS) ×3 IMPLANT
FORCEPS GRIESHABER ILM 25G A (INSTRUMENTS) ×2 IMPLANT
GAS AUTO FILL CONSTEL (OPHTHALMIC) ×3
GAS AUTO FILL CONSTELLATION (OPHTHALMIC) ×1 IMPLANT
GLOVE SS BIOGEL STRL SZ 6.5 (GLOVE) ×1 IMPLANT
GLOVE SS BIOGEL STRL SZ 7 (GLOVE) ×1 IMPLANT
GLOVE SUPERSENSE BIOGEL SZ 6.5 (GLOVE) ×2
GLOVE SUPERSENSE BIOGEL SZ 7 (GLOVE) ×2
GLOVE SURG 8.5 LATEX PF (GLOVE) ×3 IMPLANT
GOWN STRL REUS W/ TWL LRG LVL3 (GOWN DISPOSABLE) ×3 IMPLANT
GOWN STRL REUS W/TWL LRG LVL3 (GOWN DISPOSABLE) ×9
HANDLE PNEUMATIC FOR CONSTEL (OPHTHALMIC) ×2 IMPLANT
KIT BASIN OR (CUSTOM PROCEDURE TRAY) ×3 IMPLANT
KNIFE GRIESHABER SHARP 2.5MM (MISCELLANEOUS) IMPLANT
MICROPICK 25G (MISCELLANEOUS)
NDL 18GX1X1/2 (RX/OR ONLY) (NEEDLE) ×1 IMPLANT
NDL 25GX 5/8IN NON SAFETY (NEEDLE) ×1 IMPLANT
NDL FILTER BLUNT 18X1 1/2 (NEEDLE) IMPLANT
NDL HYPO 30X.5 LL (NEEDLE) IMPLANT
NEEDLE 18GX1X1/2 (RX/OR ONLY) (NEEDLE) ×3 IMPLANT
NEEDLE 25GX 5/8IN NON SAFETY (NEEDLE) ×6 IMPLANT
NEEDLE FILTER BLUNT 18X 1/2SAF (NEEDLE) ×2
NEEDLE FILTER BLUNT 18X1 1/2 (NEEDLE) ×1 IMPLANT
NEEDLE HYPO 30X.5 LL (NEEDLE) ×3 IMPLANT
NS IRRIG 1000ML POUR BTL (IV SOLUTION) ×3 IMPLANT
PACK FRAGMATOME (OPHTHALMIC) IMPLANT
PACK VITRECTOMY CUSTOM (CUSTOM PROCEDURE TRAY) ×1 IMPLANT
PAD ARMBOARD 7.5X6 YLW CONV (MISCELLANEOUS) ×6 IMPLANT
PAK PIK VITRECTOMY CVS 25GA (OPHTHALMIC) ×3 IMPLANT
PIC ILLUMINATED 25G (OPHTHALMIC) ×3
PICK MICROPICK 25G (MISCELLANEOUS) IMPLANT
PIK ILLUMINATED 25G (OPHTHALMIC) ×1 IMPLANT
PROBE LASER ILLUM FLEX CVD 25G (OPHTHALMIC) ×2 IMPLANT
REPL STRA BRUSH NDL (NEEDLE) ×1 IMPLANT
REPL STRA BRUSH NEEDLE (NEEDLE) ×3 IMPLANT
RESERVOIR BACK FLUSH (MISCELLANEOUS) ×3 IMPLANT
ROLLS DENTAL (MISCELLANEOUS) ×6 IMPLANT
SCRAPER DIAMOND 25GA (OPHTHALMIC RELATED) IMPLANT
SCRAPER DIAMOND DUST MEMBRANE (MISCELLANEOUS) ×3 IMPLANT
SPONGE SURGIFOAM ABS GEL 12-7 (HEMOSTASIS) ×3 IMPLANT
STOPCOCK 4 WAY LG BORE MALE ST (IV SETS) IMPLANT
SUT CHROMIC 7 0 TG140 8 (SUTURE) IMPLANT
SUT ETHILON 9 0 TG140 8 (SUTURE) ×3 IMPLANT
SUT POLY NON ABSORB 10-0 8 STR (SUTURE) IMPLANT
SUT SILK 4 0 RB 1 (SUTURE) IMPLANT
SYR 10ML LL (SYRINGE) IMPLANT
SYR 20CC LL (SYRINGE) ×3 IMPLANT
SYR 5ML LL (SYRINGE) IMPLANT
SYR BULB 3OZ (MISCELLANEOUS) ×3 IMPLANT
SYR TB 1ML LUER SLIP (SYRINGE) ×3 IMPLANT
TUBING HIGH PRESS EXTEN 6IN (TUBING) IMPLANT
WATER STERILE IRR 1000ML POUR (IV SOLUTION) ×3 IMPLANT
WIPE INSTRUMENT VISIWIPE 73X73 (MISCELLANEOUS) IMPLANT

## 2018-05-06 NOTE — Transfer of Care (Signed)
Immediate Anesthesia Transfer of Care Note  Patient: Leslie Ryan  Procedure(s) Performed: 25 GAUGE PARS PLANA VITRECTOMY WITH 20 GAUGE MVR PORT FOR MACULAR HOLE (Right )  Patient Location: PACU  Anesthesia Type:General  Level of Consciousness: awake, alert  and oriented  Airway & Oxygen Therapy: Patient Spontanous Breathing and Patient connected to nasal cannula oxygen  Post-op Assessment: Report given to RN and Post -op Vital signs reviewed and stable  Post vital signs: Reviewed and stable  Last Vitals:  Vitals Value Taken Time  BP 129/68 05/06/2018  1:14 PM  Temp    Pulse 84 05/06/2018  1:17 PM  Resp 15 05/06/2018  1:17 PM  SpO2 97 % 05/06/2018  1:17 PM  Vitals shown include unvalidated device data.  Last Pain:  Vitals:   05/06/18 0946  TempSrc: Oral         Complications: No apparent anesthesia complications

## 2018-05-06 NOTE — Anesthesia Procedure Notes (Signed)
Procedure Name: Intubation Date/Time: 05/06/2018 11:40 AM Performed by: Marsa Aris, CRNA Pre-anesthesia Checklist: Patient identified, Emergency Drugs available, Suction available and Patient being monitored Patient Re-evaluated:Patient Re-evaluated prior to induction Oxygen Delivery Method: Circle System Utilized Preoxygenation: Pre-oxygenation with 100% oxygen Induction Type: IV induction Ventilation: Mask ventilation without difficulty Laryngoscope Size: Miller and 2 Grade View: Grade I Tube type: Oral Tube size: 7.0 mm Number of attempts: 1 Airway Equipment and Method: Stylet Placement Confirmation: ETT inserted through vocal cords under direct vision,  positive ETCO2 and breath sounds checked- equal and bilateral Secured at: 21 cm Tube secured with: Tape Dental Injury: Teeth and Oropharynx as per pre-operative assessment  Comments: No change in dentition from pre-procedure

## 2018-05-06 NOTE — Progress Notes (Signed)
Scanner at bedside will not charge on base.  Have called for replacement.

## 2018-05-06 NOTE — Anesthesia Preprocedure Evaluation (Addendum)
Anesthesia Evaluation  Patient identified by MRN, date of birth, ID band Patient awake    Reviewed: Allergy & Precautions, NPO status , Patient's Chart, lab work & pertinent test results  History of Anesthesia Complications (+) PONV, Family history of anesthesia reaction and history of anesthetic complications  Airway Mallampati: II  TM Distance: >3 FB Neck ROM: Full    Dental  (+) Dental Advisory Given   Pulmonary neg pulmonary ROS, former smoker,    Pulmonary exam normal breath sounds clear to auscultation       Cardiovascular negative cardio ROS Normal cardiovascular exam Rhythm:Regular Rate:Normal     Neuro/Psych PSYCHIATRIC DISORDERS Anxiety Depression  Neuromuscular disease    GI/Hepatic Neg liver ROS, GERD  ,  Endo/Other  Hypothyroidism   Renal/GU negative Renal ROS     Musculoskeletal negative musculoskeletal ROS (+)   Abdominal (+) + obese,   Peds  Hematology negative hematology ROS (+)   Anesthesia Other Findings   Reproductive/Obstetrics negative OB ROS                            Anesthesia Physical Anesthesia Plan  ASA: III  Anesthesia Plan: General   Post-op Pain Management:    Induction: Intravenous  PONV Risk Score and Plan: 4 or greater and Ondansetron, Dexamethasone and Treatment may vary due to age or medical condition  Airway Management Planned: Oral ETT  Additional Equipment: None  Intra-op Plan:   Post-operative Plan: Extubation in OR  Informed Consent: I have reviewed the patients History and Physical, chart, labs and discussed the procedure including the risks, benefits and alternatives for the proposed anesthesia with the patient or authorized representative who has indicated his/her understanding and acceptance.   Dental advisory given  Plan Discussed with: CRNA  Anesthesia Plan Comments:         Anesthesia Quick Evaluation

## 2018-05-06 NOTE — Op Note (Signed)
NAMELUCILLA, Ryan [facility] MEDICAL RECORD PQ:[id number] ACCOUNT [id number] DATE OF BIRTH:[birth year] FACILITY: [facility] LOCATION: [facility] PHYSICIAN:Ugochi Henzler D. Annell Canty, MD  OPERATIVE REPORT  DATE OF PROCEDURE:  05/06/2018  DIAGNOSIS:  Macular hole, right eye.  PROCEDURE:  Pars plana vitrectomy, retinal photocoagulation, membrane peel, gas fluid exchange, serum patch, gas injection in the right eye.  SURGEON:  Tempie Hoist, MD  ASSISTANT:  Deatra Ina, SA.  ANESTHESIA:  General.  DESCRIPTION OF PROCEDURE:  Usual prep and drape.  The indirect ophthalmoscope laser was moved into place, 740 burns were placed around the retinal periphery and weak areas of retina.  The power was between 400 and 1000 milliwatts, 1000 microns each and  0.1 seconds each.  Attention was then carried to the pars plana area where 25 gauge trocars were placed at 8 and 10 o'clock.  Infusion at 8 o'clock.  The conjunctiva was incised at 2 o'clock.  The sclera was cauterized.  A 3-layered scleral wound was  made with a diamond knife and an MVR incision.  The contact lens ring was anchored into place at 6 and 12 o'clock with 7-0 Vicryl suture.  Provisc placed on the corneal surface and the flat contact lens was placed.  Pars plana vitrectomy begun just  behind the cataractous lens.  The vitrectomy was carried down in a core fashion down toward the macular region.  The macular hole was seen.  Dense membranes were encountered.  The vitrectomy was carried into the mid periphery and surface proliferation  was removed.  Once the core vitreous was removed, a silicone tipped suction line was brought into the field and into the eye.  The FISH strike sign occurred and the macular hole edges rippled as posterior hyaloid was removed from the macular hole region.   The posterior hyaloid was removed with the vitreous cutter.  The diamond dusted membrane scraper was then placed in the eye and drawn down to the macular surface.  The ILM was  engaged with the diamond dusted membrane scraper for 360 degrees around the  macular hole.  The ILM forceps were used to strip the ILM.  The 30 degree prismatic lens was then moved into place on the corneal surface.  Vitreous cutter was used for vitrectomy into the mid periphery and into the far periphery.  Once all vitreous  membranes were removed for 360 degrees, attention was carried back to the macular region.  The magnifying contact lens was placed.  Additional peeling with the diamond dusted membrane scraper allowed the edges of the macular hole to be mobile.  The edges  of the ILM were attached to the edges of the macular hole.  This excess ILM was pushed into the macular hole.  The remaining ILM was removed with the vitreous cutter and forceps.  A gas fluid exchange was carried out.  Sufficient time was allowed for  additional fluid to track down the walls of the eye and collect in the posterior segment.  Serum patch was prepared.  During this time, the C3F8 was prepared to a 14% concentration.  Additional posterior fluid was removed with the [place] ophthalmics  brush.  Serum patch was delivered.  Excess serum was removed with the [place] ophthalmics brush.  C3F8 was exchanged for intravitreal gas.  The scleral wound at 2 o'clock was closed with 9-0 nylon.  The conjunctiva was closed with wet field cautery.  The  25 gauge trocars were removed from the eye.  The wounds were tested and found to be  secure.  Polymyxin and ceftazidime were rinsed around the globe for antibiotic coverage.  Decadron 10 mg was injected into the lower subconjunctival space.  Atropine  solution was applied.  Marcaine was injected around the globe for postoperative pain.  Polysporin ophthalmic ointment, a patch and a shield were placed.  Closing pressure was 10 with a [place] tonometer.  The patient was awakened and taken to recovery  in satisfactory condition.  COMPLICATIONS:  None.  DURATION:  1 hour 20  minutes.  TN/NUANCE  D:05/06/2018 T:05/06/2018 JOB:[id number]

## 2018-05-06 NOTE — H&P (Signed)
I examined the patient today and there is no change in the medical status 

## 2018-05-06 NOTE — Brief Op Note (Signed)
Brief Operative note   Preoperative diagnosis:  MACULAR HOLE RIGHT EYE Postoperative diagnosis  Macular hole right eye   Procedures: Pars plana vitrectomy, membrane peel, ILM peel, gas injection, laser right eye  Surgeon:  Hayden Pedro, MD...  Assistant:  Deatra Ina SA    Anesthesia: General  Specimen: none  Estimated blood loss:  1cc  Complications: none  Patient sent to PACU in good condition  Composed by Hayden Pedro MD  Dictation number: [id number]

## 2018-05-07 ENCOUNTER — Encounter (HOSPITAL_COMMUNITY): Payer: Self-pay | Admitting: General Practice

## 2018-05-07 DIAGNOSIS — H35341 Macular cyst, hole, or pseudohole, right eye: Secondary | ICD-10-CM | POA: Diagnosis not present

## 2018-05-07 DIAGNOSIS — Z23 Encounter for immunization: Secondary | ICD-10-CM | POA: Diagnosis not present

## 2018-05-07 DIAGNOSIS — H9190 Unspecified hearing loss, unspecified ear: Secondary | ICD-10-CM | POA: Diagnosis not present

## 2018-05-07 DIAGNOSIS — E7800 Pure hypercholesterolemia, unspecified: Secondary | ICD-10-CM | POA: Diagnosis not present

## 2018-05-07 DIAGNOSIS — K900 Celiac disease: Secondary | ICD-10-CM | POA: Diagnosis not present

## 2018-05-07 DIAGNOSIS — E11319 Type 2 diabetes mellitus with unspecified diabetic retinopathy without macular edema: Secondary | ICD-10-CM | POA: Diagnosis not present

## 2018-05-07 MED ORDER — BACITRACIN-POLYMYXIN B 500-10000 UNIT/GM OP OINT: 1.0000 "application " | TOPICAL_OINTMENT | Freq: Three times a day (TID) | OPHTHALMIC | 0 refills | Status: DC

## 2018-05-07 MED ORDER — INFLUENZA VAC SPLIT HIGH-DOSE 0.5 ML IM SUSY
0.5000 mL | PREFILLED_SYRINGE | INTRAMUSCULAR | Status: AC
  Administered 2018-05-07: 0.5 mL via INTRAMUSCULAR
  Filled 2018-05-07: qty 0.5

## 2018-05-07 MED ORDER — PREDNISOLONE ACETATE 1 % OP SUSP: 1.0000 [drp] | Freq: Four times a day (QID) | OPHTHALMIC | 0 refills | Status: DC

## 2018-05-07 MED ORDER — TRIAMTERENE-HCTZ 37.5-25 MG PO TABS
1.0000 | ORAL_TABLET | Freq: Every day | ORAL | Status: DC
  Filled 2018-05-07: qty 1

## 2018-05-07 MED ORDER — GATIFLOXACIN 0.5 % OP SOLN: 1.0000 [drp] | Freq: Four times a day (QID) | OPHTHALMIC | Status: DC

## 2018-05-07 MED ORDER — BRIMONIDINE TARTRATE 0.2 % OP SOLN: 1.0000 [drp] | Freq: Two times a day (BID) | OPHTHALMIC | 12 refills | Status: DC

## 2018-05-07 NOTE — Anesthesia Postprocedure Evaluation (Signed)
Anesthesia Post Note  Patient: Kamree Wiens [place]  Procedure(s) Performed: 25 GAUGE PARS PLANA VITRECTOMY WITH 20 GAUGE MVR PORT FOR MACULAR HOLE (Right Eye) MEMBRANE PEEL (Right Eye) GAS/FLUID EXCHANGE C3F8 (Right Eye) PHOTOCOAGULATION WITH LASER (Right Eye)     Patient location during evaluation: PACU Anesthesia Type: General Level of consciousness: sedated and patient cooperative Pain management: pain level controlled Vital Signs Assessment: post-procedure vital signs reviewed and stable Respiratory status: spontaneous breathing Cardiovascular status: stable Anesthetic complications: no    Last Vitals:  Vitals:   05/06/18 2155 05/07/18 0446  BP: 116/63 (!) 103/56  Pulse: 64 68  Resp: 16 16  Temp: 36.8 C 36.5 C  SpO2: 95% 96%    Last Pain:  Vitals:   05/07/18 0446  TempSrc: Oral  PainSc:                  Nolon Nations

## 2018-05-07 NOTE — Progress Notes (Signed)
05/07/2018, 6:13 AM  Mental Status:  Awake, Alert, Oriented  Anterior segment: Cornea  Clear    Anterior Chamber Clear    Lens:   Cataract  Intra Ocular Pressure 20 mmHg with Tonopen  Vitreous: Clear 90%gas bubble   Retina:  Attached Good laser reaction  view  Impression: Excellent result Retina attached   Final Diagnosis: Principal Problem:   Macular hole, right Active Problems:   Macular hole, right eye   Plan: start post operative eye drops. Add Alphagan BID.   Discharge to home.  Give post operative instructions  Leslie Ryan 05/07/2018, 6:13 AM

## 2018-05-07 NOTE — Progress Notes (Signed)
Discharge instructions reviewed with pt and pt had "eye bag" with all prescribed eye medications.  Pt verbalized understanding and had no questions.  Pt discharged in stable condition via wheelchair with friend.  Eliezer Bottom [place]

## 2018-05-07 NOTE — Discharge Summary (Signed)
Discharge summary not needed on [facility] patients per medical records. 

## 2018-05-12 ENCOUNTER — Encounter (INDEPENDENT_AMBULATORY_CARE_PROVIDER_SITE_OTHER): Payer: Medicare Other | Admitting: Ophthalmology

## 2018-05-12 DIAGNOSIS — H35341 Macular cyst, hole, or pseudohole, right eye: Secondary | ICD-10-CM

## 2018-05-21 ENCOUNTER — Ambulatory Visit: Payer: Medicare Other | Admitting: Neurology

## 2018-06-02 ENCOUNTER — Encounter (INDEPENDENT_AMBULATORY_CARE_PROVIDER_SITE_OTHER): Payer: Medicare Other | Admitting: Ophthalmology

## 2018-06-02 DIAGNOSIS — H35341 Macular cyst, hole, or pseudohole, right eye: Secondary | ICD-10-CM

## 2018-07-21 DIAGNOSIS — G4700 Insomnia, unspecified: Secondary | ICD-10-CM | POA: Diagnosis not present

## 2018-07-21 DIAGNOSIS — G35 Multiple sclerosis: Secondary | ICD-10-CM | POA: Diagnosis not present

## 2018-07-21 DIAGNOSIS — K219 Gastro-esophageal reflux disease without esophagitis: Secondary | ICD-10-CM | POA: Diagnosis not present

## 2018-07-21 DIAGNOSIS — E038 Other specified hypothyroidism: Secondary | ICD-10-CM | POA: Diagnosis not present

## 2018-08-11 ENCOUNTER — Encounter (INDEPENDENT_AMBULATORY_CARE_PROVIDER_SITE_OTHER): Payer: Medicare Other | Admitting: Ophthalmology

## 2018-08-11 DIAGNOSIS — H2513 Age-related nuclear cataract, bilateral: Secondary | ICD-10-CM | POA: Diagnosis not present

## 2018-08-11 DIAGNOSIS — H43813 Vitreous degeneration, bilateral: Secondary | ICD-10-CM | POA: Diagnosis not present

## 2018-08-11 DIAGNOSIS — H35341 Macular cyst, hole, or pseudohole, right eye: Secondary | ICD-10-CM | POA: Diagnosis not present

## 2018-08-11 DIAGNOSIS — H35372 Puckering of macula, left eye: Secondary | ICD-10-CM | POA: Diagnosis not present

## 2018-08-15 ENCOUNTER — Encounter: Payer: Self-pay | Admitting: Neurology

## 2018-08-15 ENCOUNTER — Ambulatory Visit (INDEPENDENT_AMBULATORY_CARE_PROVIDER_SITE_OTHER): Payer: Medicare Other | Admitting: Neurology

## 2018-08-15 VITALS — BP 136/77 | HR 80

## 2018-08-15 DIAGNOSIS — R260 Ataxic gait: Secondary | ICD-10-CM | POA: Diagnosis not present

## 2018-08-15 DIAGNOSIS — F329 Major depressive disorder, single episode, unspecified: Secondary | ICD-10-CM

## 2018-08-15 DIAGNOSIS — G35 Multiple sclerosis: Secondary | ICD-10-CM

## 2018-08-15 DIAGNOSIS — G4733 Obstructive sleep apnea (adult) (pediatric): Secondary | ICD-10-CM

## 2018-08-15 DIAGNOSIS — H8109 Meniere's disease, unspecified ear: Secondary | ICD-10-CM

## 2018-08-15 DIAGNOSIS — R42 Dizziness and giddiness: Secondary | ICD-10-CM | POA: Diagnosis not present

## 2018-08-15 DIAGNOSIS — F32A Depression, unspecified: Secondary | ICD-10-CM

## 2018-08-15 DIAGNOSIS — G4700 Insomnia, unspecified: Secondary | ICD-10-CM

## 2018-08-15 MED ORDER — DIAZEPAM 5 MG PO TABS: ORAL_TABLET | ORAL | 5 refills | Status: DC

## 2018-08-15 MED ORDER — ZOLPIDEM TARTRATE 10 MG PO TABS: 10.0000 mg | ORAL_TABLET | Freq: Every day | ORAL | 1 refills | Status: DC

## 2018-08-15 MED ORDER — TRIAMTERENE-HCTZ 37.5-25 MG PO CAPS: 1.0000 | ORAL_CAPSULE | Freq: Every day | ORAL | 3 refills | Status: DC

## 2018-08-15 NOTE — Progress Notes (Signed)
[facility]  PATIENT: Leslie Ryan DOB: [birth year]  REFERRING CLINICIAN: Burnard Bunting HISTORY FROM: patient REASON FOR VISIT: MS   HISTORICAL  CHIEF COMPLAINT:  Chief Complaint  Patient presents with  . Multiple Sclerosis    She has continued taking Aubagio.  No new concerns with MS.  Meniere's Disease is stable.  Ambien works well for sleep.  Prozac is helping her depression.  Her stress is high due to her husband being placed in a memory care unit and she is raising her 34 year old granddaughter.     HISTORY OF PRESENT ILLNESS:  Leslie Ryan is a 67 year old woman with multiple sclerosis.     Update 08/15/2018: She feels her MS is stable and she has no new symptoms.   She tolerates Aubagio well and labwork has been fine. She repots having labwork with her PCP over the summer and everything was fine.      Gait, strength are ok.  She has numbness in her fingers bilaterally and sometimes in her feet.   She gets some muscle cramps.   She has a hard time sleeping.    Hr spasticity and cramps seem worse at night.   She has probable OSA waking up with a snort many nights.   She has some daytime sleepiness but it is not severe.  EPWORTH SLEEPINESS SCALE  On a scale of 0 - 3 what is the chance of dozing:  Sitting and Reading:   3 Watching TV:    2 Sitting inactive in a public place:  0 Passenger in car for one hour:  0 Lying down to rest in the afternoon: 2 Sitting and talking to someone:  0 Sitting quietly after lunch:  0 In a car, stopped in traffic:  0  Total (out of 24):     7/24   Normal sleepiness   She notes some depression.  Her husband is in a memory care unit now for vascular/Alzheimer dementia.     She had a vitrectomy in September 2019 and is going to have cataract surgery soon.     Update 11/15/2017: She feels her MS is stable but she is under a lot of stress with her husband having moderate AD. Mood is poor with dperession and  irritability.         She wobbles and stumbles some with gait but falls are very rare.    Her gait issues have progressed gradually.   She feels the leg spasticity is helped by valium.  She feels fatigue is worse.   She is sleeping worse due to her husband's AD issues (he wanders and sometimes wakes up gets dressed and is ready for the day.  He has sundowning and Seroquel has not helped him much).     She has Meniere's and takes Dyazide if she gets any vertigo      From 03/27/2017: She feels her MS is mostly ok but family issues are adding stress.   Her husband has AD and she primarily cares for her granddaughter  MS:   She is on Aubagio since 2014.   She is tolerating it well. She gets labwork several times a year with Dr. Reynaldo Minium (PCP)  She has not had any exacerbation.  Gait/strength/sensatoin:  Gait is stable.   It is mildly spastic and ataxic.   She stumbles some but no falls.   She has mild leg weakness and spasticity, slightly worse on her left.     She notes mild weakness  in the legs. Diazepam has helped a little bit with this spasticity.  She is not exercising because she feels so weak afterwards.    Bladder/Bowel:  She denies any significant difficulty with her bladder. Bowel function is fine.   Vision:  Vision is doing ok.   No changes..   She has been diagnosed with macular degeneration  Fatigue/sleep:   She notes a lot of  fatigue, and doing chores especially tires her out.       Fatigue is more physical than mental.  She has insomnia that is helped by Ambien. However, her RLS seems to be doing worse and she can't get comfortable some nights (1-2 times a month).     Sleep maintenance is more of a problem than sleep onset.   Once asleep, she usually gets at least 5 hours sleep but wakes up a few times some nights  Mood/Cognition:    She has had depression And notes more stress the last year. (mother died 2015-11-11 ago and husband has AD).   She is on fluoxetine and feels it does help  some..  She also occasionally takes diazepam for anxiety.   She denies major cognitive issues but notes mild memory problems at times.    Meniere's:  She has had a couple episodes of Mnire's disease with severe vertigo and N/V.   The combination of the diazepam and Dyazide has greatly helped.      We dicussed taking an additional  valium on those rare days when she has mild vertigo and is concerned it will intensify.   .      MS History:  In the mid 1980s, she presented with right optic neuritis while working at [employer]. A lumbar puncture was inconclusive but an MRI of the brain was consistent with MS a few years later. In the 1980s and early 1990s, she received multiple rounds of IV steroids for occasional exacerbations involving vision or gait. In 11/10/92, she started Betaseron stopped quickly due to severe injection site reactions. In January 1997, after an exacerbation she was started on Avonex and continued on Avonex for the next 15 years or so. She had some issues with compliance on some occasions.   In the late 1990s, she began to have more permanent gait issues the right foot drop that slowly worsened. I saw early MRI reports from 11-10-98 showing 2 foci at C6 and C7 on the cervical spine and predominantly periventricular foci on brain MRI.   On 02/20/2013, she had the sudden onset of vertigo that was not altered by movements. Her gait worsened with ataxia and she felt like she was being pushed to the right as she walked. She did not note any numbness or weakness or visual changes. An MRI of the brain on 02/25/2013 showed multiple periventricular white matter lesions consistent with MS. She received 3 days of IV steroids with benefit. I personally reviewed the MRI and felt that there were 3 new lesions in the left frontal parietal out of that were not present on her prior MRI dated 08/08/2010 the foci enhanced. Due to the exacerbation and the MRI changes, she switched to Aubagio in 10-Nov-2012. She  tolerates it well and liver function tests have been fine.   REVIEW OF SYSTEMS:  Constitutional: No fevers, chills, sweats, or change in appetite Eyes: No visual changes, double vision, eye pain Ear, nose and throat: No hearing loss, ear pain, nasal congestion, sore throat Cardiovascular: No chest pain, palpitations Respiratory:  No  shortness of breath at rest or with exertion.   No wheezes GastrointestinaI: No nausea, vomiting, diarrhea, abdominal pain, fecal incontinence Genitourinary:  No dysuria, urinary retention or frequency.  No nocturia. Musculoskeletal:  No neck pain, back pain Integumentary: No rash, pruritus, skin lesions Neurological: as above Psychiatric: No depression at this time.  No anxiety Endocrine: No palpitations, diaphoresis, change in appetite, change in weigh or increased thirst.   She has felt cold since thyroid med's changed Hematologic/Lymphatic:  No anemia, purpura, petechiae. Allergic/Immunologic: No itchy/runny eyes, nasal congestion, recent allergic reactions, rashes  ALLERGIES: Allergies  Allergen Reactions  . Erythromycin Other (See Comments)    Abdominal cramping  . Propofol Other (See Comments)    Has trouble waking up  . Morphine And Related     Patient described heaviness and loss of feeling in legs coupled with a squeezing feeling in the epigastric region. Requested no further Morphine doses.       HOME MEDICATIONS: Outpatient Medications Prior to Visit  Medication Sig Dispense Refill  . aspirin 81 MG tablet Take 81 mg by mouth daily.     . brimonidine (ALPHAGAN) 0.2 % ophthalmic solution Place 1 drop into the right eye 2 (two) times daily. 5 mL 12  . Cholecalciferol (VITAMIN D-3) 1000 units CAPS Take 2,000 tablets by mouth daily.     Marland Kitchen doxycycline (VIBRA-TABS) 100 MG tablet Take 100 mg by mouth 2 (two) times daily.    Marland Kitchen FLUoxetine (PROZAC) 20 MG capsule Take 1 capsule (20 mg total) by mouth daily. 90 capsule 4  . lansoprazole (PREVACID) 30  MG capsule Take 30 mg by mouth daily.     Marland Kitchen levothyroxine (SYNTHROID, LEVOTHROID) 112 MCG tablet Take 112 mcg by mouth daily.      . potassium chloride SA (K-DUR,KLOR-CON) 20 MEQ tablet Take 20 mEq by mouth 2 (two) times daily.    . pravastatin (PRAVACHOL) 20 MG tablet Take 20 mg by mouth daily.     . prednisoLONE acetate (PRED FORTE) 1 % ophthalmic suspension Place 1 drop into the right eye 4 (four) times daily. 5 mL 0  . Probiotic Product (PROBIOTIC PO) Take 1 capsule by mouth daily.    . Teriflunomide (AUBAGIO) 14 MG TABS Take 14 mg by mouth daily. 90 tablet 3  . vitamin E 400 UNIT capsule Take 400 Units by mouth daily.      . diazepam (VALIUM) 5 MG tablet One or two po twice daily (Patient taking differently: Take 5 mg by mouth 2 (two) times daily as needed (Meniere's Disease). ) 60 tablet 5  . triamterene-hydrochlorothiazide (DYAZIDE) 37.5-25 MG capsule Take 1 each (1 capsule total) by mouth daily. 90 capsule 1  . zolpidem (AMBIEN) 10 MG tablet Take 1 tablet (10 mg total) by mouth at bedtime. 90 tablet 1  . bacitracin-polymyxin b (POLYSPORIN) ophthalmic ointment Place 1 application into the right eye 3 (three) times daily. apply to eye every 12 hours while awake 3.5 g 0  . bimatoprost (LATISSE) 0.03 % ophthalmic solution Place 1 drop into both eyes at bedtime. Place one drop on applicator and apply evenly along the skin of the upper eyelid at base of eyelashes once daily at bedtime; repeat procedure for second eye (use a clean applicator).    Marland Kitchen gatifloxacin (ZYMAXID) 0.5 % SOLN Place 1 drop into the right eye 4 (four) times daily.     No facility-administered medications prior to visit.     PAST MEDICAL HISTORY: Past Medical History:  Diagnosis  Date  . Anxiety   . Celiac disease   . Cervical dysplasia   . Claustrophobia   . Complication of anesthesia    slow to awaken, doesn't want Propofol- when she had it before, had to be reminded to breath.  . Depression   . Endometriosis   .  Family history of adverse reaction to anesthesia    mother difficulty intubation  . GERD (gastroesophageal reflux disease)   . Hearing loss   . High cholesterol   . Hypothyroidism   . Multiple sclerosis ([place])   . PONV (postoperative nausea and vomiting)   . Vision abnormalities     PAST SURGICAL HISTORY: Past Surgical History:  Procedure Laterality Date  . [place] VITRECTOMY WITH 20 GAUGE MVR PORT FOR MACULAR HOLE Right 05/06/2018  . 25 GAUGE PARS PLANA VITRECTOMY WITH 20 GAUGE MVR PORT FOR MACULAR HOLE Right 05/06/2018   Procedure: 25 GAUGE PARS PLANA VITRECTOMY WITH 20 GAUGE MVR PORT FOR MACULAR HOLE;  Surgeon: Hayden Pedro, MD;  Location: [facility];  Service: Ophthalmology;  Laterality: Right;  . ABDOMINAL HYSTERECTOMY  1999   TAH,BSO menorrhgia,,endometriosis  . CHOLECYSTECTOMY    . COLONOSCOPY    . COLPOSCOPY    . FINGER SURGERY Right    5 finger   . GAS/FLUID EXCHANGE Right 05/06/2018   Procedure: GAS/FLUID EXCHANGE C3F8;  Surgeon: Hayden Pedro, MD;  Location: [facility];  Service: Ophthalmology;  Laterality: Right;  . GYNECOLOGIC CRYOSURGERY  1987  . LAPAROSCOPY    . MEMBRANE PEEL Right 05/06/2018   Procedure: MEMBRANE PEEL;  Surgeon: Hayden Pedro, MD;  Location: [facility];  Service: Ophthalmology;  Laterality: Right;  . OOPHORECTOMY    . PHOTOCOAGULATION WITH LASER Right 05/06/2018   Procedure: PHOTOCOAGULATION WITH LASER;  Surgeon: Hayden Pedro, MD;  Location: [facility];  Service: Ophthalmology;  Laterality: Right;  . ROTATOR CUFF REPAIR Right   . VITRECTOMY      FAMILY HISTORY: Family History  Problem Relation Age of Onset  . Breast cancer Mother        age 32's  . Cancer Mother        UTERINE  . Colon cancer Mother   . Diabetes Father   . Hypertension Father   . Heart disease Father   . Cancer Maternal Grandfather        Stomach cancer  . Heart disease Paternal Grandfather     SOCIAL HISTORY:  Social History   Socioeconomic History  .  Marital status: Married    Spouse name: Not on file  . Number of children: Not on file  . Years of education: Not on file  . Highest education level: Not on file  Occupational History  . Not on file  Social Needs  . Financial resource strain: Not on file  . Food insecurity:    Worry: Not on file    Inability: Not on file  . Transportation needs:    Medical: Not on file    Non-medical: Not on file  Tobacco Use  . Smoking status: Former Smoker    Years: 15.00  . Smokeless tobacco: Never Used  . Tobacco comment: 05/05/18- quit at least 15 years ago  Substance and Sexual Activity  . Alcohol use: No  . Drug use: No  . Sexual activity: Not on file  Lifestyle  . Physical activity:    Days per week: Not on file    Minutes per session: Not on file  .  Stress: Not on file  Relationships  . Social connections:    Talks on phone: Not on file    Gets together: Not on file    Attends religious service: Not on file    Active member of club or organization: Not on file    Attends meetings of clubs or organizations: Not on file    Relationship status: Not on file  . Intimate partner violence:    Fear of current or ex partner: Not on file    Emotionally abused: Not on file    Physically abused: Not on file    Forced sexual activity: Not on file  Other Topics Concern  . Not on file  Social History Narrative  . Not on file     PHYSICAL EXAM  Vitals:   08/15/18 0929  BP: 136/77  Pulse: 80    There is no height or weight on file to calculate BMI.   General: The patient is well-developed and well-nourished and in no acute distress  Neck:.  The neck is nontender with good ROM   Neurologic Exam  Mental status: The patient is alert and oriented x 3 at the time of the examination. The patient has apparent normal recent and remote memory, with an apparently normal attention span and concentration ability.   Speech is normal.  Cranial nerves: Extraocular movements are full.   Facial strength and sensation is normal. Trapezius strength is normal.. No dysarthria is noted.  The tongue is midline, and the patient has symmetric elevation of the soft palate. No obvious hearing deficits are noted.  Motor: Muscle bulk is normal.  Muscle tone is increased.  Strength is 5/5.  Sensory: She has intact touch and vibration sensation in the arms and legs.  Coordination: Good finger-nose-finger and heel-to-shin.  Gait and station: The station is normal.  Gait is near normal but the tandem gait is wide.  Romberg is negative.  Reflexes: Deep tendon reflexes are symmetric and normal bilaterally.      DIAGNOSTIC DATA (LABS, IMAGING, TESTING) - I reviewed patient records, labs, notes, testing and imaging myself where available.  Lab Results  Component Value Date   WBC 4.2 05/06/2018   HGB 12.6 05/06/2018   HCT 40.0 05/06/2018   MCV 89.7 05/06/2018   PLT 239 05/06/2018      Component Value Date/Time   NA 139 05/06/2018 0945   NA 135 09/19/2016 1358   K 3.4 (L) 05/06/2018 0945   CL 104 05/06/2018 0945   CO2 25 05/06/2018 0945   GLUCOSE 94 05/06/2018 0945   BUN 17 05/06/2018 0945   BUN 15 09/19/2016 1358   CREATININE 0.81 05/06/2018 0945   CALCIUM 8.7 (L) 05/06/2018 0945   PROT 6.8 09/19/2016 1358   ALBUMIN 4.2 09/19/2016 1358   AST 17 09/19/2016 1358   ALT 20 09/19/2016 1358   ALKPHOS 67 09/19/2016 1358   BILITOT 0.4 09/19/2016 1358   GFRNONAA >60 05/06/2018 0945   GFRAA >60 05/06/2018 0945        ASSESSMENT AND PLAN  Multiple sclerosis ([place]) - Plan: CBC with Differential/Platelet, Comprehensive metabolic panel  Meniere's disease, unspecified laterality  Vertigo  Ataxic gait  Insomnia, unspecified type - Plan: Home sleep test  Depression, unspecified depression type  OSA (obstructive sleep apnea) - Plan: Home sleep test    1.  She will continue Aubagio 14 mg every day.  We will check lab work.   2.  Continue dyazide and prn valium for her  Mnire's disease.   If she does get a severe episode, she can take up to 4 Valiums that day (she also takes 1 Valium every morning for anxiety).   Potassium has been low and she should continue supplementation.  We will recheck potassium level today. 3.  Ambien for sleep.    She describes obstructive sleep apnea we will check a home sleep study to further evaluate this. 4.  Advised to stay active and exercises as tolerated 5.   She will return to see me in about 6  months, sooner if she has new or worsening neurologic symptoms.    Kestrel Mis A. Felecia Shelling, MD, PhD 63/87/5643, 32:95 PM Certified in Neurology, Clinical Neurophysiology, Sleep Medicine, Pain Medicine and Neuroimaging  [facility] [address], [place], [place] [address] [phone]

## 2018-08-16 LAB — COMPREHENSIVE METABOLIC PANEL
ALK PHOS: 69 IU/L (ref 39–117)
ALT: 18 IU/L (ref 0–32)
AST: 17 IU/L (ref 0–40)
Albumin/Globulin Ratio: 2 (ref 1.2–2.2)
Albumin: 4.4 g/dL (ref 3.6–4.8)
BUN/Creatinine Ratio: 20 (ref 12–28)
BUN: 17 mg/dL (ref 8–27)
Bilirubin Total: 0.3 mg/dL (ref 0.0–1.2)
CALCIUM: 9.5 mg/dL (ref 8.7–10.3)
CO2: 24 mmol/L (ref 20–29)
CREATININE: 0.84 mg/dL (ref 0.57–1.00)
Chloride: 98 mmol/L (ref 96–106)
GFR calc Af Amer: 83 mL/min/{1.73_m2} (ref >59)
GFR, EST NON AFRICAN AMERICAN: 72 mL/min/{1.73_m2} (ref >59)
Globulin, Total: 2.2 g/dL (ref 1.5–4.5)
Glucose: 92 mg/dL (ref 65–99)
POTASSIUM: 3.9 mmol/L (ref 3.5–5.2)
Sodium: 138 mmol/L (ref 134–144)
Total Protein: 6.6 g/dL (ref 6.0–8.5)

## 2018-08-16 LAB — CBC WITH DIFFERENTIAL/PLATELET
BASOS ABS: 0.1 10*3/uL (ref 0.0–0.2)
Basos: 1 %
EOS (ABSOLUTE): 0.1 10*3/uL (ref 0.0–0.4)
Eos: 3 %
Hematocrit: 43.3 % (ref 34.0–46.6)
Hemoglobin: 14.4 g/dL (ref 11.1–15.9)
IMMATURE GRANULOCYTES: 0 %
Immature Grans (Abs): 0 10*3/uL (ref 0.0–0.1)
Lymphocytes Absolute: 1.2 10*3/uL (ref 0.7–3.1)
Lymphs: 29 %
MCH: 28.4 pg (ref 26.6–33.0)
MCHC: 33.3 g/dL (ref 31.5–35.7)
MCV: 85 fL (ref 79–97)
MONOS ABS: 0.5 10*3/uL (ref 0.1–0.9)
Monocytes: 12 %
NEUTROS PCT: 55 %
Neutrophils Absolute: 2.3 10*3/uL (ref 1.4–7.0)
PLATELETS: 285 10*3/uL (ref 150–450)
RBC: 5.07 x10E6/uL (ref 3.77–5.28)
RDW: 13 % (ref 12.3–15.4)
WBC: 4.2 10*3/uL (ref 3.4–10.8)

## 2018-08-18 ENCOUNTER — Telehealth: Payer: Self-pay | Admitting: *Deleted

## 2018-08-18 NOTE — Telephone Encounter (Signed)
Called and spoke with pt about lab results per Dr. Felecia Shelling note. She verbalized understanding.

## 2018-08-18 NOTE — Telephone Encounter (Signed)
-----   Message from Britt Bottom, MD sent at 08/16/2018  2:41 PM EST ----- Please let the patient know that the lab work is fine.

## 2018-09-10 ENCOUNTER — Ambulatory Visit (INDEPENDENT_AMBULATORY_CARE_PROVIDER_SITE_OTHER): Payer: Medicare Other | Admitting: Neurology

## 2018-09-10 DIAGNOSIS — G4733 Obstructive sleep apnea (adult) (pediatric): Secondary | ICD-10-CM | POA: Diagnosis not present

## 2018-09-10 DIAGNOSIS — G4700 Insomnia, unspecified: Secondary | ICD-10-CM

## 2018-09-15 ENCOUNTER — Other Ambulatory Visit: Payer: Self-pay | Admitting: Neurology

## 2018-09-15 DIAGNOSIS — G4733 Obstructive sleep apnea (adult) (pediatric): Secondary | ICD-10-CM

## 2018-09-15 NOTE — Progress Notes (Signed)
[facility] [place], [place] [address] NAME: Leslie Ryan                                                                           DOB: [birth year] MEDICAL RECORD no:  [id number]                                                       DOS: 09/10/2018  REFERRING PHYSICIAN: Burnard Bunting, MD STUDY PERFORMED: Home Sleep Test on Watch Pat HISTORY: HISTORY OF PRESENT ILLNESS:  Leslie Ryan is a 68 year old woman with multiple sclerosis and witnessed OSA.      Update 08/15/2018: She feels her MS is stable and she has no new symptoms.   She tolerates Aubagio well and labwork has been fine. She repots having labwork with her PCP over the summer and everything was fine.       Gait, strength are ok.  She has numbness in her fingers bilaterally and sometimes in her feet.   She gets some muscle cramps.   She has a hard time sleeping.    Hr spasticity and cramps seem worse at night.   She has probable OSA waking up with a snort many nights.   She has some daytime sleepiness but it is not severe.   EPWORTH SLEEPINESS SCALE   On a scale of 0 - 3 what is the chance of dozing:   Sitting and Reading:                                 3 Watching TV:                                            2 Sitting inactive in a public place:              0 Passenger in car for one hour:                 0 Lying down to rest in the afternoon:         2 Sitting and talking to someone:                0 Sitting quietly after lunch:                         0 In a car, stopped in traffic:                        0   Total (out of 24):     7/24   Normal sleepiness      STUDY RESULTS:   Total Recording Time: 10 hrs, 4 mins; Total Sleep Time:   8 hrs,50 mins Total Apnea/Hypopnea Index (AHI):  52.2 /h; RDI:  52.8/h; REM AHI:  40.3/h Average  Oxygen Saturation:   91 %; Lowest Oxygen Desaturation:  81 %  Total Time Oxygen Saturation Below or at 88 %: 28.4 minutes  Average Heart  Rate:   68 bpm   IMPRESSION:   Severe OSA with an AHI = 52.2   RECOMMENDATION:  Recommend CPAP titration with followup with Dr. Felecia Shelling    I certify that I have reviewed the raw data recording prior to the issuance of this report in accordance with the standards of the American Academy of Sleep Medicine (AASM).   Katera Rybka A. Felecia Shelling, MD, PhD, FAAN Certified in Neurology, Clinical Neurophysiology, Sleep Medicine, Pain Medicine and Neuroimaging Director, [place] [facility] [address], [place], [place] [address] [phone]

## 2018-09-15 NOTE — Progress Notes (Signed)
Please let her know the home sleep study did show that she had severe sleep apnea and we will be setting up a CPAP titration.  I ready sent in the order to the sleep lab.

## 2018-09-16 ENCOUNTER — Telehealth: Payer: Self-pay | Admitting: *Deleted

## 2018-09-16 ENCOUNTER — Ambulatory Visit (INDEPENDENT_AMBULATORY_CARE_PROVIDER_SITE_OTHER): Payer: Medicare Other | Admitting: Psychology

## 2018-09-16 DIAGNOSIS — F331 Major depressive disorder, recurrent, moderate: Secondary | ICD-10-CM

## 2018-09-16 NOTE — Telephone Encounter (Signed)
Spoke with Leslie Ryan this am and per RAS, advised PSG showed severe OSA. She will need a CPAP titration, which he has ordered. She verbalized understanding of same/fim

## 2018-09-21 ENCOUNTER — Ambulatory Visit (INDEPENDENT_AMBULATORY_CARE_PROVIDER_SITE_OTHER): Payer: Medicare Other | Admitting: Neurology

## 2018-09-21 DIAGNOSIS — G4733 Obstructive sleep apnea (adult) (pediatric): Secondary | ICD-10-CM

## 2018-09-23 NOTE — Progress Notes (Signed)
PATIENT'S NAME:  Leslie Ryan, Leslie Ryan DOB:      1950/10/08      MR#:    [id number]     DATE OF RECORDING: 09/21/2018 REFERRING M.D.:  Burnard Bunting, MD Study Performed:   CPAP  Titration HISTORY:  Patient returning for a CPAP Titration sleep study, after having an HST on 09/10/18. Patient had an AHI of 52.2, and an oxygen nadir of 81%.  The patient endorsed the Epworth Sleepiness Scale at 7/24 points.    The patient's weight 174 pounds with a height of 63 (inches), resulting in a BMI of 30.9 kg/m2.  The patient's neck circumference measured 17 inches.  CURRENT MEDICATIONS: Aspirin, Alphagan, Vitamin D3, Vibra-tabs, Prozac, Prevacid, Synthroid, Klor-con, Probiotics, Aubagio, Valium, Dyazide, Ambien, Polysporin, Latisse, Zymaxid.    PROCEDURE:  This is a multichannel digital polysomnogram utilizing the SomnoStar 11.2 system.  Electrodes and sensors were applied and monitored per AASM Specifications.   EEG, EOG, Chin and Limb EMG, were sampled at 200 Hz.  ECG, Snore and Nasal Pressure, Thermal Airflow, Respiratory Effort, CPAP Flow and Pressure, Oximetry was sampled at 50 Hz. Digital video and audio were recorded.       CPAP was initiated at 5 cmH20 with heated humidity per AASM split night standards and pressure was advanced to 8/cmH20 because of hypopneas, apneas and desaturations.  At a PAP pressure of 10 cmH20, there was a reduction of the AHI to 0.8 with improvement of sleep apnea.  Lights Out was at 21:44 and Lights On at 05:00. Total recording time (TRT) was 436 minutes, with a total sleep time (TST) of 300 minutes. The patient's sleep latency was 49 minutes. REM latency was 198.5 minutes.  The sleep efficiency was 68.8 %.    SLEEP ARCHITECTURE: WASO (Wake after sleep onset)  was 97 minutes.  There were 11 minutes in Stage N1, 217 minutes Stage N2, 57.5 minutes Stage N3 and 14.5 minutes in Stage REM.  The percentage of Stage N1 was 3.7%, Stage N2 was 72.3%, Stage N3 was 19.2% and Stage R  (REM sleep) was 4.8%.  The arousals were noted as: 96 were spontaneous, 6 were associated with PLMs, 9 were associated with respiratory events.   RESPIRATORY ANALYSIS:  There was a total of 31 respiratory events: 4 obstructive apneas, 3 central apneas and 0 mixed apneas with a total of 7 apneas and an apnea index (AI) of 1.4 /hour. There were 24 hypopneas with a hypopnea index of 4.8/hour. The patient also had 0 respiratory event related arousals (RERAs).      The total APNEA/HYPOPNEA INDEX  (AHI) was 6.2 /hour and the total RESPIRATORY DISTURBANCE INDEX was 6.2 /hour  4 events occurred in REM sleep and 27 events in NREM. The REM AHI was 16.6 /hour versus a non-REM AHI of 5.7 /hour.  The patient spent 198.5 minutes of total sleep time in the supine position and 102 minutes in non-supine. The supine AHI was 7.8, versus a non-supine AHI of 3.0.  At a final pressure of + 10 cm H20, the AHI was zero.    OXYGEN SATURATION & C02:  The baseline 02 saturation was 92%, with the lowest being 85%. Time spent below 89% saturation equaled 5 minutes.  PERIODIC LIMB MOVEMENTS:  The patient had a total of 13 Periodic Limb Movements. The Periodic Limb Movement (PLM) index was 2.6 and the PLM Arousal index was 1.2 /hour.  Audio and video analysis did not show any abnormal or unusual movements, behaviors,  phonations or vocalizations.   EKG showed normal sinus rhythm (NSR).  Post-study, the patient indicated that sleep was the same as usual.   The patient was fitted with a ResMed N-20 Medium mask.  DIAGNOSIS 1. OSA 2. Successful CPAP titration to +10 cm H20    PLANS/RECOMMENDATIONS:  1. CPAP  +10 cm H20 heated humidifier.  The mask used was a ResMed N20 Medium mask.  2. Download in 30 and 90 days 3. Follow up with Dr. Felecia Shelling in 3 months, sooner if problems   A follow up appointment will be scheduled in the Sleep Clinic at [facility].   Please call [phone] with any questions.       I certify that I have reviewed the entire raw data recording prior to the issuance of this report in accordance with the Standards of Accreditation of the [place] Academy of Sleep Medicine (AASM)   Mccormick Macon A. Felecia Shelling, MD, PhD, FAAN Certified in Neurology, Clinical Neurophysiology, Sleep Medicine, and Neuroimaging Director, [place] [facility] [address], [place], [place] [address] [phone]     Demographics and Medical History           Name: Leslie Ryan, Leslie Ryan Age: 68 BMI: 30.9 Interp Physician: Arlice Colt, MD  DOB: [birth year] Ht-IN: 63 CM: 160 Referred By: Burnard Bunting, MD  Pt. Tag:  Wt-LB: 174 KG: 79 Tested By: Fredirick Maudlin, RPSGT  Pt. #: [id number] Sex: Female Scored By: Fredirick Maudlin, RPSGT  Bed Tag: [id number] Race: Caucasian    Sleep Summary    Sleep Time Statistics Minutes Hours    Time in Bed 436    7.3    Total Sleep Time 300    5.0    Total Sleep Time NREM 285.5    4.8    Total Sleep Time REM 14.5    0.2    Sleep Onset 38.5    0.6    Wake After Sleep Onset 97    1.6    Wake After Sleep Period 0.5    0.0    Latency Persistent Sleep 49    0.8    Sleep Efficiency 68.8 Percent    Lights out 21:44     Lights on 05:00    Sleep Disruption Events Count Index    Arousals 121 24.2    Awakenings 0 0    Arousals + Awakenings 121 24.2    REM Awakenings 0 0.0     Sleep Stage Statistics Wake N1 N2 N3 REM    Percent Stage to SPT 24.4  2.8  54.7  14.5  3.7  Percent   Sleep Period Time in Stage 97 11 217 57.5 14.5 Minutes   Latency to Stage  38.5 42 26.5 198.5 Minutes   Percent Stage to TST  3.7 72.3 19.2 4.8 Percent   EKG Summary          EKG Statistics         Heart Rate, Wake 75 BPM  TST Epochs in HR Interval 0 < 29   Heart Rate, Steady Sleep Avg 64 BPM   36 30-59   PAC Events 0 Count   560 60-79   PVC Events 0 Count   4 80-99   Bradycardia 0 Count    0 100-119   Tachycardia 0 Count   0 120-139        0 140-159    NREM REM   0 > 160  Shortest R-R .8 .7       Longest R-R 1.1 1.        Respiration Summary  Event Statistics Total  With Arousal  With Awakening    Count Index  Count Index  Count Index   Apneas, Total 7 1.4  3 0.6   0 0.0    Hypopneas, Total 24 4.8  6 1.2   0 0.0    Events (Apneas + Hypopneas) 31 6.2   9 1.8   0 0.0    Apneas, Supine 5 1.5     Apneas, Non Supine 2 1.2     Hypopneas, Supine 21 6.3     Hypopneas, Non Supine 3 1.8     % Sleep Apnea .4 Percent     % Sleep Hypopnea 1.3 Percent    Oximetry Statistics       SpO2, Mean Wake 92 Percent     SpO2, Minimum 85 Percent     SpO2, Max 95 Percent     SpO2, Mean 92 Percent            Desaturation Index, REM 16.6  Index     Desaturation Index, NREM 4.4  Index     Desaturation Index, Total 5.0  Index            SpO2 Intervals > 89% 80-89% 70-79% 60-69% 50-59% 40-49% 30-39% < 30%  300 Percent Sleep Time 98 2 0 0 0 0 0 0  Body Position Statistics   Back Side L Side R Side Prone    Total Sleep Time   198.5 101.5 78 23.5 0 Minutes   Percent Time to TST   66.2  33.8  26.0  7.8  0.0  Percent   Number of Events   26 5.0 2 3 0 Count   Number of Apneas   5 2 0 2 0 Count   Number of Hypopneas   21 3 2 1  0 Count   Apnea Index   1.5  1.2  0.0  5.1  0.0  Index   Hypopnea Index   6.3  1.8  1.5  2.6  0.0  Index   Apnea + Hypopnea Index   7.9  3.0  1.5  7.7  0.0  Index    Non REM, Post Rx Statistics Non Supine  Supine    Central Mixed Obstr  Central Mixed Obstr   Apneas 2 0 0  1 0 3 Count  Apneas, Minimum SpO2 93 0 0  94 0 87 Percent     Hypopneas 0 0 3  0 0 18 Count  Hypopneas, Minimum SpO2 0 0 90  0 0 87 Percent     Apnea + Hypoapnea Index 1.2 0.0 1.8  0.3 0.0 6.9 Index    REM, Post Rx Statistics Non Supine  Supine    Central Mixed Obstr  Central Mixed Obstr   Apneas 0 0 0  0 0 1 Count  Apneas, Minimum SpO2 0 0 0  0 0 86 Percent      Hypopneas 0 0 0  0 0 3 Count  Hypopneas, Minimum SpO2 0 0 0  0 0 85 Percent     Apnea + Hypopnea Index 0.0 0.0 0.0  0.0 0.0 16.5 Index   Leg Movement Summary    PLM Non REM (Incl. Wake) REM Total    No Arousal Arousal Wake No Arousal Arousal Wake No Arousal Arousal Wake Total   Isolated 6 8 0 0  2 0 6 10 0 16   PLMS 7 6 0 0 0 0 7 6 0 13    Total 13 14 0 0 2 0 13 16 0 29   PLM Statistics PLMS Total     Count Index Count Index    PLM 13 2.6 29 5.8     PLM with Arousal 6 1.2 16 3.2     PLM, with Wake 0 0 0 0.0     PLM, Arousal + Wake 6 1.2 16 3.2     PLM, No Arousal 7 1.4  13 2.6     PLM, Non REM 13 2.7  27 5.7     PLM, REM 0 0.0  2 8.3     Technician Comments:  Patient came in for a CPAP Titration sleep study. Patient initiated CPAP on 5cm/h2o and reached a CPAP pressure of 10cm/h2o. Patient slept on back and sides. Sleep was observed in N1, N2, N3, and REM. EKG NSR. Patient used a ResMed N-20 nasal medium.

## 2018-09-24 ENCOUNTER — Telehealth: Payer: Self-pay | Admitting: *Deleted

## 2018-09-24 DIAGNOSIS — G4733 Obstructive sleep apnea (adult) (pediatric): Secondary | ICD-10-CM

## 2018-09-24 NOTE — Telephone Encounter (Signed)
I called and spoke with pt about CPAP titration results. Advised Dr. Felecia Shelling would like her to get started on CPAP. She did not have preference on DME company. Advised we will send to [facility]. She should try and wear CPAP for 4 hours or more each night to be compliant. She should bring CPAP to her initial f/u on 01/06/19 at 3:30pm with Dr. Felecia Shelling. She verbalized. I asked her to call me if she does not hear from DME company within the next week to get set up. I cx appt that she had 02/2019 since she is being seen sooner. Pt aware.  I faxed orders to [facility] at [phone]. Received fax confirmation.

## 2018-09-30 ENCOUNTER — Ambulatory Visit (INDEPENDENT_AMBULATORY_CARE_PROVIDER_SITE_OTHER): Payer: Medicare Other | Admitting: Psychology

## 2018-09-30 DIAGNOSIS — F331 Major depressive disorder, recurrent, moderate: Secondary | ICD-10-CM | POA: Diagnosis not present

## 2018-10-14 ENCOUNTER — Ambulatory Visit (INDEPENDENT_AMBULATORY_CARE_PROVIDER_SITE_OTHER): Payer: Medicare Other | Admitting: Psychology

## 2018-10-14 DIAGNOSIS — F411 Generalized anxiety disorder: Secondary | ICD-10-CM

## 2018-10-28 ENCOUNTER — Ambulatory Visit (INDEPENDENT_AMBULATORY_CARE_PROVIDER_SITE_OTHER): Payer: Medicare Other | Admitting: Psychology

## 2018-10-28 DIAGNOSIS — F331 Major depressive disorder, recurrent, moderate: Secondary | ICD-10-CM

## 2018-10-31 DIAGNOSIS — R0981 Nasal congestion: Secondary | ICD-10-CM | POA: Diagnosis not present

## 2018-11-11 ENCOUNTER — Ambulatory Visit: Payer: Medicare Other | Admitting: Psychology

## 2018-11-21 DIAGNOSIS — L718 Other rosacea: Secondary | ICD-10-CM | POA: Diagnosis not present

## 2018-11-25 ENCOUNTER — Ambulatory Visit (INDEPENDENT_AMBULATORY_CARE_PROVIDER_SITE_OTHER): Payer: Medicare Other | Admitting: Psychology

## 2018-11-25 DIAGNOSIS — F331 Major depressive disorder, recurrent, moderate: Secondary | ICD-10-CM | POA: Diagnosis not present

## 2018-12-09 ENCOUNTER — Ambulatory Visit (INDEPENDENT_AMBULATORY_CARE_PROVIDER_SITE_OTHER): Payer: Medicare Other | Admitting: Psychology

## 2018-12-09 DIAGNOSIS — F331 Major depressive disorder, recurrent, moderate: Secondary | ICD-10-CM | POA: Diagnosis not present

## 2018-12-23 ENCOUNTER — Ambulatory Visit (INDEPENDENT_AMBULATORY_CARE_PROVIDER_SITE_OTHER): Payer: Medicare Other | Admitting: Psychology

## 2018-12-23 DIAGNOSIS — F331 Major depressive disorder, recurrent, moderate: Secondary | ICD-10-CM

## 2018-12-29 ENCOUNTER — Telehealth: Payer: Self-pay | Admitting: Neurology

## 2018-12-29 NOTE — Telephone Encounter (Signed)
12/29/18 Spoke w/ patient and set up virtual visit

## 2018-12-29 NOTE — Telephone Encounter (Signed)
Leslie Ryan- can you get her set up for [email or web address]? Thank you

## 2018-12-29 NOTE — Telephone Encounter (Signed)
Pt consented to a Virtual Visit and for the insurance to be billed as such. Pt's phone carrier is [employer]. Pt also states that she would like to know her blood type either by looking in her chart or if she has to go in to do labs. Please advise.

## 2018-12-29 NOTE — Telephone Encounter (Signed)
Called pt. Updated med list/pharmacy/allergies on file. Advised I was able to download CPAP compliance report and will provide to Dr. Felecia Shelling.  She asked if I could see what blood type she is. Advised I do not see anything in epic. Dr. Felecia Shelling does not typically order this test. She verbalized understanding and will discuss with Dr. Felecia Shelling tomorrow.

## 2018-12-30 ENCOUNTER — Ambulatory Visit (INDEPENDENT_AMBULATORY_CARE_PROVIDER_SITE_OTHER): Payer: Medicare Other | Admitting: Neurology

## 2018-12-30 ENCOUNTER — Telehealth: Payer: Self-pay | Admitting: *Deleted

## 2018-12-30 ENCOUNTER — Encounter: Payer: Self-pay | Admitting: Neurology

## 2018-12-30 ENCOUNTER — Other Ambulatory Visit: Payer: Self-pay

## 2018-12-30 DIAGNOSIS — G4700 Insomnia, unspecified: Secondary | ICD-10-CM | POA: Diagnosis not present

## 2018-12-30 DIAGNOSIS — R5382 Chronic fatigue, unspecified: Secondary | ICD-10-CM | POA: Diagnosis not present

## 2018-12-30 DIAGNOSIS — R260 Ataxic gait: Secondary | ICD-10-CM | POA: Diagnosis not present

## 2018-12-30 DIAGNOSIS — G35 Multiple sclerosis: Secondary | ICD-10-CM

## 2018-12-30 DIAGNOSIS — G4733 Obstructive sleep apnea (adult) (pediatric): Secondary | ICD-10-CM | POA: Diagnosis not present

## 2018-12-30 MED ORDER — TRIAMTERENE-HCTZ 37.5-25 MG PO CAPS: 1.0000 | ORAL_CAPSULE | Freq: Every day | ORAL | 3 refills | Status: DC

## 2018-12-30 MED ORDER — ZOLPIDEM TARTRATE 10 MG PO TABS: 10.0000 mg | ORAL_TABLET | Freq: Every day | ORAL | 1 refills | Status: DC

## 2018-12-30 MED ORDER — DIAZEPAM 5 MG PO TABS: ORAL_TABLET | ORAL | 1 refills | Status: DC

## 2018-12-30 MED ORDER — TERIFLUNOMIDE 14 MG PO TABS: 14.0000 mg | ORAL_TABLET | Freq: Every day | ORAL | 3 refills | Status: DC

## 2018-12-30 MED ORDER — FLUOXETINE HCL 20 MG PO CAPS: 20.0000 mg | ORAL_CAPSULE | Freq: Every day | ORAL | 4 refills | Status: DC

## 2018-12-30 MED ORDER — ARMODAFINIL 200 MG PO TABS: ORAL_TABLET | ORAL | 1 refills | Status: DC

## 2018-12-30 NOTE — Telephone Encounter (Signed)
Faxed printed/signed rx's for diazepam, ambien, armodafinil to [facility] at [phone]. Received fax confirmation.

## 2018-12-30 NOTE — Progress Notes (Signed)
[facility]  PATIENT: Leslie Ryan DOB: [birth year]  REFERRING CLINICIAN: Burnard Bunting HISTORY FROM: patient REASON FOR VISIT: MS   HISTORICAL  CHIEF COMPLAINT:  Chief Complaint  Patient presents with  . Multiple Sclerosis    HISTORY OF PRESENT ILLNESS:  Leslie Ryan is a 68 year old woman with multiple sclerosis.    Update 12/30/2018: Virtual Visit via Video Note I connected with Leslie Ryan on 12/31/18 at  2:30 PM EDT by a video enabled telemedicine application and verified that I am speaking with the correct person.  I discussed the limitations of evaluation and management by telemedicine and the availability of in person appointments. The patient expressed understanding and agreed to proceed.  History of Present Illness: She feels her MS has been mostly stable.  She has no recent exacerbations and no significant new symptoms.  She is on Aubagio.  She tolerates it well.  She feels gait is doing the same with reduced balance but no falls.   She notes mild leg weakness .  Mild hand and foot numbness.   Sometimes she notes mild diplopia.   Bladder function is fine.  She has occasional severe episodes of dizziness, less now than in the past.  She was diagnosed with Mnire's.  HCTZ appears to help.  She has had poor sleep, daytime somnolence, fatigue and OSA signs.  At her last visit she was scheduled for a PSG.  It showed severe sleep apnea with an AHI equals 55.  On 05/22/2019 she had a CPAP titration and was titrated successfully to +10 cm.   She uses CPAP nightly and download shows great compliance (97%) and efficacy (AHI=3.3)  Mood is doing well.   She recently started seeing psychiatry.    EPWORTH SLEEPINESS SCALE  On a scale of 0 - 3 what is the chance of dozing:  Sitting and Reading:  2   Watching TV:   1 Sitting inactive in a public place: 0 Passenger in car for one hour: 0 Lying down to rest in the afternoon: 2 Sitting and  talking to someone: 0 Sitting quietly after lunch:  0 In a car, stopped in traffic:  0  Total (out of 24):  5/24    Observations/Objective: She is a well-developed well-nourished woman in no acute distress.  The head is normocephalic and atraumatic.  Sclera are anicteric.  Visible skin appears normal.  The neck has a good range of motion.  Pharynx and tongue have normal appearance.  She is alert and fully oriented with fluent speech and good attention, knowledge and memory.  Extraocular muscles are intact.  Facial strength is normal.  Palatal elevation and tongue protrusion are midline.  She appears to have normal strength in the arms.  Rapid alternating movements and finger-nose-finger are performed well.   Assessment and Plan: Multiple sclerosis (HCC)  OSA (obstructive sleep apnea)  Chronic fatigue  Ataxic gait  Insomnia, unspecified type   1.   Continue Aubagio for MS. 2.   HCTZ and Valium as needed for episodes of vertigo. 3.   Continue armodafinil for fatigue and zolpidem for sleep. 4.   Continue CPAP for OSA.. 5.   Return in 6 months or sooner if there are new or worsening neurologic symptoms.  Follow Up Instructions: I discussed the assessment and treatment plan with the patient. The patient was provided an opportunity to ask questions and all were answered. The patient agreed with the plan and demonstrated an understanding of the instructions.  The patient was advised to call back or seek an in-person evaluation if the symptoms worsen or if the condition fails to improve as anticipated.  I provided 27 minutes of non-face-to-face time during this encounter.  __________________________ From previous visits Update 08/15/2018: She feels her MS is stable and she has no new symptoms.   She tolerates Aubagio well and labwork has been fine. She repots having labwork with her PCP over the summer and everything was fine.      Gait, strength are ok.  She has numbness in her  fingers bilaterally and sometimes in her feet.   She gets some muscle cramps.   She has a hard time sleeping.    Hr spasticity and cramps seem worse at night.   She has probable OSA waking up with a snort many nights.   She has some daytime sleepiness but it is not severe.  EPWORTH SLEEPINESS SCALE  On a scale of 0 - 3 what is the chance of dozing:  Sitting and Reading:   3 Watching TV:    2 Sitting inactive in a public place:  0 Passenger in car for one hour:  0 Lying down to rest in the afternoon: 2 Sitting and talking to someone:  0 Sitting quietly after lunch:  0 In a car, stopped in traffic:  0  Total (out of 24):     7/24   Normal sleepiness   She notes some depression.  Her husband is in a memory care unit now for vascular/Alzheimer dementia.     She had a vitrectomy in September 2019 and is going to have cataract surgery soon.     Update 11/15/2017: She feels her MS is stable but she is under a lot of stress with her husband having moderate AD. Mood is poor with dperession and irritability.         She wobbles and stumbles some with gait but falls are very rare.    Her gait issues have progressed gradually.   She feels the leg spasticity is helped by valium.  She feels fatigue is worse.   She is sleeping worse due to her husband's AD issues (he wanders and sometimes wakes up gets dressed and is ready for the day.  He has sundowning and Seroquel has not helped him much).     She has Meniere's and takes Dyazide if she gets any vertigo      From 03/27/2017: She feels her MS is mostly ok but family issues are adding stress.   Her husband has AD and she primarily cares for her granddaughter  MS:   She is on Aubagio since 2014.   She is tolerating it well. She gets labwork several times a year with Dr. Reynaldo Minium (PCP)  She has not had any exacerbation.  Gait/strength/sensatoin:  Gait is stable.   It is mildly spastic and ataxic.   She stumbles some but no falls.   She has mild leg  weakness and spasticity, slightly worse on her left.     She notes mild weakness in the legs. Diazepam has helped a little bit with this spasticity.  She is not exercising because she feels so weak afterwards.    Bladder/Bowel:  She denies any significant difficulty with her bladder. Bowel function is fine.   Vision:  Vision is doing ok.   No changes..   She has been diagnosed with macular degeneration  Fatigue/sleep:   She notes a lot of  fatigue, and doing  chores especially tires her out.       Fatigue is more physical than mental.  She has insomnia that is helped by Ambien. However, her RLS seems to be doing worse and she can't get comfortable some nights (1-2 times a month).     Sleep maintenance is more of a problem than sleep onset.   Once asleep, she usually gets at least 5 hours sleep but wakes up a few times some nights  Mood/Cognition:    She has had depression And notes more stress the last year. (mother died 10-25-2015 ago and husband has AD).   She is on fluoxetine and feels it does help some..  She also occasionally takes diazepam for anxiety.   She denies major cognitive issues but notes mild memory problems at times.    Meniere's:  She has had a couple episodes of Mnire's disease with severe vertigo and N/V.   The combination of the diazepam and Dyazide has greatly helped.      We dicussed taking an additional  valium on those rare days when she has mild vertigo and is concerned it will intensify.   .      MS History:  In the mid 1980s, she presented with right optic neuritis while working at [employer]. A lumbar puncture was inconclusive but an MRI of the brain was consistent with MS a few years later. In the 1980s and early 1990s, she received multiple rounds of IV steroids for occasional exacerbations involving vision or gait. In October 24, 1992, she started Betaseron stopped quickly due to severe injection site reactions. In January 1997, after an exacerbation she was started on Avonex and  continued on Avonex for the next 15 years or so. She had some issues with compliance on some occasions.   In the late 1990s, she began to have more permanent gait issues the right foot drop that slowly worsened. I saw early MRI reports from 10/24/98 showing 2 foci at C6 and C7 on the cervical spine and predominantly periventricular foci on brain MRI.   On 02/20/2013, she had the sudden onset of vertigo that was not altered by movements. Her gait worsened with ataxia and she felt like she was being pushed to the right as she walked. She did not note any numbness or weakness or visual changes. An MRI of the brain on 02/25/2013 showed multiple periventricular white matter lesions consistent with MS. She received 3 days of IV steroids with benefit. I personally reviewed the MRI and felt that there were 3 new lesions in the left frontal parietal out of that were not present on her prior MRI dated 08/08/2010 the foci enhanced. Due to the exacerbation and the MRI changes, she switched to Aubagio in 10-24-12. She tolerates it well and liver function tests have been fine.   REVIEW OF SYSTEMS:  Constitutional: No fevers, chills, sweats, or change in appetite Eyes: No visual changes, double vision, eye pain Ear, nose and throat: No hearing loss, ear pain, nasal congestion, sore throat Cardiovascular: No chest pain, palpitations Respiratory:  No shortness of breath at rest or with exertion.   No wheezes GastrointestinaI: No nausea, vomiting, diarrhea, abdominal pain, fecal incontinence Genitourinary:  No dysuria, urinary retention or frequency.  No nocturia. Musculoskeletal:  No neck pain, back pain Integumentary: No rash, pruritus, skin lesions Neurological: as above Psychiatric: No depression at this time.  No anxiety Endocrine: No palpitations, diaphoresis, change in appetite, change in weigh or increased thirst.   She has felt  cold since thyroid med's changed Hematologic/Lymphatic:  No anemia, purpura, petechiae.  Allergic/Immunologic: No itchy/runny eyes, nasal congestion, recent allergic reactions, rashes  ALLERGIES: Allergies  Allergen Reactions  . Erythromycin Other (See Comments)    Abdominal cramping  . Propofol Other (See Comments)    Has trouble waking up  . Morphine And Related     Patient described heaviness and loss of feeling in legs coupled with a squeezing feeling in the epigastric region. Requested no further Morphine doses.       HOME MEDICATIONS: Outpatient Medications Prior to Visit  Medication Sig Dispense Refill  . aspirin 81 MG tablet Take 81 mg by mouth daily.     . brimonidine (ALPHAGAN) 0.2 % ophthalmic solution Place 1 drop into the right eye 2 (two) times daily. 5 mL 12  . Cholecalciferol (VITAMIN D-3) 1000 units CAPS Take 2,000 tablets by mouth daily.     Marland Kitchen doxycycline (VIBRA-TABS) 100 MG tablet Take 100 mg by mouth 2 (two) times daily.    . lansoprazole (PREVACID) 30 MG capsule Take 30 mg by mouth daily.     Marland Kitchen levothyroxine (SYNTHROID, LEVOTHROID) 112 MCG tablet Take 112 mcg by mouth daily.      . potassium chloride SA (K-DUR,KLOR-CON) 20 MEQ tablet Take 20 mEq by mouth 2 (two) times daily.    . pravastatin (PRAVACHOL) 20 MG tablet Take 20 mg by mouth daily.     . prednisoLONE acetate (PRED FORTE) 1 % ophthalmic suspension Place 1 drop into the right eye 4 (four) times daily. 5 mL 0  . Probiotic Product (PROBIOTIC PO) Take 1 capsule by mouth daily.    . vitamin E 400 UNIT capsule Take 400 Units by mouth daily.      . diazepam (VALIUM) 5 MG tablet One or two po daily 60 tablet 5  . FLUoxetine (PROZAC) 20 MG capsule Take 1 capsule (20 mg total) by mouth daily. 90 capsule 4  . Teriflunomide (AUBAGIO) 14 MG TABS Take 14 mg by mouth daily. 90 tablet 3  . triamterene-hydrochlorothiazide (DYAZIDE) 37.5-25 MG capsule Take 1 each (1 capsule total) by mouth daily. 90 capsule 3  . zolpidem (AMBIEN) 10 MG tablet Take 1 tablet (10 mg total) by mouth at bedtime. 90 tablet 1   No  facility-administered medications prior to visit.     PAST MEDICAL HISTORY: Past Medical History:  Diagnosis Date  . Anxiety   . Celiac disease   . Cervical dysplasia   . Claustrophobia   . Complication of anesthesia    slow to awaken, doesn't want Propofol- when she had it before, had to be reminded to breath.  . Depression   . Endometriosis   . Family history of adverse reaction to anesthesia    mother difficulty intubation  . GERD (gastroesophageal reflux disease)   . Hearing loss   . High cholesterol   . Hypothyroidism   . Multiple sclerosis ([place])   . PONV (postoperative nausea and vomiting)   . Vision abnormalities     PAST SURGICAL HISTORY: Past Surgical History:  Procedure Laterality Date  . [place] VITRECTOMY WITH 20 GAUGE MVR PORT FOR MACULAR HOLE Right 05/06/2018  . 25 GAUGE PARS PLANA VITRECTOMY WITH 20 GAUGE MVR PORT FOR MACULAR HOLE Right 05/06/2018   Procedure: 25 GAUGE PARS PLANA VITRECTOMY WITH 20 GAUGE MVR PORT FOR MACULAR HOLE;  Surgeon: Hayden Pedro, MD;  Location: [place];  Service: Ophthalmology;  Laterality: Right;  . ABDOMINAL HYSTERECTOMY  1999   TAH,BSO menorrhgia,,endometriosis  . CHOLECYSTECTOMY    . COLONOSCOPY    . COLPOSCOPY    . FINGER SURGERY Right    5 finger   . GAS/FLUID EXCHANGE Right 05/06/2018   Procedure: GAS/FLUID EXCHANGE C3F8;  Surgeon: Hayden Pedro, MD;  Location: [place];  Service: Ophthalmology;  Laterality: Right;  . GYNECOLOGIC CRYOSURGERY  1987  . LAPAROSCOPY    . MEMBRANE PEEL Right 05/06/2018   Procedure: MEMBRANE PEEL;  Surgeon: Hayden Pedro, MD;  Location: [place];  Service: Ophthalmology;  Laterality: Right;  . OOPHORECTOMY    . PHOTOCOAGULATION WITH LASER Right 05/06/2018   Procedure: PHOTOCOAGULATION WITH LASER;  Surgeon: Hayden Pedro, MD;  Location: [place];  Service: Ophthalmology;  Laterality: Right;  . ROTATOR CUFF REPAIR Right   . VITRECTOMY      FAMILY HISTORY: Family History  Problem  Relation Age of Onset  . Breast cancer Mother        age 84's  . Cancer Mother        UTERINE  . Colon cancer Mother   . Diabetes Father   . Hypertension Father   . Heart disease Father   . Cancer Maternal Grandfather        Stomach cancer  . Heart disease Paternal Grandfather     SOCIAL HISTORY:  Social History   Socioeconomic History  . Marital status: Married    Spouse name: Not on file  . Number of children: Not on file  . Years of education: Not on file  . Highest education level: Not on file  Occupational History  . Not on file  Social Needs  . Financial resource strain: Not on file  . Food insecurity:    Worry: Not on file    Inability: Not on file  . Transportation needs:    Medical: Not on file    Non-medical: Not on file  Tobacco Use  . Smoking status: Former Smoker    Years: 15.00  . Smokeless tobacco: Never Used  . Tobacco comment: 05/05/18- quit at least 15 years ago  Substance and Sexual Activity  . Alcohol use: No  . Drug use: No  . Sexual activity: Not on file  Lifestyle  . Physical activity:    Days per week: Not on file    Minutes per session: Not on file  . Stress: Not on file  Relationships  . Social connections:    Talks on phone: Not on file    Gets together: Not on file    Attends religious service: Not on file    Active member of club or organization: Not on file    Attends meetings of clubs or organizations: Not on file    Relationship status: Not on file  . Intimate partner violence:    Fear of current or ex partner: Not on file    Emotionally abused: Not on file    Physically abused: Not on file    Forced sexual activity: Not on file  Other Topics Concern  . Not on file  Social History Narrative  . Not on file     PHYSICAL EXAM  There were no vitals filed for this visit.  There is no height or weight on file to calculate BMI.   General: The patient is well-developed and well-nourished and in no acute distress   Neck:.  The neck is nontender with good ROM   Neurologic Exam  Mental status: The patient is alert  and oriented x 3 at the time of the examination. The patient has apparent normal recent and remote memory, with an apparently normal attention span and concentration ability.   Speech is normal.  Cranial nerves: Extraocular movements are full.  Facial strength and sensation is normal. Trapezius strength is normal.. No dysarthria is noted.  The tongue is midline, and the patient has symmetric elevation of the soft palate. No obvious hearing deficits are noted.  Motor: Muscle bulk is normal.  Muscle tone is increased.  Strength is 5/5.  Sensory: She has intact touch and vibration sensation in the arms and legs.  Coordination: Good finger-nose-finger and heel-to-shin.  Gait and station: The station is normal.  Gait is near normal but the tandem gait is wide.  Romberg is negative.  Reflexes: Deep tendon reflexes are symmetric and normal bilaterally.      DIAGNOSTIC DATA (LABS, IMAGING, TESTING) - I reviewed patient records, labs, notes, testing and imaging myself where available.  Lab Results  Component Value Date   WBC 4.2 08/15/2018   HGB 14.4 08/15/2018   HCT 43.3 08/15/2018   MCV 85 08/15/2018   PLT 285 08/15/2018      Component Value Date/Time   NA 138 08/15/2018 1032   K 3.9 08/15/2018 1032   CL 98 08/15/2018 1032   CO2 24 08/15/2018 1032   GLUCOSE 92 08/15/2018 1032   GLUCOSE 94 05/06/2018 0945   BUN 17 08/15/2018 1032   CREATININE 0.84 08/15/2018 1032   CALCIUM 9.5 08/15/2018 1032   PROT 6.6 08/15/2018 1032   ALBUMIN 4.4 08/15/2018 1032   AST 17 08/15/2018 1032   ALT 18 08/15/2018 1032   ALKPHOS 69 08/15/2018 1032   BILITOT 0.3 08/15/2018 1032   GFRNONAA 72 08/15/2018 1032   GFRAA 83 08/15/2018 1032        ASSESSMENT AND PLAN  Multiple sclerosis (HCC)  OSA (obstructive sleep apnea)  Chronic fatigue  Ataxic gait  Insomnia, unspecified type      Hobert Poplaski A. Felecia Shelling, MD, PhD 02/23/2375, 2:83 PM Certified in Neurology, Clinical Neurophysiology, Sleep Medicine, Pain Medicine and Neuroimaging  [facility] [address] [place], [place] [address] [phone]

## 2018-12-31 ENCOUNTER — Encounter: Payer: Self-pay | Admitting: Neurology

## 2019-01-01 ENCOUNTER — Telehealth: Payer: Self-pay | Admitting: *Deleted

## 2019-01-01 NOTE — Telephone Encounter (Signed)
Patient called office stating she missed a call from me. Apologized and stated I did not call her. I checked and Raquel Sarna was not trying to reach her. No other orders placed for someone else to be calling. She verbalized understanding.

## 2019-01-06 ENCOUNTER — Ambulatory Visit (INDEPENDENT_AMBULATORY_CARE_PROVIDER_SITE_OTHER): Payer: Medicare Other | Admitting: Psychology

## 2019-01-06 ENCOUNTER — Ambulatory Visit: Payer: Self-pay | Admitting: Neurology

## 2019-01-06 DIAGNOSIS — F331 Major depressive disorder, recurrent, moderate: Secondary | ICD-10-CM | POA: Diagnosis not present

## 2019-01-19 DIAGNOSIS — E038 Other specified hypothyroidism: Secondary | ICD-10-CM | POA: Diagnosis not present

## 2019-01-19 DIAGNOSIS — E7849 Other hyperlipidemia: Secondary | ICD-10-CM | POA: Diagnosis not present

## 2019-01-19 DIAGNOSIS — R82998 Other abnormal findings in urine: Secondary | ICD-10-CM | POA: Diagnosis not present

## 2019-01-19 DIAGNOSIS — Z0000 Encounter for general adult medical examination without abnormal findings: Secondary | ICD-10-CM | POA: Diagnosis not present

## 2019-01-20 ENCOUNTER — Ambulatory Visit: Payer: Medicare Other | Admitting: Psychology

## 2019-01-26 DIAGNOSIS — H8110 Benign paroxysmal vertigo, unspecified ear: Secondary | ICD-10-CM | POA: Diagnosis not present

## 2019-01-26 DIAGNOSIS — K219 Gastro-esophageal reflux disease without esophagitis: Secondary | ICD-10-CM | POA: Diagnosis not present

## 2019-01-26 DIAGNOSIS — E785 Hyperlipidemia, unspecified: Secondary | ICD-10-CM | POA: Diagnosis not present

## 2019-01-26 DIAGNOSIS — E049 Nontoxic goiter, unspecified: Secondary | ICD-10-CM | POA: Diagnosis not present

## 2019-01-26 DIAGNOSIS — E669 Obesity, unspecified: Secondary | ICD-10-CM | POA: Diagnosis not present

## 2019-01-26 DIAGNOSIS — E039 Hypothyroidism, unspecified: Secondary | ICD-10-CM | POA: Diagnosis not present

## 2019-01-26 DIAGNOSIS — G35 Multiple sclerosis: Secondary | ICD-10-CM | POA: Diagnosis not present

## 2019-01-26 DIAGNOSIS — Z0000 Encounter for general adult medical examination without abnormal findings: Secondary | ICD-10-CM | POA: Diagnosis not present

## 2019-01-26 DIAGNOSIS — F419 Anxiety disorder, unspecified: Secondary | ICD-10-CM | POA: Diagnosis not present

## 2019-02-03 ENCOUNTER — Ambulatory Visit: Payer: Medicare Other | Admitting: Psychology

## 2019-02-11 ENCOUNTER — Encounter (INDEPENDENT_AMBULATORY_CARE_PROVIDER_SITE_OTHER): Payer: Medicare Other | Admitting: Ophthalmology

## 2019-02-11 ENCOUNTER — Other Ambulatory Visit: Payer: Self-pay

## 2019-02-11 DIAGNOSIS — H35341 Macular cyst, hole, or pseudohole, right eye: Secondary | ICD-10-CM | POA: Diagnosis not present

## 2019-02-11 DIAGNOSIS — H43813 Vitreous degeneration, bilateral: Secondary | ICD-10-CM | POA: Diagnosis not present

## 2019-02-11 DIAGNOSIS — H353121 Nonexudative age-related macular degeneration, left eye, early dry stage: Secondary | ICD-10-CM | POA: Diagnosis not present

## 2019-02-11 DIAGNOSIS — H2513 Age-related nuclear cataract, bilateral: Secondary | ICD-10-CM | POA: Diagnosis not present

## 2019-02-17 ENCOUNTER — Ambulatory Visit (INDEPENDENT_AMBULATORY_CARE_PROVIDER_SITE_OTHER): Payer: Medicare Other | Admitting: Psychology

## 2019-02-17 DIAGNOSIS — F331 Major depressive disorder, recurrent, moderate: Secondary | ICD-10-CM

## 2019-02-18 ENCOUNTER — Ambulatory Visit: Payer: Medicare Other | Admitting: Neurology

## 2019-03-02 ENCOUNTER — Ambulatory Visit (INDEPENDENT_AMBULATORY_CARE_PROVIDER_SITE_OTHER): Payer: Medicare Other | Admitting: Psychology

## 2019-03-02 DIAGNOSIS — F331 Major depressive disorder, recurrent, moderate: Secondary | ICD-10-CM

## 2019-03-03 ENCOUNTER — Ambulatory Visit: Payer: Medicare Other | Admitting: Psychology

## 2019-03-16 ENCOUNTER — Ambulatory Visit: Payer: Medicare Other | Admitting: Psychology

## 2019-04-02 DIAGNOSIS — H353121 Nonexudative age-related macular degeneration, left eye, early dry stage: Secondary | ICD-10-CM | POA: Diagnosis not present

## 2019-04-02 DIAGNOSIS — H35033 Hypertensive retinopathy, bilateral: Secondary | ICD-10-CM | POA: Diagnosis not present

## 2019-04-02 DIAGNOSIS — H25041 Posterior subcapsular polar age-related cataract, right eye: Secondary | ICD-10-CM | POA: Diagnosis not present

## 2019-04-02 DIAGNOSIS — H25013 Cortical age-related cataract, bilateral: Secondary | ICD-10-CM | POA: Diagnosis not present

## 2019-04-02 DIAGNOSIS — H2511 Age-related nuclear cataract, right eye: Secondary | ICD-10-CM | POA: Diagnosis not present

## 2019-04-02 DIAGNOSIS — H2513 Age-related nuclear cataract, bilateral: Secondary | ICD-10-CM | POA: Diagnosis not present

## 2019-04-06 ENCOUNTER — Other Ambulatory Visit: Payer: Self-pay

## 2019-04-06 ENCOUNTER — Ambulatory Visit: Payer: Self-pay | Admitting: Psychology

## 2019-04-07 ENCOUNTER — Other Ambulatory Visit: Payer: Self-pay

## 2019-04-07 ENCOUNTER — Encounter: Payer: Self-pay | Admitting: Women's Health

## 2019-04-07 ENCOUNTER — Ambulatory Visit (INDEPENDENT_AMBULATORY_CARE_PROVIDER_SITE_OTHER): Payer: Medicare Other | Admitting: Women's Health

## 2019-04-07 VITALS — Ht 63.0 in | Wt 196.0 lb

## 2019-04-07 DIAGNOSIS — Z1231 Encounter for screening mammogram for malignant neoplasm of breast: Secondary | ICD-10-CM | POA: Diagnosis not present

## 2019-04-07 DIAGNOSIS — Z01419 Encounter for gynecological examination (general) (routine) without abnormal findings: Secondary | ICD-10-CM | POA: Diagnosis not present

## 2019-04-07 DIAGNOSIS — Z803 Family history of malignant neoplasm of breast: Secondary | ICD-10-CM | POA: Diagnosis not present

## 2019-04-07 NOTE — Progress Notes (Signed)
Leslie Ryan [facility] [birth year] [id number]    History:    Presents for breast and pelvic exam with no complaints.  1999 TAH with BSO for endometriosis on no HRT.  Not sexually active in years husband health,  Alzheimer's currently living at [facility].  Normal Pap and mammogram history.  2018 normal DEXA. MS, hypertension, anxiety/depression , hypothyroidism, hypercholesteremia, GERD, and celiac disease primary care manages.  Has gained 50 pounds in the past 2 years relates to stress.   Past medical history, past surgical history, family history and social history were all reviewed and documented in the EPIC chart.  Reports a very stressful year with having to place husband in long-term care, adopted daughter was released from prison and has returned to using drugs since released.  Has full custody of her 69 year old granddaughter who lives with her full-time who is doing okay.  ROS:  A ROS was performed and pertinent positives and negatives are included.  Exam:  Vitals:   04/06/68 1600  Weight: 196 lb (88.9 kg)  Height: 5' 3"  (1.6 m)   Body mass index is 34.72 kg/m.   General appearance:  Normal Thyroid:  Symmetrical, normal in size, without palpable masses or nodularity. Respiratory  Auscultation:  Clear without wheezing or rhonchi Cardiovascular  Auscultation:  Regular rate, without rubs, murmurs or gallops  Edema/varicosities:  Not grossly evident Abdominal  Soft,nontender, without masses, guarding or rebound.  Liver/spleen:  No organomegaly noted  Hernia:  None appreciated  Skin  Inspection:  Grossly normal   Breasts: Examined lying and sitting.     Right: Without masses, retractions, discharge or axillary adenopathy.     Left: Without masses, retractions, discharge or axillary adenopathy. Gentitourinary   Inguinal/mons:  Normal without inguinal adenopathy  External genitalia:  Normal  BUS/Urethra/Skene's glands:  Normal  Vagina: Atrophic/asymptomatic   Cervix: And  uterus absent   Adnexa/parametria:     Rt: Without masses or tenderness.   Lt: Without masses or tenderness.  Anus and perineum: Normal  Digital rectal exam: Normal sphincter tone without palpated masses or tenderness  Assessment/Plan:  68 y.o. MWF G0 +1 adopted for breast and pelvic exam with no GYN complaints.  1999 TAH with BSO on no HRT Hypertension, hypercholesteremia, hypothyroidism, anxiety/depression, GERD, anxiety/depression-primary care manages labs and meds MS-neurologist manages Situational stress husband in long-term care daughter history of drug abuse   Plan: Encouraged to continue counseling, condolences given regarding husband's Alzheimer's and daughters drug abuse problem.  SBEs, continue annual screening mammogram, had today, calcium rich foods, vitamin D 2000 daily encouraged.  Aware of need to increase regular cardio and balance type exercise, decrease calorie/carbs..  Pap screening guidelines reviewed none done.    [place], 4:57 PM 04/07/2019

## 2019-04-07 NOTE — Patient Instructions (Signed)
Health Maintenance After Age 69 After age 60, you are at a higher risk for certain long-term diseases and infections as well as injuries from falls. Falls are a major cause of broken bones and head injuries in people who are older than age 62. Getting regular preventive care can help to keep you healthy and well. Preventive care includes getting regular testing and making lifestyle changes as recommended by your health care provider. Talk with your health care provider about:  Which screenings and tests you should have. A screening is a test that checks for a disease when you have no symptoms.  A diet and exercise plan that is right for you. What should I know about screenings and tests to prevent falls? Screening and testing are the best ways to find a health problem early. Early diagnosis and treatment give you the best chance of managing medical conditions that are common after age 26. Certain conditions and lifestyle choices may make you more likely to have a fall. Your health care provider may recommend:  Regular vision checks. Poor vision and conditions such as cataracts can make you more likely to have a fall. If you wear glasses, make sure to get your prescription updated if your vision changes.  Medicine review. Work with your health care provider to regularly review all of the medicines you are taking, including over-the-counter medicines. Ask your health care provider about any side effects that may make you more likely to have a fall. Tell your health care provider if any medicines that you take make you feel dizzy or sleepy.  Osteoporosis screening. Osteoporosis is a condition that causes the bones to get weaker. This can make the bones weak and cause them to break more easily.  Blood pressure screening. Blood pressure changes and medicines to control blood pressure can make you feel dizzy.  Strength and balance checks. Your health care provider may recommend certain tests to check your  strength and balance while standing, walking, or changing positions.  Foot health exam. Foot pain and numbness, as well as not wearing proper footwear, can make you more likely to have a fall.  Depression screening. You may be more likely to have a fall if you have a fear of falling, feel emotionally low, or feel unable to do activities that you used to do.  Alcohol use screening. Using too much alcohol can affect your balance and may make you more likely to have a fall. What actions can I take to lower my risk of falls? General instructions  Talk with your health care provider about your risks for falling. Tell your health care provider if: ? You fall. Be sure to tell your health care provider about all falls, even ones that seem minor. ? You feel dizzy, sleepy, or off-balance.  Take over-the-counter and prescription medicines only as told by your health care provider. These include any supplements.  Eat a healthy diet and maintain a healthy weight. A healthy diet includes low-fat dairy products, low-fat (lean) meats, and fiber from whole grains, beans, and lots of fruits and vegetables. Home safety  Remove any tripping hazards, such as rugs, cords, and clutter.  Install safety equipment such as grab bars in bathrooms and safety rails on stairs.  Keep rooms and walkways well-lit. Activity   Follow a regular exercise program to stay fit. This will help you maintain your balance. Ask your health care provider what types of exercise are appropriate for you.  If you need a cane or  walker, use it as recommended by your health care provider.  Wear supportive shoes that have nonskid soles. Lifestyle  Do not drink alcohol if your health care provider tells you not to drink.  If you drink alcohol, limit how much you have: ? 0-1 drink a day for women. ? 0-2 drinks a day for men.  Be aware of how much alcohol is in your drink. In the [place] one drink equals one typical bottle of beer (12  oz), one-half glass of wine (5 oz), or one shot of hard liquor (1 oz).  Do not use any products that contain nicotine or tobacco, such as cigarettes and e-cigarettes. If you need help quitting, ask your health care provider. Summary  Having a healthy lifestyle and getting preventive care can help to protect your health and wellness after age 63.  Screening and testing are the best way to find a health problem early and help you avoid having a fall. Early diagnosis and treatment give you the best chance for managing medical conditions that are more common for people who are older than age 70.  Falls are a major cause of broken bones and head injuries in people who are older than age 57. Take precautions to prevent a fall at home.  Work with your health care provider to learn what changes you can make to improve your health and wellness and to prevent falls. This information is not intended to replace advice given to you by your health care provider. Make sure you discuss any questions you have with your health care provider. Document Released: 06/19/2017 Document Revised: 11/27/2018 Document Reviewed: 06/19/2017 Elsevier Patient Education  2020 [employer].

## 2019-04-14 DIAGNOSIS — H25811 Combined forms of age-related cataract, right eye: Secondary | ICD-10-CM | POA: Diagnosis not present

## 2019-04-14 DIAGNOSIS — H2511 Age-related nuclear cataract, right eye: Secondary | ICD-10-CM | POA: Diagnosis not present

## 2019-04-21 ENCOUNTER — Ambulatory Visit (INDEPENDENT_AMBULATORY_CARE_PROVIDER_SITE_OTHER): Payer: Medicare Other | Admitting: Psychology

## 2019-04-21 DIAGNOSIS — F331 Major depressive disorder, recurrent, moderate: Secondary | ICD-10-CM | POA: Diagnosis not present

## 2019-05-04 DIAGNOSIS — H2512 Age-related nuclear cataract, left eye: Secondary | ICD-10-CM | POA: Diagnosis not present

## 2019-05-04 DIAGNOSIS — H25012 Cortical age-related cataract, left eye: Secondary | ICD-10-CM | POA: Diagnosis not present

## 2019-05-05 ENCOUNTER — Ambulatory Visit: Payer: Medicare Other | Admitting: Psychology

## 2019-05-11 DIAGNOSIS — Z23 Encounter for immunization: Secondary | ICD-10-CM | POA: Diagnosis not present

## 2019-05-12 DIAGNOSIS — H2512 Age-related nuclear cataract, left eye: Secondary | ICD-10-CM | POA: Diagnosis not present

## 2019-05-12 DIAGNOSIS — H25812 Combined forms of age-related cataract, left eye: Secondary | ICD-10-CM | POA: Diagnosis not present

## 2019-05-12 DIAGNOSIS — H25012 Cortical age-related cataract, left eye: Secondary | ICD-10-CM | POA: Diagnosis not present

## 2019-05-19 ENCOUNTER — Ambulatory Visit: Payer: Medicare Other | Admitting: Psychology

## 2019-05-28 DIAGNOSIS — L988 Other specified disorders of the skin and subcutaneous tissue: Secondary | ICD-10-CM | POA: Diagnosis not present

## 2019-05-28 DIAGNOSIS — L718 Other rosacea: Secondary | ICD-10-CM | POA: Diagnosis not present

## 2019-06-02 ENCOUNTER — Ambulatory Visit: Payer: Medicare Other | Admitting: Psychology

## 2019-06-16 ENCOUNTER — Telehealth: Payer: Self-pay | Admitting: Neurology

## 2019-06-16 ENCOUNTER — Ambulatory Visit: Payer: Medicare Other | Admitting: Psychology

## 2019-06-16 MED ORDER — CYCLOBENZAPRINE HCL 5 MG PO TABS: 5.0000 mg | ORAL_TABLET | Freq: Three times a day (TID) | ORAL | 0 refills | Status: DC | PRN

## 2019-06-16 MED ORDER — ZOLPIDEM TARTRATE ER 12.5 MG PO TBCR: 12.5000 mg | EXTENDED_RELEASE_TABLET | Freq: Every evening | ORAL | 0 refills | Status: DC | PRN

## 2019-06-16 NOTE — Telephone Encounter (Signed)
Pt is calling in stating she has been having a throbbing pain in her lower left leg for about a month , she also states the Ambien is only working for an estimate of 4 hours. She is requesting a call back.

## 2019-06-16 NOTE — Telephone Encounter (Signed)
Called pt, relayed Dr. Garth Bigness recommendation. She is agreeable to plan.  Advised Ambien CR may need PA via insurance which I will work on. She would likes rx's to go to [facility] on file.

## 2019-06-16 NOTE — Telephone Encounter (Signed)
Spoke with Dr. Felecia Shelling. He recommends changing her Ambien CR 12.36m po qhs.  He would like to add flexeril 525mto take 1 tablet by mouth up to three times daily. #90, no refills to see if this helps with pain in leg.

## 2019-06-19 ENCOUNTER — Telehealth: Payer: Self-pay | Admitting: Neurology

## 2019-06-19 NOTE — Telephone Encounter (Signed)
Pt called and stated that her zolpidem (AMBIEN CR) 12.5 MG CR tablet is not working for her. Please advise.

## 2019-06-22 NOTE — Telephone Encounter (Signed)
We can add trazodone to the Ambien  100 mg 1/2 to 1 po qHS    #30 #11

## 2019-06-22 NOTE — Telephone Encounter (Signed)
Called pt back. Ambien CR now helping. No need for additional medication at this time per pt. She will let us know if anything changes.

## 2019-06-30 ENCOUNTER — Ambulatory Visit: Payer: Medicare Other | Admitting: Psychology

## 2019-07-06 ENCOUNTER — Other Ambulatory Visit: Payer: Self-pay | Admitting: Neurology

## 2019-07-06 ENCOUNTER — Telehealth: Payer: Self-pay

## 2019-07-06 MED ORDER — ZOLPIDEM TARTRATE ER 12.5 MG PO TBCR: 12.5000 mg | EXTENDED_RELEASE_TABLET | Freq: Every evening | ORAL | 2 refills | Status: DC | PRN

## 2019-07-06 MED ORDER — ZOLPIDEM TARTRATE ER 12.5 MG PO TBCR: 12.5000 mg | EXTENDED_RELEASE_TABLET | Freq: Every evening | ORAL | 0 refills | Status: DC | PRN

## 2019-07-06 NOTE — Telephone Encounter (Signed)
 Database Verified LR: 06-16-2019 Qty: 30 Pending appointment: No pending appt

## 2019-07-07 NOTE — Telephone Encounter (Signed)
Pt states she was told our computers were down on yesterday.  Pt is asking asking again that her refill for her Zolpidem Tartrate   be called into  [facility] - [place], [place] - [address]

## 2019-07-14 ENCOUNTER — Ambulatory Visit: Payer: Medicare Other | Admitting: Psychology

## 2019-07-22 DIAGNOSIS — E785 Hyperlipidemia, unspecified: Secondary | ICD-10-CM | POA: Diagnosis not present

## 2019-07-22 DIAGNOSIS — E049 Nontoxic goiter, unspecified: Secondary | ICD-10-CM | POA: Diagnosis not present

## 2019-07-22 DIAGNOSIS — G35 Multiple sclerosis: Secondary | ICD-10-CM | POA: Diagnosis not present

## 2019-08-03 ENCOUNTER — Telehealth: Payer: Self-pay | Admitting: Neurology

## 2019-08-03 MED ORDER — ZOLPIDEM TARTRATE ER 12.5 MG PO TBCR: 12.5000 mg | EXTENDED_RELEASE_TABLET | Freq: Every evening | ORAL | 3 refills | Status: DC | PRN

## 2019-08-03 NOTE — Addendum Note (Signed)
Addended by: Arlice Colt A on: 08/03/2019 01:46 PM   Modules accepted: Orders

## 2019-08-03 NOTE — Telephone Encounter (Signed)
Pt has called stating that her zolpidem (AMBIEN CR) 12.5 MG CR tablet along with all her other medications should only be called into [facility]  Pt is asking if a rush can be put on this order for zolpidem (AMBIEN CR) 12.5 MG CR tabletsince she has been waiting since mid Nov.  Please call

## 2019-08-03 NOTE — Telephone Encounter (Signed)
Called [facility] and spoke with rep. Advised MD not able to escribe zolpidem. They advised controlled meds have to be printed/signed by MD and faxed. Confirmed fax is [phone]. Advised faxed [id number]. She is going to send urgent message to pharmacy to call me back to get this resolved.  I tried faxing again and was able to get fax to go through. Received confirmation.

## 2019-08-03 NOTE — Addendum Note (Signed)
Addended by: Hope Pigeon on: 08/03/2019 02:48 PM   Modules accepted: Orders

## 2019-08-03 NOTE — Addendum Note (Signed)
Addended by: Hope Pigeon on: 08/03/2019 10:21 AM   Modules accepted: Orders

## 2019-08-03 NOTE — Telephone Encounter (Signed)
[facility] will not take e-scripts for controlled substance.  Does she have a Management consultant (o we can mail)

## 2019-08-03 NOTE — Telephone Encounter (Addendum)
Reviewed pt chart. Appears Leslie Ryan was sent to incorrect pharmacy by mistake 07/06/2019. I called [facility] where it was sent and spoke with Ramish Cx rx ambien on file there.  Patient last seen 12/30/2018 and has no f/u scheduled. I called pt and schedule appt for 08/24/19 at 9am with Dr. Felecia Shelling. She did not want appt with NP. I offered appt 08/05/19 at 930am but she was unable to take this appt., worried about possible snow.  Checked drug registry. She last refilled ambien 06/16/19 #30 at [place]. She is due. I updated her pharmacy to only have [facility] listed per pt request.    Per daughter, she noticed in the last few months her mother has been stumbling, tripping. Having walking issues, cannot walk in straight line. When speaking, she is using wrong words per daughter. Confirmed she is still taking Aubagio and has not missed any doses.

## 2019-08-03 NOTE — Addendum Note (Signed)
Addended by: Britt Bottom on: 08/03/2019 04:13 PM   Modules accepted: Orders

## 2019-08-04 NOTE — Telephone Encounter (Signed)
Jared from [facility] called to confirm they received faxed rx and good to go. Nothing further needed.

## 2019-08-24 ENCOUNTER — Encounter: Payer: Self-pay | Admitting: Neurology

## 2019-08-24 ENCOUNTER — Telehealth: Payer: Self-pay | Admitting: *Deleted

## 2019-08-24 ENCOUNTER — Other Ambulatory Visit: Payer: Self-pay

## 2019-08-24 ENCOUNTER — Ambulatory Visit (INDEPENDENT_AMBULATORY_CARE_PROVIDER_SITE_OTHER): Payer: Medicare Other | Admitting: Neurology

## 2019-08-24 VITALS — BP 100/70 | HR 71 | Temp 97.6°F | Ht 63.0 in | Wt 173.0 lb

## 2019-08-24 DIAGNOSIS — R5382 Chronic fatigue, unspecified: Secondary | ICD-10-CM | POA: Diagnosis not present

## 2019-08-24 DIAGNOSIS — G4733 Obstructive sleep apnea (adult) (pediatric): Secondary | ICD-10-CM

## 2019-08-24 DIAGNOSIS — E559 Vitamin D deficiency, unspecified: Secondary | ICD-10-CM

## 2019-08-24 DIAGNOSIS — Z8639 Personal history of other endocrine, nutritional and metabolic disease: Secondary | ICD-10-CM

## 2019-08-24 DIAGNOSIS — H8109 Meniere's disease, unspecified ear: Secondary | ICD-10-CM | POA: Diagnosis not present

## 2019-08-24 DIAGNOSIS — R260 Ataxic gait: Secondary | ICD-10-CM

## 2019-08-24 DIAGNOSIS — G4700 Insomnia, unspecified: Secondary | ICD-10-CM

## 2019-08-24 DIAGNOSIS — G35 Multiple sclerosis: Secondary | ICD-10-CM

## 2019-08-24 MED ORDER — TRIAMTERENE-HCTZ 37.5-25 MG PO CAPS: 1.0000 | ORAL_CAPSULE | Freq: Every day | ORAL | 3 refills | Status: DC

## 2019-08-24 MED ORDER — DIAZEPAM 5 MG PO TABS: ORAL_TABLET | ORAL | 1 refills | Status: DC

## 2019-08-24 MED ORDER — CYCLOBENZAPRINE HCL 5 MG PO TABS: 5.0000 mg | ORAL_TABLET | Freq: Every day | ORAL | 3 refills | Status: DC

## 2019-08-24 MED ORDER — ZOLPIDEM TARTRATE ER 12.5 MG PO TBCR: 12.5000 mg | EXTENDED_RELEASE_TABLET | Freq: Every evening | ORAL | 1 refills | Status: DC | PRN

## 2019-08-24 MED ORDER — LORAZEPAM 1 MG PO TABS: ORAL_TABLET | ORAL | 0 refills | Status: DC

## 2019-08-24 NOTE — Progress Notes (Signed)
[facility]  PATIENT: Leslie Ryan DOB: 1951-06-10  REFERRING CLINICIAN: Burnard Bunting HISTORY FROM: patient REASON FOR VISIT: MS   HISTORICAL  CHIEF COMPLAINT:  Chief Complaint  Patient presents with  . Follow-up    RM 13,alone. Last seen 12/30/2018.   . Multiple Sclerosis    Takes Aubagio    HISTORY OF PRESENT ILLNESS:  Leslie Ryan is a 69 y.o. woman with relapsing remitting multiple sclerosis.    Update 08/24/2019: Her MS is stable and she tolerates Aubagio well.     Her daughter feels her gait is slightly worse but she feels it is stable. She gets   Strength is fine and no significant numbness.   Bladder function is fine.    Vision is doing well.    HCTZ has helped to eliminate the severe spells of dizziness though she still has some milder ones --- when they occur she sits down and rests.     She has fatigue.    Sleep is poor --  She takes Ambien XR 12.5 which helps her fall asleep but after 4-5 hours she is up and has trouble falling back asleep.      She uses CPAP and feels the mask is uncomfortable.   She has never tried a medication of the sleep maintenance issues.    Update 12/30/2018 (virtual) She feels her MS has been mostly stable.  She has no recent exacerbations and no significant new symptoms.  She is on Aubagio.  She tolerates it well.  She feels gait is doing the same with reduced balance but no falls.   She notes mild leg weakness .  Mild hand and foot numbness.   Sometimes she notes mild diplopia.   Bladder function is fine.  She has occasional severe episodes of dizziness, less now than in the past.  She was diagnosed with Mnire's.  HCTZ appears to help.  She has had poor sleep, daytime somnolence, fatigue and OSA signs.  At her last visit she was scheduled for a PSG.  It showed severe sleep apnea with an AHI equals 55.  On 05/22/2019 she had a CPAP titration and was titrated successfully to +10 cm.   She uses CPAP nightly and  download shows great compliance (97%) and efficacy (AHI=3.3)  Mood is doing well.   She recently started seeing psychiatry.    EPWORTH SLEEPINESS SCALE  On a scale of 0 - 3 what is the chance of dozing:  Sitting and Reading:  2   Watching TV:   1 Sitting inactive in a public place: 0 Passenger in car for one hour: 0 Lying down to rest in the afternoon: 2 Sitting and talking to someone: 0 Sitting quietly after lunch:  0 In a car, stopped in traffic:  0  Total (out of 24):  5/24    Update 08/15/2018: She feels her MS is stable and she has no new symptoms.   She tolerates Aubagio well and labwork has been fine. She repots having labwork with her PCP over the summer and everything was fine.      Gait, strength are ok.  She has numbness in her fingers bilaterally and sometimes in her feet.   She gets some muscle cramps.   She has a hard time sleeping.    Hr spasticity and cramps seem worse at night.   She has probable OSA waking up with a snort many nights.   She has some daytime sleepiness but it is  not severe.  EPWORTH SLEEPINESS SCALE  On a scale of 0 - 3 what is the chance of dozing:  Sitting and Reading:   3 Watching TV:    2 Sitting inactive in a public place:  0 Passenger in car for one hour:  0 Lying down to rest in the afternoon: 2 Sitting and talking to someone:  0 Sitting quietly after lunch:  0 In a car, stopped in traffic:  0  Total (out of 24):     7/24   Normal sleepiness   She notes some depression.  Her husband is in a memory care unit now for vascular/Alzheimer dementia.     She had a vitrectomy in September 2019 and is going to have cataract surgery soon.     Update 11/15/2017: She feels her MS is stable but she is under a lot of stress with her husband having moderate AD. Mood is poor with dperession and irritability.         She wobbles and stumbles some with gait but falls are very rare.    Her gait issues have progressed gradually.   She feels the leg  spasticity is helped by valium.  She feels fatigue is worse.   She is sleeping worse due to her husband's AD issues (he wanders and sometimes wakes up gets dressed and is ready for the day.  He has sundowning and Seroquel has not helped him much).     She has Meniere's and takes Dyazide if she gets any vertigo      From 03/27/2017: She feels her MS is mostly ok but family issues are adding stress.   Her husband has AD and she primarily cares for her granddaughter  MS:   She is on Aubagio since 2012-10-24.   She is tolerating it well. She gets labwork several times a year with Dr. Reynaldo Minium (PCP)  She has not had any exacerbation.  Gait/strength/sensatoin:  Gait is stable.   It is mildly spastic and ataxic.   She stumbles some but no falls.   She has mild leg weakness and spasticity, slightly worse on her left.     She notes mild weakness in the legs. Diazepam has helped a little bit with this spasticity.  She is not exercising because she feels so weak afterwards.    Bladder/Bowel:  She denies any significant difficulty with her bladder. Bowel function is fine.   Vision:  Vision is doing ok.   No changes..   She has been diagnosed with macular degeneration  Fatigue/sleep:   She notes a lot of  fatigue, and doing chores especially tires her out.       Fatigue is more physical than mental.  She has insomnia that is helped by Ambien. However, her RLS seems to be doing worse and she can't get comfortable some nights (1-2 times a month).     Sleep maintenance is more of a problem than sleep onset.   Once asleep, she usually gets at least 5 hours sleep but wakes up a few times some nights  Mood/Cognition:    She has had depression And notes more stress the last year. (mother died Oct 25, 2015 ago and husband has AD).   She is on fluoxetine and feels it does help some..  She also occasionally takes diazepam for anxiety.   She denies major cognitive issues but notes mild memory problems at times.    Meniere's:  She has  had a couple episodes of Mnire's disease  with severe vertigo and N/V.   The combination of the diazepam and Dyazide has greatly helped.      We dicussed taking an additional  valium on those rare days when she has mild vertigo and is concerned it will intensify.   .      MS History:  In the mid 1980s, she presented with right optic neuritis while working at [employer] [facility]. A lumbar puncture was inconclusive but an MRI of the brain was consistent with MS a few years later. In the 1980s and early 1990s, she received multiple rounds of IV steroids for occasional exacerbations involving vision or gait. In 1994, she started Betaseron stopped quickly due to severe injection site reactions. In January 1997, after an exacerbation she was started on Avonex and continued on Avonex for the next 15 years or so. She had some issues with compliance on some occasions.   In the late 1990s, she began to have more permanent gait issues the right foot drop that slowly worsened. I saw early MRI reports from 2000 showing 2 foci at C6 and C7 on the cervical spine and predominantly periventricular foci on brain MRI.   On 02/20/2013, she had the sudden onset of vertigo that was not altered by movements. Her gait worsened with ataxia and she felt like she was being pushed to the right as she walked. She did not note any numbness or weakness or visual changes. An MRI of the brain on 02/25/2013 showed multiple periventricular white matter lesions consistent with MS. She received 3 days of IV steroids with benefit. I personally reviewed the MRI and felt that there were 3 new lesions in the left frontal parietal out of that were not present on her prior MRI dated 08/08/2010 the foci enhanced. Due to the exacerbation and the MRI changes, she switched to Aubagio in 2014. She tolerates it well and liver function tests have been fine.   REVIEW OF SYSTEMS:  Constitutional: No fevers, chills, sweats, or change in appetite Eyes: No visual  changes, double vision, eye pain Ear, nose and throat: No hearing loss, ear pain, nasal congestion, sore throat Cardiovascular: No chest pain, palpitations Respiratory:  No shortness of breath at rest or with exertion.   No wheezes GastrointestinaI: No nausea, vomiting, diarrhea, abdominal pain, fecal incontinence Genitourinary:  No dysuria, urinary retention or frequency.  No nocturia. Musculoskeletal:  No neck pain, back pain Integumentary: No rash, pruritus, skin lesions Neurological: as above Psychiatric: No depression at this time.  No anxiety Endocrine: No palpitations, diaphoresis, change in appetite, change in weigh or increased thirst.   She has felt cold since thyroid med's changed Hematologic/Lymphatic:  No anemia, purpura, petechiae. Allergic/Immunologic: No itchy/runny eyes, nasal congestion, recent allergic reactions, rashes  ALLERGIES: Allergies  Allergen Reactions  . Erythromycin Other (See Comments)    Abdominal cramping  . Propofol Other (See Comments)    Has trouble waking up  . Morphine And Related     Patient described heaviness and loss of feeling in legs coupled with a squeezing feeling in the epigastric region. Requested no further Morphine doses.       HOME MEDICATIONS: Outpatient Medications Prior to Visit  Medication Sig Dispense Refill  . aspirin 81 MG tablet Take 81 mg by mouth daily.     . Cholecalciferol (VITAMIN D-3) 1000 units CAPS Take 2,000 tablets by mouth daily.     Marland Kitchen FLUoxetine (PROZAC) 20 MG capsule Take 1 capsule (20 mg total) by mouth daily. 90 capsule  4  . lansoprazole (PREVACID) 30 MG capsule Take 30 mg by mouth daily.     Marland Kitchen levothyroxine (SYNTHROID, LEVOTHROID) 112 MCG tablet Take 112 mcg by mouth daily.      . potassium chloride SA (K-DUR,KLOR-CON) 20 MEQ tablet Take 20 mEq by mouth 2 (two) times daily.    . pravastatin (PRAVACHOL) 20 MG tablet Take 20 mg by mouth daily.     . Probiotic Product (PROBIOTIC PO) Take 1 capsule by mouth  daily.    . Teriflunomide (AUBAGIO) 14 MG TABS Take 14 mg by mouth daily. 90 tablet 3  . vitamin E 400 UNIT capsule Take 400 Units by mouth daily.      . brimonidine (ALPHAGAN) 0.2 % ophthalmic solution Place 1 drop into the right eye 2 (two) times daily. 5 mL 12  . diazepam (VALIUM) 5 MG tablet One or two po daily 180 tablet 1  . triamterene-hydrochlorothiazide (DYAZIDE) 37.5-25 MG capsule Take 1 each (1 capsule total) by mouth daily. 90 capsule 3  . zolpidem (AMBIEN CR) 12.5 MG CR tablet Take 1 tablet (12.5 mg total) by mouth at bedtime as needed for sleep. 30 tablet 3  . Armodafinil 200 MG TABS One half to one po every morning (Patient not taking: Reported on 08/24/2019) 90 tablet 1  . cyclobenzaprine (FLEXERIL) 5 MG tablet Take 1 tablet (5 mg total) by mouth every 8 (eight) hours as needed for muscle spasms. 90 tablet 0  . prednisoLONE acetate (PRED FORTE) 1 % ophthalmic suspension Place 1 drop into the right eye 4 (four) times daily. 5 mL 0   No facility-administered medications prior to visit.    PAST MEDICAL HISTORY: Past Medical History:  Diagnosis Date  . Anxiety   . Celiac disease   . Cervical dysplasia   . Claustrophobia   . Complication of anesthesia    slow to awaken, doesn't want Propofol- when she had it before, had to be reminded to breath.  . Depression   . Endometriosis   . Family history of adverse reaction to anesthesia    mother difficulty intubation  . GERD (gastroesophageal reflux disease)   . Hearing loss   . High cholesterol   . Hypothyroidism   . Multiple sclerosis ([place])   . PONV (postoperative nausea and vomiting)   . Vision abnormalities     PAST SURGICAL HISTORY: Past Surgical History:  Procedure Laterality Date  . [place] VITRECTOMY WITH 20 GAUGE MVR PORT FOR MACULAR HOLE Right 05/06/2018  . 25 GAUGE PARS PLANA VITRECTOMY WITH 20 GAUGE MVR PORT FOR MACULAR HOLE Right 05/06/2018   Procedure: 25 GAUGE PARS PLANA VITRECTOMY WITH 20 GAUGE  MVR PORT FOR MACULAR HOLE;  Surgeon: Hayden Pedro, MD;  Location: [facility];  Service: Ophthalmology;  Laterality: Right;  . ABDOMINAL HYSTERECTOMY  1999   TAH,BSO menorrhgia,,endometriosis  . CHOLECYSTECTOMY    . COLONOSCOPY    . COLPOSCOPY    . FINGER SURGERY Right    5 finger   . GAS/FLUID EXCHANGE Right 05/06/2018   Procedure: GAS/FLUID EXCHANGE C3F8;  Surgeon: Hayden Pedro, MD;  Location: [facility];  Service: Ophthalmology;  Laterality: Right;  . GYNECOLOGIC CRYOSURGERY  1987  . LAPAROSCOPY    . MEMBRANE PEEL Right 05/06/2018   Procedure: MEMBRANE PEEL;  Surgeon: Hayden Pedro, MD;  Location: [facility];  Service: Ophthalmology;  Laterality: Right;  . OOPHORECTOMY    . PHOTOCOAGULATION WITH LASER Right 05/06/2018   Procedure: PHOTOCOAGULATION WITH LASER;  Surgeon: Hayden Pedro, MD;  Location: [facility];  Service: Ophthalmology;  Laterality: Right;  . ROTATOR CUFF REPAIR Right   . VITRECTOMY      FAMILY HISTORY: Family History  Problem Relation Age of Onset  . Breast cancer Mother        age 51's  . Cancer Mother        UTERINE  . Colon cancer Mother   . Diabetes Father   . Hypertension Father   . Heart disease Father   . Cancer Maternal Grandfather        Stomach cancer  . Heart disease Paternal Grandfather     SOCIAL HISTORY:  Social History   Socioeconomic History  . Marital status: Married    Spouse name: Not on file  . Number of children: Not on file  . Years of education: Not on file  . Highest education level: Not on file  Occupational History  . Not on file  Tobacco Use  . Smoking status: Former Smoker    Years: 15.00  . Smokeless tobacco: Never Used  . Tobacco comment: 05/05/18- quit at least 15 years ago  Substance and Sexual Activity  . Alcohol use: No  . Drug use: No  . Sexual activity: Not Currently    Birth control/protection: Surgical  Other Topics Concern  . Not on file  Social History Narrative  . Not on file   Social Determinants of  Health   Financial Resource Strain:   . Difficulty of Paying Living Expenses: Not on file  Food Insecurity:   . Worried About Charity fundraiser in the Last Year: Not on file  . Ran Out of Food in the Last Year: Not on file  Transportation Needs:   . Lack of Transportation (Medical): Not on file  . Lack of Transportation (Non-Medical): Not on file  Physical Activity:   . Days of Exercise per Week: Not on file  . Minutes of Exercise per Session: Not on file  Stress:   . Feeling of Stress : Not on file  Social Connections:   . Frequency of Communication with Friends and Family: Not on file  . Frequency of Social Gatherings with Friends and Family: Not on file  . Attends Religious Services: Not on file  . Active Member of Clubs or Organizations: Not on file  . Attends Archivist Meetings: Not on file  . Marital Status: Not on file  Intimate Partner Violence:   . Fear of Current or Ex-Partner: Not on file  . Emotionally Abused: Not on file  . Physically Abused: Not on file  . Sexually Abused: Not on file     PHYSICAL EXAM  Vitals:   08/24/19 0855  BP: 100/70  Pulse: 71  Temp: 97.6 F (36.4 C)  SpO2: 92%  Weight: 173 lb (78.5 kg)  Height: 5' 3"  (1.6 m)    Body mass index is 30.65 kg/m.   General: The patient is well-developed and well-nourished and in no acute distress  Neck:.  The neck is nontender with good ROM   Neurologic Exam  Mental status: The patient is alert and oriented x 3 at the time of the examination. The patient has apparent normal recent and remote memory, with an apparently normal attention span and concentration ability.   Speech is normal.  Cranial nerves: Extraocular movements are full.  Facial strength and sensation is normal. Trapezius strength is normal.. No dysarthria is noted.  The tongue is midline, and  the patient has symmetric elevation of the soft palate. No obvious hearing deficits are noted.  Motor: Muscle bulk is normal.   Muscle tone is increased.  Strength is 5/5.  Sensory: She has intact touch and vibration sensation in the arms and legs.  Coordination: Finger-nose-finger and heel-to-shin is performed well..  Gait and station: The station is normal.  Gait is minimally wide and tandem gait is wide.  Romberg is negative.  Reflexes: Deep tendon reflexes are symmetric and normal bilaterally.      DIAGNOSTIC DATA (LABS, IMAGING, TESTING) - I reviewed patient records, labs, notes, testing and imaging myself where available.  Lab Results  Component Value Date   WBC 4.2 08/15/2018   HGB 14.4 08/15/2018   HCT 43.3 08/15/2018   MCV 85 08/15/2018   PLT 285 08/15/2018      Component Value Date/Time   NA 138 08/15/2018 1032   K 3.9 08/15/2018 1032   CL 98 08/15/2018 1032   CO2 24 08/15/2018 1032   GLUCOSE 92 08/15/2018 1032   GLUCOSE 94 05/06/2018 0945   BUN 17 08/15/2018 1032   CREATININE 0.84 08/15/2018 1032   CALCIUM 9.5 08/15/2018 1032   PROT 6.6 08/15/2018 1032   ALBUMIN 4.4 08/15/2018 1032   AST 17 08/15/2018 1032   ALT 18 08/15/2018 1032   ALKPHOS 69 08/15/2018 1032   BILITOT 0.3 08/15/2018 1032   GFRNONAA 72 08/15/2018 1032   GFRAA 83 08/15/2018 1032        ASSESSMENT AND PLAN  Multiple sclerosis (HCC) - Plan: MR BRAIN WO CONTRAST, MR CERVICAL SPINE WO CONTRAST, CBC with Differential/Platelets, CMP  Meniere's disease, unspecified laterality  OSA (obstructive sleep apnea)  Chronic fatigue  Ataxic gait  Insomnia, unspecified type  H/O vitamin D deficiency - Plan: Vitamin D, 25-hydroxy  Vitamin D deficiency  Vitamin D deficiency, unspecified  - Plan: Vitamin D, 25-hydroxy   1.   Continue Aubagio for MS.  We will check some lab work today including CBC with differential, CMP and vitamin D.  We also need to check an MRI of the brain and cervical spine to determine if she is having any subclinical progression.  If this is occurring we may need to switch to a more efficacious  disease modifying therapy.  I will call in 2 Ativan to take before the MRI. 2.   Continue medications for spasticity, insomnia.  Add Flexeril 5 mg nightly to see if that helps her insomnia. 3.   Stay active and exercise as tolerated. 4.    Return in 6 months or sooner if there are new or worsening  Lonie Rummell A. Felecia Shelling, MD, PhD 0/0/3704, 8:88 PM Certified in Neurology, Clinical Neurophysiology, Sleep Medicine, Pain Medicine and Neuroimaging  [facility] [address] [place], [place] [address] [phone]

## 2019-08-24 NOTE — Telephone Encounter (Signed)
Faxed printed/signed rx ambien and valium to [facility] at [phone]. Received fax confirmation for both.

## 2019-08-25 ENCOUNTER — Telehealth: Payer: Self-pay | Admitting: *Deleted

## 2019-08-25 ENCOUNTER — Telehealth: Payer: Self-pay

## 2019-08-25 LAB — COMPREHENSIVE METABOLIC PANEL
ALT: 26 IU/L (ref 0–32)
AST: 23 IU/L (ref 0–40)
Albumin/Globulin Ratio: 1.8 (ref 1.2–2.2)
Albumin: 4.4 g/dL (ref 3.8–4.8)
Alkaline Phosphatase: 72 IU/L (ref 39–117)
BUN/Creatinine Ratio: 13 (ref 12–28)
BUN: 11 mg/dL (ref 8–27)
Bilirubin Total: 0.4 mg/dL (ref 0.0–1.2)
CO2: 26 mmol/L (ref 20–29)
Calcium: 9.9 mg/dL (ref 8.7–10.3)
Chloride: 94 mmol/L — ABNORMAL LOW (ref 96–106)
Creatinine, Ser: 0.83 mg/dL (ref 0.57–1.00)
GFR calc Af Amer: 83 mL/min/{1.73_m2} (ref >59)
GFR calc non Af Amer: 72 mL/min/{1.73_m2} (ref >59)
Globulin, Total: 2.4 g/dL (ref 1.5–4.5)
Glucose: 94 mg/dL (ref 65–99)
Potassium: 3.9 mmol/L (ref 3.5–5.2)
Sodium: 135 mmol/L (ref 134–144)
Total Protein: 6.8 g/dL (ref 6.0–8.5)

## 2019-08-25 LAB — CBC WITH DIFFERENTIAL/PLATELET
Basophils Absolute: 0.1 10*3/uL (ref 0.0–0.2)
Basos: 1 %
EOS (ABSOLUTE): 0 10*3/uL (ref 0.0–0.4)
Eos: 1 %
Hematocrit: 46.5 % (ref 34.0–46.6)
Hemoglobin: 14.8 g/dL (ref 11.1–15.9)
Immature Grans (Abs): 0 10*3/uL (ref 0.0–0.1)
Immature Granulocytes: 0 %
Lymphocytes Absolute: 1.1 10*3/uL (ref 0.7–3.1)
Lymphs: 25 %
MCH: 27.7 pg (ref 26.6–33.0)
MCHC: 31.8 g/dL (ref 31.5–35.7)
MCV: 87 fL (ref 79–97)
Monocytes Absolute: 0.5 10*3/uL (ref 0.1–0.9)
Monocytes: 12 %
Neutrophils Absolute: 2.6 10*3/uL (ref 1.4–7.0)
Neutrophils: 61 %
Platelets: 293 10*3/uL (ref 150–450)
RBC: 5.35 x10E6/uL — ABNORMAL HIGH (ref 3.77–5.28)
RDW: 13.1 % (ref 11.7–15.4)
WBC: 4.3 10*3/uL (ref 3.4–10.8)

## 2019-08-25 LAB — VITAMIN D 25 HYDROXY (VIT D DEFICIENCY, FRACTURES): Vit D, 25-Hydroxy: 43.6 ng/mL (ref 30.0–100.0)

## 2019-08-25 NOTE — Telephone Encounter (Signed)
Patient LVM stating she does not use mychart and would like a CB in regards to her results. Please follow up.

## 2019-08-25 NOTE — Telephone Encounter (Signed)
-----   Message from Britt Bottom, MD sent at 08/25/2019  8:49 AM EST ----- Please let the patient know that the lab work is fine.

## 2019-08-25 NOTE — Telephone Encounter (Signed)
Called and spoke with pt. Relayed results are fine per Dr. Felecia Shelling. Pt verbalized understanding.

## 2019-09-01 ENCOUNTER — Telehealth: Payer: Self-pay

## 2019-09-01 NOTE — Telephone Encounter (Signed)
error 

## 2019-10-13 ENCOUNTER — Telehealth: Payer: Self-pay | Admitting: Neurology

## 2019-10-13 DIAGNOSIS — Z23 Encounter for immunization: Secondary | ICD-10-CM | POA: Diagnosis not present

## 2019-10-13 NOTE — Telephone Encounter (Signed)
Pt has called to inform that she is not sleeping.  Pt states that on this past Sunday she didn't go to bed until 4:00 a.m. and was up by 6 a.m. Pt states this is not good for her MS,please call

## 2019-10-13 NOTE — Telephone Encounter (Signed)
She is already on high-dose Ambien at night and takes Valium as well during the day.  At the last visit I added Flexeril at night.  If that is not helping her sleep any better we can stop the Flexeril and try trazodone 100 mg nightly.   #30 #5

## 2019-10-13 NOTE — Telephone Encounter (Signed)
Dr. Felecia Shelling- do you have any recommendations?

## 2019-10-14 NOTE — Addendum Note (Signed)
Addended by: Wyvonnia Lora on: 10/14/2019 08:43 AM   Modules accepted: Orders

## 2019-10-14 NOTE — Telephone Encounter (Addendum)
I called pt back. Confirmed she is still taking ambien and valium. However, she feels Leslie Ryan is ineffective. She would rather not take trazodone and ambien together. She stopped flexeril because this caused severe constipation. She has been up since 3:43am today. She feels sleep issues started when she started armodafinil. She confirmed she is taking this in the morning only. She wants to know if Dr. Felecia Shelling has a different recommendation other than trazodone. States her husband takes this and she is worried about potential SE.

## 2019-10-14 NOTE — Telephone Encounter (Signed)
She should try half a tablet of the armodafinil.  We have her on one of the bigger pills so the lower dose may help without affecting sleep

## 2019-10-15 ENCOUNTER — Telehealth: Payer: Self-pay | Admitting: Neurology

## 2019-10-15 MED ORDER — TRAZODONE HCL 50 MG PO TABS: ORAL_TABLET | ORAL | 5 refills | Status: DC

## 2019-10-15 NOTE — Telephone Encounter (Signed)
I called pt back. She spoke with Jael in phone room. She expressed dissatisfaction that no one has called her back. Jael did advised we have 24-48 hr to call back. She does not want to take armodafinil, cannot tolerate. Does not want to take trazodone and ambien. She requested to talk to Dr. Felecia Shelling directly. Call was transferred to Dr. Felecia Shelling.

## 2019-10-15 NOTE — Telephone Encounter (Signed)
I spoke to Ms. Robel about her sleep issues.  The addition of armodafinil has made it harder for her to fall asleep at night.  Even cutting the dose down to 1/2 pill led to insomnia.  Therefore, she will stop the armodafinil.  She will try taking a second diazepam at bedtime instead of Ambien CR to see if that helps her sleep more.  She should take 1 or the other but not both.  I will also call in trazodone 50 mg tablets that she can take one half to 1 at night in addition to either the diazepam or the Ambien (but not both).  She will let us know next week if she is still having issues

## 2019-12-01 ENCOUNTER — Telehealth: Payer: Self-pay | Admitting: *Deleted

## 2019-12-01 NOTE — Telephone Encounter (Signed)
Faxed printed/signed orders re: PAP chinstrap 1 per 6 months to [facility] at [phone]. Received fax confirmation.

## 2019-12-23 ENCOUNTER — Telehealth: Payer: Self-pay | Admitting: Neurology

## 2019-12-23 DIAGNOSIS — G35 Multiple sclerosis: Secondary | ICD-10-CM

## 2019-12-23 MED ORDER — AUBAGIO 14 MG PO TABS: 14.0000 mg | ORAL_TABLET | Freq: Every day | ORAL | 3 refills | Status: DC

## 2019-12-23 NOTE — Telephone Encounter (Signed)
E-scribed refill as requested.

## 2019-12-23 NOTE — Telephone Encounter (Signed)
1) Medication(s) Requested (by name): Teriflunomide (AUBAGIO) 14 MG TABS   2) Pharmacy of Choice: [facility] [place]  [place], [place] [address]

## 2019-12-30 MED ORDER — TRAZODONE HCL 50 MG PO TABS: ORAL_TABLET | ORAL | 1 refills | Status: DC

## 2019-12-30 MED ORDER — FLUOXETINE HCL 20 MG PO CAPS: 20.0000 mg | ORAL_CAPSULE | Freq: Every day | ORAL | 4 refills | Status: DC

## 2019-12-30 NOTE — Telephone Encounter (Signed)
Pt called wanting to know if her Teriflunomide (AUBAGIO) 14 MG TABS was called in for her. Pt was informed that the refill had been escribed for her already. Pt stated that she is needing all of her prescriptions called in not just the Teriflunomide (AUBAGIO) 14 MG TABS  Please advise.

## 2019-12-30 NOTE — Telephone Encounter (Signed)
Reviewed pt chart. Diazepam sent 08/24/19 #180, 1 refill (too soon to send another rx). Refills for zolpidem and dyazide also too soon. E-scribed refill for fluoxetine and trazodone.

## 2019-12-30 NOTE — Addendum Note (Signed)
Addended byRonnald Ramp, Jefferey Lippmann L on: 12/30/2019 09:00 AM   Modules accepted: Orders

## 2020-01-06 ENCOUNTER — Telehealth: Payer: Self-pay | Admitting: Neurology

## 2020-01-06 MED ORDER — ZOLPIDEM TARTRATE ER 12.5 MG PO TBCR: 12.5000 mg | EXTENDED_RELEASE_TABLET | Freq: Every evening | ORAL | 1 refills | Status: DC | PRN

## 2020-01-06 NOTE — Telephone Encounter (Addendum)
Called pt back. She got trazodone called in instead of her [place]. I reviewed her chart. Advised she spoke with Dr. Felecia Shelling on 10/15/19 who recommended the following:"I will also call in trazodone 50 mg tablets that she can take one half to 1 at night in addition to either the diazepam or the Ambien (but not both).  She will let us know next week if she is still having issues". She did not remember this. She would like to try taking trazodone and ambien together and not the diazepam. She knows not to take diazepam and ambien together if she takes trazodone. She is needing refill for ambien. Rx sent in 08/24/19 #90, 1 refill only had #90 with no refills on bottle. Advised I will request from MD and then fax to pharmacy for her. She will call back if she has any further questions. She was last seen 08/24/19 and next f/u is 02/24/20.   Faxed rx to [facility], [place] at [phone]. Received fax confirmation.

## 2020-01-06 NOTE — Addendum Note (Signed)
Addended by: Wyvonnia Lora on: 01/06/2020 04:55 PM   Modules accepted: Orders

## 2020-01-06 NOTE — Telephone Encounter (Signed)
Pt is asking for a call with concerns about taking traZODone (DESYREL) 50 MG tablet because she has never been on it.  P;ease call to discuss the change from Ambien

## 2020-02-03 DIAGNOSIS — E038 Other specified hypothyroidism: Secondary | ICD-10-CM | POA: Diagnosis not present

## 2020-02-03 DIAGNOSIS — E7849 Other hyperlipidemia: Secondary | ICD-10-CM | POA: Diagnosis not present

## 2020-02-12 ENCOUNTER — Encounter (INDEPENDENT_AMBULATORY_CARE_PROVIDER_SITE_OTHER): Payer: Medicare Other | Admitting: Ophthalmology

## 2020-02-12 ENCOUNTER — Other Ambulatory Visit: Payer: Self-pay

## 2020-02-12 DIAGNOSIS — H43813 Vitreous degeneration, bilateral: Secondary | ICD-10-CM | POA: Diagnosis not present

## 2020-02-12 DIAGNOSIS — H35341 Macular cyst, hole, or pseudohole, right eye: Secondary | ICD-10-CM | POA: Diagnosis not present

## 2020-02-12 DIAGNOSIS — H353121 Nonexudative age-related macular degeneration, left eye, early dry stage: Secondary | ICD-10-CM

## 2020-02-24 ENCOUNTER — Telehealth: Payer: Self-pay | Admitting: Neurology

## 2020-02-24 ENCOUNTER — Ambulatory Visit (INDEPENDENT_AMBULATORY_CARE_PROVIDER_SITE_OTHER): Payer: Medicare Other | Admitting: Neurology

## 2020-02-24 ENCOUNTER — Encounter: Payer: Self-pay | Admitting: Neurology

## 2020-02-24 ENCOUNTER — Other Ambulatory Visit: Payer: Self-pay

## 2020-02-24 VITALS — BP 133/75 | HR 74 | Ht 63.0 in | Wt 169.0 lb

## 2020-02-24 DIAGNOSIS — G4733 Obstructive sleep apnea (adult) (pediatric): Secondary | ICD-10-CM

## 2020-02-24 DIAGNOSIS — H8109 Meniere's disease, unspecified ear: Secondary | ICD-10-CM | POA: Diagnosis not present

## 2020-02-24 DIAGNOSIS — G35 Multiple sclerosis: Secondary | ICD-10-CM | POA: Diagnosis not present

## 2020-02-24 DIAGNOSIS — G4700 Insomnia, unspecified: Secondary | ICD-10-CM

## 2020-02-24 DIAGNOSIS — R5382 Chronic fatigue, unspecified: Secondary | ICD-10-CM

## 2020-02-24 DIAGNOSIS — F32A Depression, unspecified: Secondary | ICD-10-CM

## 2020-02-24 DIAGNOSIS — F329 Major depressive disorder, single episode, unspecified: Secondary | ICD-10-CM

## 2020-02-24 DIAGNOSIS — E559 Vitamin D deficiency, unspecified: Secondary | ICD-10-CM

## 2020-02-24 MED ORDER — DIAZEPAM 5 MG PO TABS: ORAL_TABLET | ORAL | 1 refills | Status: DC

## 2020-02-24 MED ORDER — TRIAMTERENE-HCTZ 37.5-25 MG PO CAPS: 1.0000 | ORAL_CAPSULE | Freq: Every day | ORAL | 3 refills | Status: DC

## 2020-02-24 NOTE — Telephone Encounter (Signed)
Medicare/[facility] order sent to [facility]. No auth they will reach out to the patient to schedule.

## 2020-02-24 NOTE — Progress Notes (Signed)
Faxed printed/signed rx diazepam to [facility] at [phone]. Received fax confirmation.

## 2020-02-24 NOTE — Progress Notes (Signed)
[facility]  PATIENT: Leslie Ryan DOB: 03-05-1951  REFERRING CLINICIAN: Burnard Bunting HISTORY FROM: patient REASON FOR VISIT: MS   HISTORICAL  CHIEF COMPLAINT:  Chief Complaint  Patient presents with  . Follow-up    RM 12, alone. Last seen 08/24/2019.   . Multiple Sclerosis    On Aubagio.    HISTORY OF PRESENT ILLNESS:  Leslie Ryan is a 69 y.o. woman with relapsing remitting multiple sclerosis.    Update 02/24/2020: She is on Aubagio for her RRMS and tolerates it well.   Labs were fine with PCP.  She feels balance is the same.   She holds the bannister with stairs.   She feels it is stable.  No falls.   Her right leg is slightly weaker than left and she has a foot drop if more tired.   Vision is doing well.    She had cataract surgery.    Bladder is the same with urgency and frequency.    She has been diagnosed as having Meniere's.  Vertigo has done well on diuretics.   She just has mild vertigo at times now.    She is on Ambien XR 12.5 for insomnia which helps sleep onset more than maintenance.   Trazodone was stopped due to a hangover.    She still has sleep maintenance insomnia.    She is on CPAP for OSA and mask is often uncomfortable and slips.  She can fall back asleep better if she does not use the mask when she wakes up (after +/- 6 hours)    MS History:  In the mid 1980s, she presented with right optic neuritis while working at [facility]. A lumbar puncture was inconclusive but an MRI of the brain was consistent with MS a few years later. In the 1980s and early 1990s, she received multiple rounds of IV steroids for occasional exacerbations involving vision or gait. In 1994, she started Betaseron stopped quickly due to severe injection site reactions. In January 1997, after an exacerbation she was started on Avonex and continued on Avonex for the next 15 years or so. She had some issues with compliance on some occasions.   In the late  1990s, she began to have more permanent gait issues the right foot drop that slowly worsened. I saw early MRI reports from 2000 showing 2 foci at C6 and C7 on the cervical spine and predominantly periventricular foci on brain MRI.   On 02/20/2013, she had the sudden onset of vertigo that was not altered by movements. Her gait worsened with ataxia and she felt like she was being pushed to the right as she walked. She did not note any numbness or weakness or visual changes. An MRI of the brain on 02/25/2013 showed multiple periventricular white matter lesions consistent with MS. She received 3 days of IV steroids with benefit. I personally reviewed the MRI and felt that there were 3 new lesions in the left frontal parietal out of that were not present on her prior MRI dated 08/08/2010 the foci enhanced. Due to the exacerbation and the MRI changes, she switched to Aubagio in 2014. She tolerates it well and liver function tests have been fine.   REVIEW OF SYSTEMS:  Constitutional: No fevers, chills, sweats, or change in appetite Eyes: No visual changes, double vision, eye pain Ear, nose and throat: No hearing loss, ear pain, nasal congestion, sore throat Cardiovascular: No chest pain, palpitations Respiratory:  No shortness of breath at rest  or with exertion.   No wheezes GastrointestinaI: No nausea, vomiting, diarrhea, abdominal pain, fecal incontinence Genitourinary:  No dysuria, urinary retention or frequency.  No nocturia. Musculoskeletal:  No neck pain, back pain Integumentary: No rash, pruritus, skin lesions Neurological: as above Psychiatric: No depression at this time.  No anxiety Endocrine: No palpitations, diaphoresis, change in appetite, change in weigh or increased thirst.   She has felt cold since thyroid med's changed Hematologic/Lymphatic:  No anemia, purpura, petechiae. Allergic/Immunologic: No itchy/runny eyes, nasal congestion, recent allergic reactions, rashes  ALLERGIES: Allergies   Allergen Reactions  . Erythromycin Other (See Comments)    Abdominal cramping  . Flexeril [Cyclobenzaprine]     Severe constipation  . Propofol Other (See Comments)    Has trouble waking up  . Morphine And Related     Patient described heaviness and loss of feeling in legs coupled with a squeezing feeling in the epigastric region. Requested no further Morphine doses.       HOME MEDICATIONS: Outpatient Medications Prior to Visit  Medication Sig Dispense Refill  . aspirin 81 MG tablet Take 81 mg by mouth daily.     . Cholecalciferol (VITAMIN D-3) 1000 units CAPS Take 2,000 tablets by mouth daily.     Marland Kitchen FLUoxetine (PROZAC) 20 MG capsule Take 1 capsule (20 mg total) by mouth daily. 90 capsule 4  . lansoprazole (PREVACID) 30 MG capsule Take 30 mg by mouth daily.     Marland Kitchen levothyroxine (SYNTHROID, LEVOTHROID) 112 MCG tablet Take 112 mcg by mouth daily.      . potassium chloride SA (K-DUR,KLOR-CON) 20 MEQ tablet Take 20 mEq by mouth 2 (two) times daily.    . pravastatin (PRAVACHOL) 20 MG tablet Take 20 mg by mouth daily.     . Probiotic Product (PROBIOTIC PO) Take 1 capsule by mouth daily.    . Teriflunomide (AUBAGIO) 14 MG TABS Take 14 mg by mouth daily. 90 tablet 3  . vitamin E 400 UNIT capsule Take 400 Units by mouth daily.      Marland Kitchen zolpidem (AMBIEN CR) 12.5 MG CR tablet Take 1 tablet (12.5 mg total) by mouth at bedtime as needed for sleep. 90 tablet 1  . diazepam (VALIUM) 5 MG tablet One or two po daily 180 tablet 1  . LORazepam (ATIVAN) 1 MG tablet Take one or two before MRI.   Must have driver 2 tablet 0  . triamterene-hydrochlorothiazide (DYAZIDE) 37.5-25 MG capsule Take 1 each (1 capsule total) by mouth daily. 90 capsule 3  . Armodafinil 200 MG TABS One half to one po every morning (Patient not taking: Reported on 08/24/2019) 90 tablet 1  . traZODone (DESYREL) 50 MG tablet Take 1/2 to 1 pill po qHS as needed 90 tablet 1   No facility-administered medications prior to visit.    PAST  MEDICAL HISTORY: Past Medical History:  Diagnosis Date  . Anxiety   . Celiac disease   . Cervical dysplasia   . Claustrophobia   . Complication of anesthesia    slow to awaken, doesn't want Propofol- when she had it before, had to be reminded to breath.  . Depression   . Endometriosis   . Family history of adverse reaction to anesthesia    mother difficulty intubation  . GERD (gastroesophageal reflux disease)   . Hearing loss   . High cholesterol   . Hypothyroidism   . Multiple sclerosis ([place])   . PONV (postoperative nausea and vomiting)   . Vision abnormalities  PAST SURGICAL HISTORY: Past Surgical History:  Procedure Laterality Date  . Brady VITRECTOMY WITH 20 GAUGE MVR PORT FOR MACULAR HOLE Right 05/06/2018  . 25 GAUGE PARS PLANA VITRECTOMY WITH 20 GAUGE MVR PORT FOR MACULAR HOLE Right 05/06/2018   Procedure: 25 GAUGE PARS PLANA VITRECTOMY WITH 20 GAUGE MVR PORT FOR MACULAR HOLE;  Surgeon: Hayden Pedro, MD;  Location: [place];  Service: Ophthalmology;  Laterality: Right;  . ABDOMINAL HYSTERECTOMY  1999   TAH,BSO menorrhgia,,endometriosis  . CHOLECYSTECTOMY    . COLONOSCOPY    . COLPOSCOPY    . FINGER SURGERY Right    5 finger   . GAS/FLUID EXCHANGE Right 05/06/2018   Procedure: GAS/FLUID EXCHANGE C3F8;  Surgeon: Hayden Pedro, MD;  Location: [place];  Service: Ophthalmology;  Laterality: Right;  . GYNECOLOGIC CRYOSURGERY  1987  . LAPAROSCOPY    . MEMBRANE PEEL Right 05/06/2018   Procedure: MEMBRANE PEEL;  Surgeon: Hayden Pedro, MD;  Location: [place];  Service: Ophthalmology;  Laterality: Right;  . OOPHORECTOMY    . PHOTOCOAGULATION WITH LASER Right 05/06/2018   Procedure: PHOTOCOAGULATION WITH LASER;  Surgeon: Hayden Pedro, MD;  Location: [place];  Service: Ophthalmology;  Laterality: Right;  . ROTATOR CUFF REPAIR Right   . VITRECTOMY      FAMILY HISTORY: Family History  Problem Relation Age of Onset  . Breast cancer Mother        age 59's   . Cancer Mother        UTERINE  . Colon cancer Mother   . Diabetes Father   . Hypertension Father   . Heart disease Father   . Cancer Maternal Grandfather        Stomach cancer  . Heart disease Paternal Grandfather     SOCIAL HISTORY:  Social History   Socioeconomic History  . Marital status: Married    Spouse name: Not on file  . Number of children: Not on file  . Years of education: Not on file  . Highest education level: Not on file  Occupational History  . Not on file  Tobacco Use  . Smoking status: Former Smoker    Years: 15.00  . Smokeless tobacco: Never Used  . Tobacco comment: 05/05/18- quit at least 15 years ago  Vaping Use  . Vaping Use: Never used  Substance and Sexual Activity  . Alcohol use: No  . Drug use: No  . Sexual activity: Not Currently    Birth control/protection: Surgical  Other Topics Concern  . Not on file  Social History Narrative  . Not on file   Social Determinants of Health   Financial Resource Strain:   . Difficulty of Paying Living Expenses:   Food Insecurity:   . Worried About Charity fundraiser in the Last Year:   . Arboriculturist in the Last Year:   Transportation Needs:   . Film/video editor (Medical):   Marland Kitchen Lack of Transportation (Non-Medical):   Physical Activity:   . Days of Exercise per Week:   . Minutes of Exercise per Session:   Stress:   . Feeling of Stress :   Social Connections:   . Frequency of Communication with Friends and Family:   . Frequency of Social Gatherings with Friends and Family:   . Attends Religious Services:   . Active Member of Clubs or Organizations:   . Attends Archivist Meetings:   Marland Kitchen Marital Status:   Intimate  Partner Violence:   . Fear of Current or Ex-Partner:   . Emotionally Abused:   Marland Kitchen Physically Abused:   . Sexually Abused:      PHYSICAL EXAM  Vitals:   02/24/20 1248  BP: 133/75  Pulse: 74  Weight: 169 lb (76.7 kg)  Height: 5' 3"  (1.6 m)    Body mass  index is 29.94 kg/m.   General: The patient is well-developed and well-nourished and in no acute distress  Neck:.  The neck is nontender with good ROM   Neurologic Exam  Mental status: The patient is alert and oriented x 3 at the time of the examination. The patient has apparent normal recent and remote memory, with an apparently normal attention span and concentration ability.   Speech is normal.  Cranial nerves: Extraocular movements are full.  Facial strength and sensation is normal. Trapezius strength is normal.. No dysarthria is noted.  The tongue is midline, and the patient has symmetric elevation of the soft palate. No obvious hearing deficits are noted.  Motor: Muscle bulk is normal.  Muscle tone is increased.  Strength is 5/5.  Sensory: She has intact touch and vibration sensation in the arms and legs.  Coordination: Finger-nose-finger and heel-to-shin is performed well..  Gait and station: The station is normal.  Gait is minimally wide and tandem gait is wide.  Romberg is negative.  Reflexes: Deep tendon reflexes are symmetric and normal bilaterally.      DIAGNOSTIC DATA (LABS, IMAGING, TESTING) - I reviewed patient records, labs, notes, testing and imaging myself where available.  Lab Results  Component Value Date   WBC 4.3 08/24/2019   HGB 14.8 08/24/2019   HCT 46.5 08/24/2019   MCV 87 08/24/2019   PLT 293 08/24/2019      Component Value Date/Time   NA 135 08/24/2019 0950   K 3.9 08/24/2019 0950   CL 94 (L) 08/24/2019 0950   CO2 26 08/24/2019 0950   GLUCOSE 94 08/24/2019 0950   GLUCOSE 94 05/06/2018 0945   BUN 11 08/24/2019 0950   CREATININE 0.83 08/24/2019 0950   CALCIUM 9.9 08/24/2019 0950   PROT 6.8 08/24/2019 0950   ALBUMIN 4.4 08/24/2019 0950   AST 23 08/24/2019 0950   ALT 26 08/24/2019 0950   ALKPHOS 72 08/24/2019 0950   BILITOT 0.4 08/24/2019 0950   GFRNONAA 72 08/24/2019 0950   GFRAA 83 08/24/2019 0950        ASSESSMENT AND  PLAN  Multiple sclerosis (HCC) - Plan: MR BRAIN WO CONTRAST, MR CERVICAL SPINE WO CONTRAST  Meniere's disease, unspecified laterality  OSA (obstructive sleep apnea)  Insomnia, unspecified type  Chronic fatigue  Vitamin D deficiency  Depression, unspecified depression type   1.   Continue Aubagio for MS.  Lab work with PCP was fine recently.   Check MRI of the brain to determine if subclinical progression is occurring.  If this is occurring, we will need to consider a different disease modifying therapy.  We did discuss that with age, MS often becomes less aggressive and at some point we may be able to stop a disease modifying therapy. 2.   Continue medications for spasticity, insomnia.   Use CPAP nightly  3.   Stay active and exercise as tolerated. 4.    Return in 6 months or sooner if there are new or worsening  45-minute office visit with the majority of the time spent face-to-face for history and physical, discussion/counseling and decision-making.  Additional time with record review  and documentation.   Taison Celani A. Felecia Shelling, MD, PhD 03/21/5052, 9:76 PM Certified in Neurology, Clinical Neurophysiology, Sleep Medicine, Pain Medicine and Neuroimaging  [facility] [address] [place], [place] [address] [phone]

## 2020-03-06 ENCOUNTER — Ambulatory Visit
Admission: RE | Admit: 2020-03-06 | Discharge: 2020-03-06 | Disposition: A | Discharge status: Home / Self Care | Payer: Medicare Other | Source: Ambulatory Visit | Attending: Neurology | Admitting: Neurology

## 2020-03-06 ENCOUNTER — Other Ambulatory Visit: Payer: Self-pay

## 2020-03-06 DIAGNOSIS — G35 Multiple sclerosis: Secondary | ICD-10-CM | POA: Diagnosis not present

## 2020-03-07 ENCOUNTER — Telehealth: Payer: Self-pay | Admitting: Neurology

## 2020-03-07 NOTE — Telephone Encounter (Signed)
Pt called wanting to know if her MRI results have come in. Please advise.

## 2020-03-07 NOTE — Telephone Encounter (Signed)
Please let her know that I took a look at the MRIs of the brain and cervical spine.  The MRI looks good.  Compared to the previous MRI, there are no new lesions.  I also looked at the cervical spine.  There is 1 MS lesion to the left at T1.  There could be another lesion or to higher up but these would be very small.  None of them looked to be new.

## 2020-03-07 NOTE — Telephone Encounter (Signed)
Called and spoke with pt. Relayed results per Dr. Felecia Shelling note. She verbalized understanding and appreciation.

## 2020-04-14 ENCOUNTER — Telehealth: Payer: Self-pay | Admitting: *Deleted

## 2020-04-14 NOTE — Telephone Encounter (Signed)
Faxed back signed cpap orders to [facility] at [phone]. Received fax confirmation.

## 2020-05-06 DIAGNOSIS — Z23 Encounter for immunization: Secondary | ICD-10-CM | POA: Diagnosis not present

## 2020-05-13 ENCOUNTER — Encounter: Payer: Self-pay | Admitting: Women's Health

## 2020-05-13 DIAGNOSIS — Z1231 Encounter for screening mammogram for malignant neoplasm of breast: Secondary | ICD-10-CM | POA: Diagnosis not present

## 2020-05-28 DIAGNOSIS — Z23 Encounter for immunization: Secondary | ICD-10-CM | POA: Diagnosis not present

## 2020-06-02 ENCOUNTER — Telehealth: Payer: Self-pay | Admitting: *Deleted

## 2020-06-02 NOTE — Telephone Encounter (Signed)
Faxed back signed cpap orders to [facility] at [phone]. Received fax confirmation.

## 2020-06-30 ENCOUNTER — Telehealth: Payer: Self-pay | Admitting: *Deleted

## 2020-06-30 MED ORDER — FLUCONAZOLE 150 MG PO TABS
150.0000 mg | ORAL_TABLET | Freq: Once | ORAL | 0 refills | Status: AC
Start: 2020-06-30 — End: 2020-06-30

## 2020-06-30 NOTE — Telephone Encounter (Signed)
Patient called c/o vaginal itching and small amount of white discharge, asked if a diflucan pill could be sent to pharmacy? Annual exam scheduled on 08/09/20. Patient said she will be leaving this afternoon for a flight, unable to come in for visit. Please advise

## 2020-06-30 NOTE — Telephone Encounter (Signed)
Yes, please send 1 tablet to her pharmacy. Thank you.

## 2020-06-30 NOTE — Telephone Encounter (Signed)
Patient informed, Rx sent.

## 2020-07-22 DIAGNOSIS — Z23 Encounter for immunization: Secondary | ICD-10-CM | POA: Diagnosis not present

## 2020-07-25 ENCOUNTER — Encounter: Payer: Self-pay | Admitting: Nurse Practitioner

## 2020-07-25 ENCOUNTER — Other Ambulatory Visit: Payer: Self-pay

## 2020-07-25 ENCOUNTER — Ambulatory Visit (INDEPENDENT_AMBULATORY_CARE_PROVIDER_SITE_OTHER): Payer: Medicare Other | Admitting: Nurse Practitioner

## 2020-07-25 VITALS — BP 132/80

## 2020-07-25 DIAGNOSIS — B372 Candidiasis of skin and nail: Secondary | ICD-10-CM

## 2020-07-25 MED ORDER — NYSTATIN 100000 UNIT/GM EX POWD: 1.0000 "application " | Freq: Two times a day (BID) | CUTANEOUS | 1 refills | Status: DC

## 2020-07-25 NOTE — Progress Notes (Signed)
   Acute Office Visit  Subjective:    Patient ID: Leslie Ryan, female    DOB: 1951/06/06, 69 y.o.   MRN: [id number]   HPI 69 y.o. presents today for redness and itching under bilateral breasts. Her mother had triple negative breast cancer so she is concerned about this.    Review of Systems  Constitutional: Negative.   Skin: Positive for rash (under breasts).       Objective:    Physical Exam Constitutional:      Appearance: Normal appearance.  Chest:     Breasts:        Right: Normal.        Left: Normal.  Skin:    Findings: Rash (red, scaly patches under bilateral breasts) present.     BP 132/80 (BP Location: Right Arm, Patient Position: Sitting, Cuff Size: Normal)   LMP 08/20/1997  Wt Readings from Last 3 Encounters:  02/24/20 169 lb (76.7 kg)  08/24/19 173 lb (78.5 kg)  04/07/19 196 lb (88.9 kg)        Assessment & Plan:   Problem List Items Addressed This Visit    None    Visit Diagnoses    Skin yeast infection    -  Primary   Relevant Medications   nystatin (MYCOSTATIN/NYSTOP) powder      Plan: Reassurance provided on skin irritation being from probable fungal infection. Nystatin powder twice daily until gone and then apply for 1 more week. Clean gently before each application and pat dry, keep area as dry as possible. She will return if symptoms worsen or do not improve. She is agreeable to plan.     Tamela Gammon [facility], 3:45 PM 07/25/2020

## 2020-07-25 NOTE — Patient Instructions (Signed)
Skin Yeast Infection  A skin yeast infection is a condition in which there is an overgrowth of yeast (candida) that normally lives on the skin. This condition usually occurs in areas of the skin that are constantly warm and moist, such as the armpits or the groin. What are the causes? This condition is caused by a change in the normal balance of the yeast and bacteria that live on the skin. What increases the risk? You are more likely to develop this condition if you:  Are obese.  Are pregnant.  Take birth control pills.  Have diabetes.  Take antibiotic medicines.  Take steroid medicines.  Are malnourished.  Have a weak body defense system (immune system).  Are 23 years of age or older.  Wear tight clothing. What are the signs or symptoms? The most common symptom of this condition is itchiness in the affected area. Other symptoms include:  Red, swollen area of the skin.  Bumps on the skin. How is this diagnosed?  This condition is diagnosed with a medical history and physical exam.  Your health care provider may check for yeast by taking light scrapings of the skin to be viewed under a microscope. How is this treated? This condition is treated with medicine. Medicines may be prescribed or be available over the counter. The medicines may be:  Taken by mouth (orally).  Applied as a cream or powder to your skin. Follow these instructions at home:   Take or apply over-the-counter and prescription medicines only as told by your health care provider.  Maintain a healthy weight. If you need help losing weight, talk with your health care provider.  Keep your skin clean and dry.  If you have diabetes, keep your blood sugar under control.  Keep all follow-up visits as told by your health care provider. This is important. Contact a health care provider if:  Your symptoms go away and then return.  Your symptoms do not get better with treatment.  Your symptoms get  worse.  Your rash spreads.  You have a fever or chills.  You have new symptoms.  You have new warmth or redness of your skin. Summary  A skin yeast infection is a condition in which there is an overgrowth of yeast (candida) that normally lives on the skin. This condition is caused by a change in the normal balance of the yeast and bacteria that live on the skin.  Take or apply over-the-counter and prescription medicines only as told by your health care provider.  Keep your skin clean and dry.  Contact a health care provider if your symptoms do not get better with treatment. This information is not intended to replace advice given to you by your health care provider. Make sure you discuss any questions you have with your health care provider. Document Revised: 12/24/2017 Document Reviewed: 12/24/2017 Elsevier Patient Education  [place].

## 2020-07-29 ENCOUNTER — Telehealth: Payer: Self-pay | Admitting: Neurology

## 2020-07-29 NOTE — Telephone Encounter (Signed)
Pt is requesting a refill for zolpidem (AMBIEN CR) 12.5 MG CR tablet.  Pharmacy:  [facility]

## 2020-08-01 ENCOUNTER — Other Ambulatory Visit: Payer: Self-pay | Admitting: *Deleted

## 2020-08-01 MED ORDER — ZOLPIDEM TARTRATE ER 12.5 MG PO TBCR: 12.5000 mg | EXTENDED_RELEASE_TABLET | Freq: Every evening | ORAL | 1 refills | Status: DC | PRN

## 2020-08-01 NOTE — Telephone Encounter (Signed)
Faxed printed/signed rx zolpidem to [facility] at [phone]. Received fax confirmation.

## 2020-08-01 NOTE — Telephone Encounter (Signed)
Reviewed pt chart. Pt last seen 02/24/20 and next f/u 08/31/20. Per drug registry, she last refilled rx 05/04/20 #90. Controlled rx's have to be printed and faxed to [facility]. Rx printed, waiting on MD signature and then will fax.

## 2020-08-09 ENCOUNTER — Encounter: Payer: Self-pay | Admitting: Nurse Practitioner

## 2020-08-09 ENCOUNTER — Other Ambulatory Visit: Payer: Self-pay

## 2020-08-09 ENCOUNTER — Ambulatory Visit (INDEPENDENT_AMBULATORY_CARE_PROVIDER_SITE_OTHER): Payer: Medicare Other | Admitting: Nurse Practitioner

## 2020-08-09 VITALS — BP 130/90 | Ht 64.0 in | Wt 178.0 lb

## 2020-08-09 DIAGNOSIS — Z01419 Encounter for gynecological examination (general) (routine) without abnormal findings: Secondary | ICD-10-CM

## 2020-08-09 DIAGNOSIS — Z90722 Acquired absence of ovaries, bilateral: Secondary | ICD-10-CM

## 2020-08-09 DIAGNOSIS — Z90710 Acquired absence of both cervix and uterus: Secondary | ICD-10-CM

## 2020-08-09 DIAGNOSIS — Z9079 Acquired absence of other genital organ(s): Secondary | ICD-10-CM

## 2020-08-09 NOTE — Progress Notes (Signed)
   Leslie Ryan [facility] 04/24/1951 [id number]   History:  69 y.o. G0 presents for breast and pelvic exam without GYN complaints. 1999 TAH BSO for endometriosis - on no HRT. Normal pap and mammogram history. History of MS, hypothyroidism.   Gynecologic History Patient's last menstrual period was 08/20/1997.   Contraception: status post hysterectomy Last Pap: No longer screening per guidelines Last mammogram: 05/16/2020. Results were: normal Last colonoscopy: 2012. Results were: normal Last Dexa: 03/2017. Results were: normal, 5 year recall  Past medical history, past surgical history, family history and social history were all reviewed and documented in the EPIC chart.  ROS:  A ROS was performed and pertinent positives and negatives are included.  Exam:  Vitals:   08/09/20 1440  BP: 130/90  Weight: 178 lb (80.7 kg)  Height: 5' 4"  (1.626 m)   Body mass index is 30.55 kg/m.  General appearance:  Normal Thyroid:  Symmetrical, normal in size, without palpable masses or nodularity. Respiratory  Auscultation:  Clear without wheezing or rhonchi Cardiovascular  Auscultation:  Regular rate, without rubs, murmurs or gallops  Edema/varicosities:  Not grossly evident Abdominal  Soft,nontender, without masses, guarding or rebound.  Liver/spleen:  No organomegaly noted  Hernia:  None appreciated  Skin  Inspection:  Grossly normal   Breasts: Examined lying and sitting.   Right: Without masses, retractions, discharge or axillary adenopathy.   Left: Without masses, retractions, discharge or axillary adenopathy. Gentitourinary   Inguinal/mons:  Normal without inguinal adenopathy  External genitalia:  Normal  BUS/Urethra/Skene's glands:  Normal  Vagina:  Atrophic changes  Cervix:  Absent  Uterus:  Absent  Adnexa/parametria:     Rt: Without masses or tenderness.   Lt: Without masses or tenderness.  Anus and perineum: Normal  Digital rectal exam: Normal sphincter tone without  palpated masses or tenderness  Assessment/Plan:  69 y.o. G0 for breast and pelvic exam.   Well female exam with routine gynecological exam - Education provided on SBEs, importance of preventative screenings, current guidelines, high calcium diet, regular exercise, and multivitamin daily. Labs with PCP.   History of total abdominal hysterectomy and bilateral salpingo-oophorectomy - 1999 for endometriosis. On no HRT.   Screening for cervical cancer - Normal Pap history.  No longer screening per guidelines.  Screening for breast cancer - Normal mammogram history.  Continue annual screenings.  Normal breast exam today.  Screening for colon cancer - 2012 colonoscopy. Will repeat at GI's recommended interval.   Follow up in 2 years for CE.    Tamela Gammon [facility], 2:50 PM 08/09/2020

## 2020-08-09 NOTE — Patient Instructions (Signed)
Health Maintenance After Age 70 After age 70, you are at a higher risk for certain long-term diseases and infections as well as injuries from falls. Falls are a major cause of broken bones and head injuries in people who are older than age 70. Getting regular preventive care can help to keep you healthy and well. Preventive care includes getting regular testing and making lifestyle changes as recommended by your health care provider. Talk with your health care provider about:  Which screenings and tests you should have. A screening is a test that checks for a disease when you have no symptoms.  A diet and exercise plan that is right for you. What should I know about screenings and tests to prevent falls? Screening and testing are the best ways to find a health problem early. Early diagnosis and treatment give you the best chance of managing medical conditions that are common after age 70. Certain conditions and lifestyle choices may make you more likely to have a fall. Your health care provider may recommend:  Regular vision checks. Poor vision and conditions such as cataracts can make you more likely to have a fall. If you wear glasses, make sure to get your prescription updated if your vision changes.  Medicine review. Work with your health care provider to regularly review all of the medicines you are taking, including over-the-counter medicines. Ask your health care provider about any side effects that may make you more likely to have a fall. Tell your health care provider if any medicines that you take make you feel dizzy or sleepy.  Osteoporosis screening. Osteoporosis is a condition that causes the bones to get weaker. This can make the bones weak and cause them to break more easily.  Blood pressure screening. Blood pressure changes and medicines to control blood pressure can make you feel dizzy.  Strength and balance checks. Your health care provider may recommend certain tests to check your  strength and balance while standing, walking, or changing positions.  Foot health exam. Foot pain and numbness, as well as not wearing proper footwear, can make you more likely to have a fall.  Depression screening. You may be more likely to have a fall if you have a fear of falling, feel emotionally low, or feel unable to do activities that you used to do.  Alcohol use screening. Using too much alcohol can affect your balance and may make you more likely to have a fall. What actions can I take to lower my risk of falls? General instructions  Talk with your health care provider about your risks for falling. Tell your health care provider if: ? You fall. Be sure to tell your health care provider about all falls, even ones that seem minor. ? You feel dizzy, sleepy, or off-balance.  Take over-the-counter and prescription medicines only as told by your health care provider. These include any supplements.  Eat a healthy diet and maintain a healthy weight. A healthy diet includes low-fat dairy products, low-fat (lean) meats, and fiber from whole grains, beans, and lots of fruits and vegetables. Home safety  Remove any tripping hazards, such as rugs, cords, and clutter.  Install safety equipment such as grab bars in bathrooms and safety rails on stairs.  Keep rooms and walkways well-lit. Activity   Follow a regular exercise program to stay fit. This will help you maintain your balance. Ask your health care provider what types of exercise are appropriate for you.  If you need a cane or   walker, use it as recommended by your health care provider.  Wear supportive shoes that have nonskid soles. Lifestyle  Do not drink alcohol if your health care provider tells you not to drink.  If you drink alcohol, limit how much you have: ? 0-1 drink a day for women. ? 0-2 drinks a day for men.  Be aware of how much alcohol is in your drink. In the [place] one drink equals one typical bottle of beer (12  oz), one-half glass of wine (5 oz), or one shot of hard liquor (1 oz).  Do not use any products that contain nicotine or tobacco, such as cigarettes and e-cigarettes. If you need help quitting, ask your health care provider. Summary  Having a healthy lifestyle and getting preventive care can help to protect your health and wellness after age 70.  Screening and testing are the best way to find a health problem early and help you avoid having a fall. Early diagnosis and treatment give you the best chance for managing medical conditions that are more common for people who are older than age 70.  Falls are a major cause of broken bones and head injuries in people who are older than age 70. Take precautions to prevent a fall at home.  Work with your health care provider to learn what changes you can make to improve your health and wellness and to prevent falls. This information is not intended to replace advice given to you by your health care provider. Make sure you discuss any questions you have with your health care provider. Document Revised: 11/27/2018 Document Reviewed: 06/19/2017 Elsevier Patient Education  2020 Elsevier Inc.  

## 2020-08-10 DIAGNOSIS — K219 Gastro-esophageal reflux disease without esophagitis: Secondary | ICD-10-CM | POA: Diagnosis not present

## 2020-08-10 DIAGNOSIS — E785 Hyperlipidemia, unspecified: Secondary | ICD-10-CM | POA: Diagnosis not present

## 2020-08-10 DIAGNOSIS — E663 Overweight: Secondary | ICD-10-CM | POA: Diagnosis not present

## 2020-08-10 DIAGNOSIS — E039 Hypothyroidism, unspecified: Secondary | ICD-10-CM | POA: Diagnosis not present

## 2020-08-10 DIAGNOSIS — I10 Essential (primary) hypertension: Secondary | ICD-10-CM | POA: Diagnosis not present

## 2020-08-31 ENCOUNTER — Encounter: Payer: Self-pay | Admitting: Neurology

## 2020-08-31 ENCOUNTER — Telehealth: Payer: Self-pay | Admitting: *Deleted

## 2020-08-31 ENCOUNTER — Other Ambulatory Visit: Payer: Self-pay

## 2020-08-31 ENCOUNTER — Ambulatory Visit (INDEPENDENT_AMBULATORY_CARE_PROVIDER_SITE_OTHER): Payer: Medicare Other | Admitting: Neurology

## 2020-08-31 VITALS — BP 115/78 | HR 71 | Ht 64.0 in

## 2020-08-31 DIAGNOSIS — H8109 Meniere's disease, unspecified ear: Secondary | ICD-10-CM

## 2020-08-31 DIAGNOSIS — G4700 Insomnia, unspecified: Secondary | ICD-10-CM

## 2020-08-31 DIAGNOSIS — Z79899 Other long term (current) drug therapy: Secondary | ICD-10-CM | POA: Insufficient documentation

## 2020-08-31 DIAGNOSIS — G4733 Obstructive sleep apnea (adult) (pediatric): Secondary | ICD-10-CM

## 2020-08-31 DIAGNOSIS — G35 Multiple sclerosis: Secondary | ICD-10-CM

## 2020-08-31 DIAGNOSIS — R260 Ataxic gait: Secondary | ICD-10-CM

## 2020-08-31 MED ORDER — TRIAMTERENE-HCTZ 37.5-25 MG PO CAPS
1.0000 | ORAL_CAPSULE | Freq: Every day | ORAL | 3 refills | Status: DC
Start: 2020-08-31 — End: 2021-09-06

## 2020-08-31 MED ORDER — FLUOXETINE HCL 20 MG PO CAPS
20.0000 mg | ORAL_CAPSULE | Freq: Every day | ORAL | 4 refills | Status: DC
Start: 2020-08-31 — End: 2021-09-06

## 2020-08-31 MED ORDER — DIAZEPAM 5 MG PO TABS
ORAL_TABLET | ORAL | 1 refills | Status: DC
Start: 2020-08-31 — End: 2021-09-06

## 2020-08-31 MED ORDER — AUBAGIO 14 MG PO TABS: 14.0000 mg | ORAL_TABLET | Freq: Every day | ORAL | 3 refills | Status: DC

## 2020-08-31 NOTE — Telephone Encounter (Signed)
Faxed printed/signed rx Diazepam to [facility] at [phone]. Received fax confirmation.

## 2020-08-31 NOTE — Progress Notes (Signed)
[facility]  PATIENT: Leslie Ryan DOB: Oct 14, 1950  REFERRING CLINICIAN: Burnard Bunting HISTORY FROM: patient REASON FOR VISIT: MS   HISTORICAL  CHIEF COMPLAINT:  Chief Complaint  Patient presents with  . Follow-up    RM 12, alone. Last seen 02/24/20. On Aubagio for MS. ON CPAP/DME: [facility]. Using nasal cannula.     HISTORY OF PRESENT ILLNESS:  Leslie Ryan is a 70 y.o. woman with relapsing remitting multiple sclerosis.    Update 08/31/2020: She is on Aubagio for her RRMS and tolerates it well.   Labs were fine with PCP.   Gait and balance are mildly off same.   She holds the bannister with stairs.   She feels it is stable.  No falls.   Her right leg is slightly weaker than left and she has a foot drop if more tired.   Vision is doing well.    She had cataract surgery.    Bladder is the same with urgency and frequency.    She has Meniere's, much better with a daily diuretic daily (and valium if spell). She just has mild vertigo at times now.    She is on Ambien XR 12.5 for insomnia which helps sleep onset very well and maintenance some.  .   She has no hangover.  Trazodone was stopped due to a hangover.   She is on CPAP for OSA.   Her mask is often uncomfortable and sometimes slips.  D/L shows a 30 day AHI 2.6 and compliance is 93% (100% use but 2/30 nights < 4 hours).      She is doing water aerobics a [facility].    EPWORTH SLEEPINESS SCALE  On a scale of 0 - 3 what is the chance of dozing:  Sitting and Reading:   1 Watching TV:    0 Sitting inactive in a public place: 0 Passenger in car for one hour: 0 Lying down to rest in the afternoon: 0 Sitting and talking to someone: 0 Sitting quietly after lunch:  0 In a car, stopped in traffic:  0  Total (out of 24):   Normal 1/24   MS History:  In the mid 1980s, she presented with right optic neuritis while working at [employer] [facility]. A lumbar puncture was  inconclusive but an MRI of the brain was consistent with MS a few years later. In the 1980s and early 1990s, she received multiple rounds of IV steroids for occasional exacerbations involving vision or gait. In 1994, she started Betaseron stopped quickly due to severe injection site reactions. In January 1997, after an exacerbation she was started on Avonex and continued on Avonex for the next 15 years or so. She had some issues with compliance on some occasions.   In the late 1990s, she began to have more permanent gait issues the right foot drop that slowly worsened. I saw early MRI reports from 2000 showing 2 foci at C6 and C7 on the cervical spine and predominantly periventricular foci on brain MRI.   On 02/20/2013, she had the sudden onset of vertigo that was not altered by movements. Her gait worsened with ataxia and she felt like she was being pushed to the right as she walked. She did not note any numbness or weakness or visual changes. An MRI of the brain on 02/25/2013 showed multiple periventricular white matter lesions consistent with MS. She received 3 days of IV steroids with benefit. I personally reviewed the MRI and  felt that there were 3 new lesions in the left frontal parietal out of that were not present on her prior MRI dated 08/08/2010 the foci enhanced. Due to the exacerbation and the MRI changes, she switched to Aubagio in 2014. She tolerates it well and liver function tests have been fine.  IMAGING: MRI brain 03/06/2020 shows T2 hyperintense foci in the hemispheres and some foci in the pons. Many of the lesions are in the periventricular white matter and some in the juxtacortical white matter, consistent with chronic demyelinating plaque associated with multiple sclerosis. There could be mild superimposed chronic microvascular ischemic change. None of the foci appear to be acute. Compared to the MRI from 06/20/2014, there is no definite changes.  MRI cervical spine 03/06/2020 shows T2  hyperintense focus in the left adjacent to T1.  This is consistent with chronic demyelinating plaque associated with multiple sclerosis.  On the sagittal images, but not clearly seen on axial images, there could be 2 additional small foci adjacent to C2-C3 and C6.      Minimal to mild spinal stenosis at C4-C5 through C6-C7 due to disc bulging, and ligamenta flava hypertrophy.  There is no nerve root compression.  REVIEW OF SYSTEMS:  Constitutional: No fevers, chills, sweats, or change in appetite Eyes: No visual changes, double vision, eye pain Ear, nose and throat: No hearing loss, ear pain, nasal congestion, sore throat Cardiovascular: No chest pain, palpitations Respiratory:  No shortness of breath at rest or with exertion.   No wheezes GastrointestinaI: No nausea, vomiting, diarrhea, abdominal pain, fecal incontinence Genitourinary:  No dysuria, urinary retention or frequency.  No nocturia. Musculoskeletal:  No neck pain, back pain Integumentary: No rash, pruritus, skin lesions Neurological: as above Psychiatric: No depression at this time.  No anxiety Endocrine: No palpitations, diaphoresis, change in appetite, change in weigh or increased thirst.   She has felt cold since thyroid med's changed Hematologic/Lymphatic:  No anemia, purpura, petechiae. Allergic/Immunologic: No itchy/runny eyes, nasal congestion, recent allergic reactions, rashes  ALLERGIES: Allergies  Allergen Reactions  . Erythromycin Other (See Comments)    Abdominal cramping  . Flexeril [Cyclobenzaprine]     Severe constipation  . Propofol Other (See Comments)    Has trouble waking up  . Morphine And Related     Patient described heaviness and loss of feeling in legs coupled with a squeezing feeling in the epigastric region. Requested no further Morphine doses.       HOME MEDICATIONS: Outpatient Medications Prior to Visit  Medication Sig Dispense Refill  . aspirin 81 MG tablet Take 81 mg by mouth daily.    .  Cholecalciferol (VITAMIN D-3) 1000 units CAPS Take 2,000 tablets by mouth daily.    . lansoprazole (PREVACID) 30 MG capsule Take 30 mg by mouth daily.    Marland Kitchen levothyroxine (SYNTHROID, LEVOTHROID) 112 MCG tablet Take 112 mcg by mouth daily.    Marland Kitchen nystatin (MYCOSTATIN/NYSTOP) powder Apply 1 application topically 2 (two) times daily. 15 g 1  . potassium chloride SA (K-DUR,KLOR-CON) 20 MEQ tablet Take 20 mEq by mouth 2 (two) times daily.    . pravastatin (PRAVACHOL) 20 MG tablet Take 20 mg by mouth daily.     . Probiotic Product (PROBIOTIC PO) Take 1 capsule by mouth daily.    . vitamin E 400 UNIT capsule Take 400 Units by mouth daily.    Marland Kitchen zolpidem (AMBIEN CR) 12.5 MG CR tablet Take 1 tablet (12.5 mg total) by mouth at bedtime as needed for sleep.  90 tablet 1  . diazepam (VALIUM) 5 MG tablet One or two po daily 180 tablet 1  . FLUoxetine (PROZAC) 20 MG capsule Take 1 capsule (20 mg total) by mouth daily. 90 capsule 4  . Teriflunomide (AUBAGIO) 14 MG TABS Take 14 mg by mouth daily. 90 tablet 3  . triamterene-hydrochlorothiazide (DYAZIDE) 37.5-25 MG capsule Take 1 each (1 capsule total) by mouth daily. 90 capsule 3   No facility-administered medications prior to visit.    PAST MEDICAL HISTORY: Past Medical History:  Diagnosis Date  . Anxiety   . Celiac disease   . Cervical dysplasia   . Claustrophobia   . Complication of anesthesia    slow to awaken, doesn't want Propofol- when she had it before, had to be reminded to breath.  . Depression   . Endometriosis   . Family history of adverse reaction to anesthesia    mother difficulty intubation  . GERD (gastroesophageal reflux disease)   . Hearing loss   . High cholesterol   . Hypothyroidism   . Multiple sclerosis ([place])   . PONV (postoperative nausea and vomiting)   . Vision abnormalities     PAST SURGICAL HISTORY: Past Surgical History:  Procedure Laterality Date  . [place] VITRECTOMY WITH 20 GAUGE MVR PORT FOR MACULAR HOLE  Right 05/06/2018  . 25 GAUGE PARS PLANA VITRECTOMY WITH 20 GAUGE MVR PORT FOR MACULAR HOLE Right 05/06/2018   Procedure: 25 GAUGE PARS PLANA VITRECTOMY WITH 20 GAUGE MVR PORT FOR MACULAR HOLE;  Surgeon: Hayden Pedro, MD;  Location: [place];  Service: Ophthalmology;  Laterality: Right;  . ABDOMINAL HYSTERECTOMY  1999   TAH,BSO menorrhgia,,endometriosis  . CHOLECYSTECTOMY    . COLONOSCOPY    . COLPOSCOPY    . FINGER SURGERY Right    5 finger   . GAS/FLUID EXCHANGE Right 05/06/2018   Procedure: GAS/FLUID EXCHANGE C3F8;  Surgeon: Hayden Pedro, MD;  Location: [place];  Service: Ophthalmology;  Laterality: Right;  . GYNECOLOGIC CRYOSURGERY  1987  . LAPAROSCOPY    . MEMBRANE PEEL Right 05/06/2018   Procedure: MEMBRANE PEEL;  Surgeon: Hayden Pedro, MD;  Location: [place];  Service: Ophthalmology;  Laterality: Right;  . OOPHORECTOMY    . PHOTOCOAGULATION WITH LASER Right 05/06/2018   Procedure: PHOTOCOAGULATION WITH LASER;  Surgeon: Hayden Pedro, MD;  Location: [place];  Service: Ophthalmology;  Laterality: Right;  . ROTATOR CUFF REPAIR Right   . VITRECTOMY      FAMILY HISTORY: Family History  Problem Relation Age of Onset  . Breast cancer Mother        age 24's  . Cancer Mother        UTERINE  . Colon cancer Mother   . Diabetes Father   . Hypertension Father   . Heart disease Father   . Cancer Maternal Grandfather        Stomach cancer  . Heart disease Paternal Grandfather     SOCIAL HISTORY:  Social History   Socioeconomic History  . Marital status: Married    Spouse name: Not on file  . Number of children: Not on file  . Years of education: Not on file  . Highest education level: Not on file  Occupational History  . Not on file  Tobacco Use  . Smoking status: Former Smoker    Years: 15.00  . Smokeless tobacco: Never Used  . Tobacco comment: 05/05/18- quit at least 15 years ago  Vaping Use  .  Vaping Use: Never used  Substance and Sexual Activity  . Alcohol use:  No  . Drug use: No  . Sexual activity: Not Currently    Birth control/protection: Surgical  Other Topics Concern  . Not on file  Social History Narrative  . Not on file   Social Determinants of Health   Financial Resource Strain: Not on file  Food Insecurity: Not on file  Transportation Needs: Not on file  Physical Activity: Not on file  Stress: Not on file  Social Connections: Not on file  Intimate Partner Violence: Not on file     PHYSICAL EXAM  Vitals:   08/31/20 1256  BP: 115/78  Pulse: 71  SpO2: 95%  Height: 5' 4"  (1.626 m)    Body mass index is 30.55 kg/m.   General: The patient is well-developed and well-nourished and in no acute distress  Neck:.  The neck is nontender with good ROM   Neurologic Exam  Mental status: The patient is alert and oriented x 3 at the time of the examination. The patient has apparent normal recent and remote memory, with an apparently normal attention span and concentration ability.   Speech is normal.  Cranial nerves: Extraocular movements are full.  Facial strength and sensation is normal. Trapezius strength is normal.. No dysarthria is noted.   Motor: Muscle bulk is normal.  Muscle tone is increased.  Strength is 5/5.  Sensory: She has intact touch and vibration sensation in the arms and legs.  Coordination: Finger-nose-finger and heel-to-shin is performed well..  Gait and station: The station is normal.  Gait is mildly wide with a slight right foot drop.   Tandem is wide.  Romberg is negative.  Reflexes: Deep tendon reflexes are symmetric and normal bilaterally.      DIAGNOSTIC DATA (LABS, IMAGING, TESTING) - I reviewed patient records, labs, notes, testing and imaging myself where available.  Lab Results  Component Value Date   WBC 4.3 08/24/2019   HGB 14.8 08/24/2019   HCT 46.5 08/24/2019   MCV 87 08/24/2019   PLT 293 08/24/2019      Component Value Date/Time   NA 135 08/24/2019 0950   K 3.9 08/24/2019 0950    CL 94 (L) 08/24/2019 0950   CO2 26 08/24/2019 0950   GLUCOSE 94 08/24/2019 0950   GLUCOSE 94 05/06/2018 0945   BUN 11 08/24/2019 0950   CREATININE 0.83 08/24/2019 0950   CALCIUM 9.9 08/24/2019 0950   PROT 6.8 08/24/2019 0950   ALBUMIN 4.4 08/24/2019 0950   AST 23 08/24/2019 0950   ALT 26 08/24/2019 0950   ALKPHOS 72 08/24/2019 0950   BILITOT 0.4 08/24/2019 0950   GFRNONAA 72 08/24/2019 0950   GFRAA 83 08/24/2019 0950        ASSESSMENT AND PLAN  Multiple sclerosis (HCC) - Plan: CBC with Differential/Platelet, Hepatic function panel, Teriflunomide (AUBAGIO) 14 MG TABS  High risk medication use - Plan: CBC with Differential/Platelet, Hepatic function panel  Meniere's disease, unspecified laterality  OSA (obstructive sleep apnea)  Insomnia, unspecified type  Ataxic gait   1.   Continue Aubagio for MS.  Check labs.    2.   Continue medications for spasticity (valium), insomnia (ambien), mood   Use CPAP nightly  3.   Continue diuretic for Meniere's. 4.    stay active and exercise as tolerated. 5.    Return in 6 months or sooner if there are new or worsening    Leslie Ryan A. Felecia Shelling, MD, PhD 08/31/2020,  6:12 PM Certified in Neurology, Clinical Neurophysiology, Sleep Medicine, Pain Medicine and Neuroimaging  [facility] [address] [place], [place] [address] [phone]

## 2020-09-01 LAB — CBC WITH DIFFERENTIAL/PLATELET
Basophils Absolute: 0 10*3/uL (ref 0.0–0.2)
Basos: 1 %
EOS (ABSOLUTE): 0.1 10*3/uL (ref 0.0–0.4)
Eos: 2 %
Hematocrit: 44.1 % (ref 34.0–46.6)
Hemoglobin: 14.5 g/dL (ref 11.1–15.9)
Immature Grans (Abs): 0 10*3/uL (ref 0.0–0.1)
Immature Granulocytes: 0 %
Lymphocytes Absolute: 1.4 10*3/uL (ref 0.7–3.1)
Lymphs: 37 %
MCH: 28.2 pg (ref 26.6–33.0)
MCHC: 32.9 g/dL (ref 31.5–35.7)
MCV: 86 fL (ref 79–97)
Monocytes Absolute: 0.5 10*3/uL (ref 0.1–0.9)
Monocytes: 13 %
Neutrophils Absolute: 1.8 10*3/uL (ref 1.4–7.0)
Neutrophils: 47 %
Platelets: 257 10*3/uL (ref 150–450)
RBC: 5.15 x10E6/uL (ref 3.77–5.28)
RDW: 12.7 % (ref 11.7–15.4)
WBC: 3.8 10*3/uL (ref 3.4–10.8)

## 2020-09-01 LAB — HEPATIC FUNCTION PANEL
ALT: 21 IU/L (ref 0–32)
AST: 19 IU/L (ref 0–40)
Albumin: 4.6 g/dL (ref 3.8–4.8)
Alkaline Phosphatase: 70 IU/L (ref 44–121)
Bilirubin Total: 0.4 mg/dL (ref 0.0–1.2)
Bilirubin, Direct: 0.15 mg/dL (ref 0.00–0.40)
Total Protein: 6.8 g/dL (ref 6.0–8.5)

## 2020-09-12 ENCOUNTER — Telehealth: Payer: Self-pay | Admitting: Neurology

## 2020-09-12 NOTE — Telephone Encounter (Signed)
Called and spoke with pt. Advised Dr. Felecia Shelling sent her mychart message on 09/01/20: " Lab work looks normal." She states she does not use Pharmacist, community. Asked it be deactivated. I did this per pt request.

## 2020-09-12 NOTE — Telephone Encounter (Signed)
Pt. is calling for lab results. Please advise.

## 2020-09-23 ENCOUNTER — Telehealth: Payer: Self-pay | Admitting: Neurology

## 2020-09-23 DIAGNOSIS — G4733 Obstructive sleep apnea (adult) (pediatric): Secondary | ICD-10-CM

## 2020-09-23 DIAGNOSIS — R5382 Chronic fatigue, unspecified: Secondary | ICD-10-CM

## 2020-09-23 NOTE — Telephone Encounter (Signed)
Pt called, would like to get the inspire under the skin. Having problems with using the CPAP machine

## 2020-09-29 NOTE — Telephone Encounter (Signed)
Called pt. Relayed Dr. Garth Bigness message. Pt agreeable to this plan. Advised we will place referral and their office will call her to make appt. Asked her to call back if she does not hear about making appt in the next week or so. Pt verbalized understanding and appreciation.

## 2020-09-29 NOTE — Telephone Encounter (Signed)
Pt. called to get an update for inspire before our office is closed tomorrow.

## 2020-09-29 NOTE — Telephone Encounter (Signed)
I took a look at her records.  She would likely qualify for the inspire device.  She would need to see either Dr. Redmond Baseman or Wilburn Cornelia at [facility] (they have an [facility] office)  If she is interested, we can send in the referral  'Severe OSA with an AHI = 52 for inspire device consult'

## 2020-09-29 NOTE — Addendum Note (Signed)
Addended by: Wyvonnia Lora on: 09/29/2020 03:46 PM   Modules accepted: Orders

## 2020-12-08 ENCOUNTER — Other Ambulatory Visit: Payer: Self-pay | Admitting: Nurse Practitioner

## 2020-12-08 DIAGNOSIS — B372 Candidiasis of skin and nail: Secondary | ICD-10-CM

## 2020-12-08 NOTE — Telephone Encounter (Signed)
AEX 07/20/20.

## 2021-02-17 ENCOUNTER — Encounter (INDEPENDENT_AMBULATORY_CARE_PROVIDER_SITE_OTHER): Payer: Medicare Other | Admitting: Ophthalmology

## 2021-03-07 ENCOUNTER — Other Ambulatory Visit: Payer: Self-pay

## 2021-03-07 ENCOUNTER — Encounter: Payer: Self-pay | Admitting: Neurology

## 2021-03-07 ENCOUNTER — Ambulatory Visit (INDEPENDENT_AMBULATORY_CARE_PROVIDER_SITE_OTHER): Payer: Medicare Other | Admitting: Neurology

## 2021-03-07 VITALS — BP 138/82 | HR 85 | Ht 63.0 in | Wt 185.0 lb

## 2021-03-07 DIAGNOSIS — G4733 Obstructive sleep apnea (adult) (pediatric): Secondary | ICD-10-CM

## 2021-03-07 DIAGNOSIS — G4700 Insomnia, unspecified: Secondary | ICD-10-CM

## 2021-03-07 DIAGNOSIS — H8109 Meniere's disease, unspecified ear: Secondary | ICD-10-CM

## 2021-03-07 DIAGNOSIS — G35 Multiple sclerosis: Secondary | ICD-10-CM | POA: Diagnosis not present

## 2021-03-07 DIAGNOSIS — Z79899 Other long term (current) drug therapy: Secondary | ICD-10-CM

## 2021-03-07 DIAGNOSIS — H6121 Impacted cerumen, right ear: Secondary | ICD-10-CM | POA: Insufficient documentation

## 2021-03-07 DIAGNOSIS — H6992 Unspecified Eustachian tube disorder, left ear: Secondary | ICD-10-CM | POA: Insufficient documentation

## 2021-03-07 DIAGNOSIS — R5382 Chronic fatigue, unspecified: Secondary | ICD-10-CM | POA: Diagnosis not present

## 2021-03-07 MED ORDER — ZOLPIDEM TARTRATE ER 12.5 MG PO TBCR: 12.5000 mg | EXTENDED_RELEASE_TABLET | Freq: Every evening | ORAL | 0 refills | Status: DC | PRN

## 2021-03-07 MED ORDER — ZOLPIDEM TARTRATE ER 12.5 MG PO TBCR
12.5000 mg | EXTENDED_RELEASE_TABLET | Freq: Every evening | ORAL | 0 refills | Status: DC | PRN
Start: 2021-03-07 — End: 2021-03-07

## 2021-03-07 NOTE — Progress Notes (Signed)
[facility]  PATIENT: Leslie Ryan DOB: 08-05-1951  REFERRING CLINICIAN: Burnard Bunting HISTORY FROM: patient   REASON FOR VISIT: MS   HISTORICAL  CHIEF COMPLAINT:  Chief Complaint  Patient presents with   Follow-up    Pt alone, rm 1. Pt doing well. On aubagio. CPAP well/ [facility]    HISTORY OF PRESENT ILLNESS:  Leslie Ryan is a 70 y.o. woman with relapsing remitting multiple sclerosis.    Update 03/07/2021: She is on Aubagio for her RRMS and tolerates it well.   She denies exacerbation or new neurologic symptoms.     Gait and balance are doing about the same with some stumbling bu no falls. .   She holds the bannister with stairs.    Her right leg is slightly weaker than left and she has a foot drop if more tired.   Vision is doing well.     Bladder is the same with urgency and frequency.    She has Meniere's, much better with a daily Dyazide daily (and valium if spell). She just has mild vertigo at times now.  She also sees D. Rosen.  She states she was told she had fluid behind her ear.    She is on Ambien XR 12.5 for insomnia with benefit for sleep onset > maintenance    She has no hangover on Ambien but trazodone and other medications caused that. .   She is on CPAP for OSA.   Her mask is often uncomfortable and sometimes slips while asleep.  D/L shows a 30 day AHI 2.3 and compliance is 70% (100% use but 9/30 nights < 4 hours).      She is doing water aerobics a [facility].    She has lumbar DJD.     EPWORTH SLEEPINESS SCALE  On a scale of 0 - 3 what is the chance of dozing:  Sitting and Reading:   1 Watching TV:    2 Sitting inactive in a public place: 0 Passenger in car for one hour: 0 Lying down to rest in the afternoon: 1 Sitting and talking to someone: 0 Sitting quietly after lunch:  0 In a car, stopped in traffic:  0  Total (out of 24):   Normal 4/24   MS History:  In the mid 1980s, she presented with right  optic neuritis while working at [employer]. A lumbar puncture was inconclusive but an MRI of the brain was consistent with MS a few years later. In the 1980s and early 1990s, she received multiple rounds of IV steroids for occasional exacerbations involving vision or gait. In 1994, she started Betaseron stopped quickly due to severe injection site reactions. In January 1997, after an exacerbation she was started on Avonex and continued on Avonex for the next 15 years or so. She had some issues with compliance on some occasions.   In the late 1990s, she began to have more permanent gait issues the right foot drop that slowly worsened. I saw early MRI reports from 2000 showing 2 foci at C6 and C7 on the cervical spine and predominantly periventricular foci on brain MRI.   On 02/20/2013, she had the sudden onset of vertigo that was not altered by movements. Her gait worsened with ataxia and she felt like she was being pushed to the right as she walked. She did not note any numbness or weakness or visual changes. An MRI of the brain on 02/25/2013 showed multiple periventricular white  matter lesions consistent with MS. She received 3 days of IV steroids with benefit. I personally reviewed the MRI and felt that there were 3 new lesions in the left frontal parietal out of that were not present on her prior MRI dated 08/08/2010 the foci enhanced. Due to the exacerbation and the MRI changes, she switched to Aubagio in 2014. She tolerates it well and liver function tests have been fine.  IMAGING: MRI brain 03/06/2020 shows T2 hyperintense foci in the hemispheres and some foci in the pons. Many of the lesions are in the periventricular white matter and some in the juxtacortical white matter, consistent with chronic demyelinating plaque associated with multiple sclerosis. There could be mild superimposed chronic microvascular ischemic change. None of the foci appear to be acute. Compared to the MRI from 06/20/2014,  there is no definite changes.  MRI cervical spine 03/06/2020 shows T2 hyperintense focus in the left adjacent to T1.  This is consistent with chronic demyelinating plaque associated with multiple sclerosis.  On the sagittal images, but not clearly seen on axial images, there could be 2 additional small foci adjacent to C2-C3 and C6.      Minimal to mild spinal stenosis at C4-C5 through C6-C7 due to disc bulging, and ligamenta flava hypertrophy.  There is no nerve root compression.  REVIEW OF SYSTEMS:  Constitutional: No fevers, chills, sweats, or change in appetite Eyes: No visual changes, double vision, eye pain Ear, nose and throat: No hearing loss, ear pain, nasal congestion, sore throat Cardiovascular: No chest pain, palpitations Respiratory:  No shortness of breath at rest or with exertion.   No wheezes GastrointestinaI: No nausea, vomiting, diarrhea, abdominal pain, fecal incontinence Genitourinary:  No dysuria, urinary retention or frequency.  No nocturia. Musculoskeletal:  No neck pain, back pain Integumentary: No rash, pruritus, skin lesions Neurological: as above Psychiatric: No depression at this time.  No anxiety Endocrine: No palpitations, diaphoresis, change in appetite, change in weigh or increased thirst.   She has felt cold since thyroid med's changed Hematologic/Lymphatic:  No anemia, purpura, petechiae. Allergic/Immunologic: No itchy/runny eyes, nasal congestion, recent allergic reactions, rashes  ALLERGIES: Allergies  Allergen Reactions   Erythromycin Other (See Comments)    Abdominal cramping   Flexeril [Cyclobenzaprine]     Severe constipation   Propofol Other (See Comments)    Has trouble waking up   Morphine And Related     Patient described heaviness and loss of feeling in legs coupled with a squeezing feeling in the epigastric region. Requested no further Morphine doses.       HOME MEDICATIONS: Outpatient Medications Prior to Visit  Medication Sig Dispense  Refill   aspirin 81 MG tablet Take 81 mg by mouth daily.     Cholecalciferol (VITAMIN D-3) 1000 units CAPS Take 2,000 tablets by mouth daily.     diazepam (VALIUM) 5 MG tablet One or two po daily 180 tablet 1   FLUoxetine (PROZAC) 20 MG capsule Take 1 capsule (20 mg total) by mouth daily. 90 capsule 4   lansoprazole (PREVACID) 30 MG capsule Take 30 mg by mouth daily.     levothyroxine (SYNTHROID, LEVOTHROID) 112 MCG tablet Take 112 mcg by mouth daily.     NYSTATIN powder Apply 1 application topically 2 (two) times daily. 15 g 1   potassium chloride SA (K-DUR,KLOR-CON) 20 MEQ tablet Take 20 mEq by mouth 2 (two) times daily.     pravastatin (PRAVACHOL) 20 MG tablet Take 20 mg by mouth daily.  Probiotic Product (PROBIOTIC PO) Take 1 capsule by mouth daily.     Teriflunomide (AUBAGIO) 14 MG TABS Take 14 mg by mouth daily. 90 tablet 3   triamterene-hydrochlorothiazide (DYAZIDE) 37.5-25 MG capsule Take 1 each (1 capsule total) by mouth daily. 90 capsule 3   vitamin E 400 UNIT capsule Take 400 Units by mouth daily.     zolpidem (AMBIEN CR) 12.5 MG CR tablet Take 1 tablet (12.5 mg total) by mouth at bedtime as needed for sleep. 90 tablet 1   No facility-administered medications prior to visit.    PAST MEDICAL HISTORY: Past Medical History:  Diagnosis Date   Anxiety    Celiac disease    Cervical dysplasia    Claustrophobia    Complication of anesthesia    slow to awaken, doesn't want Propofol- when she had it before, had to be reminded to breath.   Depression    Endometriosis    Family history of adverse reaction to anesthesia    mother difficulty intubation   GERD (gastroesophageal reflux disease)    Hearing loss    High cholesterol    Hypothyroidism    Multiple sclerosis (HCC)    PONV (postoperative nausea and vomiting)    Vision abnormalities     PAST SURGICAL HISTORY: Past Surgical History:  Procedure Laterality Date   25 GAUGE PARS PLANA VITRECTOMY WITH 20 GAUGE MVR PORT  FOR MACULAR HOLE Right 05/06/2018   25 GAUGE PARS PLANA VITRECTOMY WITH 20 GAUGE MVR PORT FOR MACULAR HOLE Right 05/06/2018   Procedure: 25 GAUGE PARS PLANA VITRECTOMY WITH 20 GAUGE MVR PORT FOR MACULAR HOLE;  Surgeon: Hayden Pedro, MD;  Location: [facility];  Service: Ophthalmology;  Laterality: Right;   ABDOMINAL HYSTERECTOMY  1999   TAH,BSO menorrhgia,,endometriosis   CHOLECYSTECTOMY     COLONOSCOPY     COLPOSCOPY     FINGER SURGERY Right    5 finger    GAS/FLUID EXCHANGE Right 05/06/2018   Procedure: GAS/FLUID EXCHANGE C3F8;  Surgeon: Hayden Pedro, MD;  Location: [facility];  Service: Ophthalmology;  Laterality: Right;   GYNECOLOGIC CRYOSURGERY  1987   LAPAROSCOPY     MEMBRANE PEEL Right 05/06/2018   Procedure: MEMBRANE PEEL;  Surgeon: Hayden Pedro, MD;  Location: [facility];  Service: Ophthalmology;  Laterality: Right;   OOPHORECTOMY     PHOTOCOAGULATION WITH LASER Right 05/06/2018   Procedure: PHOTOCOAGULATION WITH LASER;  Surgeon: Hayden Pedro, MD;  Location: [facility];  Service: Ophthalmology;  Laterality: Right;   ROTATOR CUFF REPAIR Right    VITRECTOMY      FAMILY HISTORY: Family History  Problem Relation Age of Onset   Breast cancer Mother        age 71's   Cancer Mother        UTERINE   Colon cancer Mother    Diabetes Father    Hypertension Father    Heart disease Father    Cancer Maternal Grandfather        Stomach cancer   Heart disease Paternal Grandfather     SOCIAL HISTORY:  Social History   Socioeconomic History   Marital status: Married    Spouse name: Not on file   Number of children: Not on file   Years of education: Not on file   Highest education level: Not on file  Occupational History   Not on file  Tobacco Use   Smoking status: Former    Years: 15.00    Types: Cigarettes  Smokeless tobacco: Never   Tobacco comments:    05/05/18- quit at least 15 years ago  Vaping Use   Vaping Use: Never used  Substance and Sexual Activity   Alcohol use:  No   Drug use: No   Sexual activity: Not Currently    Birth control/protection: Surgical  Other Topics Concern   Not on file  Social History Narrative   Not on file   Social Determinants of Health   Financial Resource Strain: Not on file  Food Insecurity: Not on file  Transportation Needs: Not on file  Physical Activity: Not on file  Stress: Not on file  Social Connections: Not on file  Intimate Partner Violence: Not on file     PHYSICAL EXAM  Vitals:   03/07/21 1250  BP: 138/82  Pulse: 85  Weight: 185 lb (83.9 kg)  Height: 5' 3"  (1.6 m)    Body mass index is 32.77 kg/m.   General: The patient is well-developed and well-nourished and in no acute distress  Neck:.  The neck is nontender with good ROM   Neurologic Exam  Mental status: The patient is alert and oriented x 3 at the time of the examination. The patient has apparent normal recent and remote memory, with an apparently normal attention span and concentration ability.   Speech is normal.  Cranial nerves: Extraocular movements are full.  Facial strength and sensation is normal. Trapezius strength is normal.. No dysarthria is noted. Mildly reduced hearing on the left.    Motor: Muscle bulk is normal.  Muscle tone is increased.  Strength is 5/5.  Sensory: She has intact touch and vibration sensation in the arms and legs.  Coordination: Finger-nose-finger and heel-to-shin is performed well..  Gait and station: The station is normal.  Gait is mildly wide with a slight right foot drop.   Tandem gait is wide Romberg is negative.  Reflexes: Deep tendon reflexes are symmetric and normal bilaterally.      DIAGNOSTIC DATA (LABS, IMAGING, TESTING) - I reviewed patient records, labs, notes, testing and imaging myself where available.  Lab Results  Component Value Date   WBC 3.8 08/31/2020   HGB 14.5 08/31/2020   HCT 44.1 08/31/2020   MCV 86 08/31/2020   PLT 257 08/31/2020      Component Value Date/Time    NA 135 08/24/2019 0950   K 3.9 08/24/2019 0950   CL 94 (L) 08/24/2019 0950   CO2 26 08/24/2019 0950   GLUCOSE 94 08/24/2019 0950   GLUCOSE 94 05/06/2018 0945   BUN 11 08/24/2019 0950   CREATININE 0.83 08/24/2019 0950   CALCIUM 9.9 08/24/2019 0950   PROT 6.8 08/31/2020 1340   ALBUMIN 4.6 08/31/2020 1340   AST 19 08/31/2020 1340   ALT 21 08/31/2020 1340   ALKPHOS 70 08/31/2020 1340   BILITOT 0.4 08/31/2020 1340   GFRNONAA 72 08/24/2019 0950   GFRAA 83 08/24/2019 0950        ASSESSMENT AND PLAN  Multiple sclerosis (HCC)  OSA (obstructive sleep apnea)  Chronic fatigue  High risk medication use  Meniere's disease, unspecified laterality  Insomnia, unspecified type   1.   Continue Aubagio for MS.  PCP will be checking labs in a couple weeks    2.   Continue medications for spasticity (valium), insomnia (Ambien), mood , Meniers (Dyazide).   Use CPAP nightly    Since Ambien script just sent to mail order will send a small supply to local pharmacy.   3.  stay active and exercise as tolerated. 4.     Return in 6 months or sooner if there are new or worsening    Leslie Ryan A. Felecia Shelling, MD, PhD 3/86/8548, 8:30 PM Certified in Neurology, Clinical Neurophysiology, Sleep Medicine, Pain Medicine and Neuroimaging  [facility] [address] [place], [place] [address] [phone]

## 2021-03-13 ENCOUNTER — Other Ambulatory Visit: Payer: Self-pay | Admitting: Gastroenterology

## 2021-03-13 DIAGNOSIS — Z86010 Personal history of colonic polyps: Secondary | ICD-10-CM

## 2021-03-16 ENCOUNTER — Telehealth: Payer: Self-pay | Admitting: Neurology

## 2021-03-16 MED ORDER — ZOLPIDEM TARTRATE ER 12.5 MG PO TBCR: 12.5000 mg | EXTENDED_RELEASE_TABLET | Freq: Every evening | ORAL | 0 refills | Status: DC | PRN

## 2021-03-16 MED ORDER — ZOLPIDEM TARTRATE ER 12.5 MG PO TBCR: 12.5000 mg | EXTENDED_RELEASE_TABLET | Freq: Every evening | ORAL | 1 refills | Status: DC | PRN

## 2021-03-16 NOTE — Telephone Encounter (Signed)
Called pt  back. Got #10 ambien from [facility]. MD was supposed to send rx to [facility] as well but they did not receive. She is going out of town until 03/26/21. Would like another short term supply to [facility] and then maintenance rx to [facility]. I placed her on hold and spoke w/ MD. He approved sending #20 to [facility] and we will send maintenance rx to [facility] on Monday. Pt verbalized understanding and appreciation.

## 2021-03-16 NOTE — Telephone Encounter (Signed)
Pritesh Patel from [employer] called wanting to know if the provider can send a prescription for the pt's ptszolpidem (AMBIEN CR) 12.5 MG CR tablet. Pt is needing going out of town and she will not be able to get it from the [place] on time and is wanting to pick it up this time from her local pharmacy. Please advise.

## 2021-03-16 NOTE — Telephone Encounter (Signed)
Pt called would like to speak with nurse regarding prescription for .zolpidem (AMBIEN CR) 12.5 MG CR tablet with [facility]. Going out of town will need prescription send to a Management [facility].

## 2021-03-20 NOTE — Telephone Encounter (Signed)
Faxed rx Ambien 12.48m #90, 1 refill to [facility] at [phone] Received fax confirmation.

## 2021-04-03 ENCOUNTER — Other Ambulatory Visit: Payer: Self-pay | Admitting: Gastroenterology

## 2021-04-03 DIAGNOSIS — Z86010 Personal history of colonic polyps: Secondary | ICD-10-CM

## 2021-04-19 ENCOUNTER — Encounter (INDEPENDENT_AMBULATORY_CARE_PROVIDER_SITE_OTHER): Admitting: Ophthalmology

## 2021-05-09 ENCOUNTER — Encounter (INDEPENDENT_AMBULATORY_CARE_PROVIDER_SITE_OTHER): Admitting: Ophthalmology

## 2021-05-11 ENCOUNTER — Telehealth: Payer: Self-pay | Admitting: Neurology

## 2021-05-11 MED ORDER — METHYLPREDNISOLONE 4 MG PO TBPK: ORAL_TABLET | ORAL | 0 refills | Status: DC

## 2021-05-11 NOTE — Telephone Encounter (Signed)
Called pt back. Advised Dr. Felecia Shelling agrees that he does not feel this is her MS. Thinks it would be best for her to see ortho for this. Until she sees them, offering to call in low dose steroid pack to see if this helps. Pt agrees to this plan. E-scribed rx to [facility], [place]. She will call back if any further questions arise.

## 2021-05-11 NOTE — Telephone Encounter (Signed)
Called pt. She reports she has severe pain in right leg where it connects to torso that started this week. Does not think it is her appendix. Did not eat anything abnormal. Denies any signs of infection/injury/has not started any new meds recently. Received new covid booster today. Denies missing any doses of Aubagio. Cannot get in to see Orthopaedics until 05/31/21. Advised I did not think this was r/t to her MS but would run it by Dr. Felecia Shelling and call her back.

## 2021-05-11 NOTE — Telephone Encounter (Signed)
Pt states her legs give her sever pain and then give out, pt asking for a call to discuss this with Dr Felecia Shelling

## 2021-06-08 ENCOUNTER — Encounter (INDEPENDENT_AMBULATORY_CARE_PROVIDER_SITE_OTHER): Admitting: Ophthalmology

## 2021-06-08 ENCOUNTER — Inpatient Hospital Stay: Admission: RE | Admit: 2021-06-08 | Payer: Medicare Other | Source: Ambulatory Visit

## 2021-06-08 ENCOUNTER — Other Ambulatory Visit: Payer: Self-pay

## 2021-06-08 DIAGNOSIS — H43812 Vitreous degeneration, left eye: Secondary | ICD-10-CM

## 2021-06-08 DIAGNOSIS — H353121 Nonexudative age-related macular degeneration, left eye, early dry stage: Secondary | ICD-10-CM | POA: Diagnosis not present

## 2021-06-08 DIAGNOSIS — H35341 Macular cyst, hole, or pseudohole, right eye: Secondary | ICD-10-CM | POA: Diagnosis not present

## 2021-09-05 NOTE — Patient Instructions (Addendum)
Below is our plan:  We will continue current treatment plan. Keep working on weight loss efforts. You are doing great. If you reach your goal, let me know and we will repeat home sleep study. Let me know if you would like to see ENT for consideration for Inspire.   Please make sure you are staying well hydrated. I recommend 50-60 ounces daily. Well balanced diet and regular exercise encouraged. Consistent sleep schedule with 6-8 hours recommended.   Please continue follow up with care team as directed.   Follow up with Dr Felecia Shelling in 6 months  You may receive a survey regarding today's visit. I encourage you to leave honest feed back as I do use this information to improve patient care. Thank you for seeing me today!

## 2021-09-05 NOTE — Progress Notes (Signed)
Chief Complaint  Patient presents with   Obstructive Sleep Apnea   Follow-up    Rm 1, alone. Here for 6 month MS and CPAP f/u. Pt is on Aubagio for MS and tolerating well. Pt is not adjusting to well to her CPAP. Would like to discuss inspire.      HISTORY OF PRESENT ILLNESS:  09/06/21 ALL:  Leslie Ryan is a 71 y.o. female here today for follow up for RRMS. She continues Aubagio and tolerating well. MRI brain stable 02/2020, cervical imaging showed possibility of 2 new lesions adjacent to C2-C3 and C6. Labs have been stable.   She feels that she is doing well with her MS. She denies changes in her gait or balance, changes in bowel or bladder, or vision. She denies any new or worsening muscle weakness.She remains on Diazepam 5-64m daily as needed for spasms. 90 tablets, last filled in April. She continues to have occasional foot drop on the left loot with prolonged ambulation.   She remains on Dyazide 37.5-25 MG capsule for the Mnire's. Mood is stable on fluoxetine 284mdaily.   She is sleeping well. Ambien CR 12.41m59maily at bedtime helps initiate sleep without feeling poorly the next morning.    She continues vitamin D 2000iu daily for vitamin deficiency.  CPAP: Her compliance report below demonstrates good compliance of 97% (29/30 days), >4 hours is 67% of the time. Her AHI is 3.3 on a pressure setting of 10 cmH2O.She endorses that she is unable to tolerate the face mask and has tried nasal pillows and other face mask. She is interested in the inspire device. ESS 0. Patient has lost 20 lbs and is planning to lose 20 more. Encouraged patient to consider completing a new sleep study after losing more weight since this may significantly help with her OSA.   HISTORY (copied from Dr SatGarth Bignessevious note)  Leslie Ryan a [age]o. woman with relapsing remitting multiple sclerosis.     Update 03/07/2021: She is on Aubagio for her RRMS and tolerates it well.   She denies  exacerbation or new neurologic symptoms.      Gait and balance are doing about the same with some stumbling bu no falls. .   She holds the bannister with stairs.    Her right leg is slightly weaker than left and she has a foot drop if more tired.   Vision is doing well.     Bladder is the same with urgency and frequency.     She has Meniere's, much better with a daily Dyazide daily (and valium if spell). She just has mild vertigo at times now.  She also sees D. Rosen.  She states she was told she had fluid behind her ear.     She is on Ambien XR 12.5 for insomnia with benefit for sleep onset > maintenance    She has no hangover on Ambien but trazodone and other medications caused that. .    She is on CPAP for OSA.   Her mask is often uncomfortable and sometimes slips while asleep.  D/L shows a 30 day AHI 2.3 and compliance is 70% (100% use but 9/30 nights < 4 hours).       [facility]  She has lumbar DJD.      EPWORTH SLEEPINESS SCALE   On a scale of 0 - 3 what is the chance of dozing:  Sitting and Reading:                           1 Watching TV:                                      2 Sitting inactive in a public place:        0 Passenger in car for one hour:           0 Lying down to rest in the afternoon:   1 Sitting and talking to someone:          0 Sitting quietly after lunch:                   0 In a car, stopped in traffic:                  0   Total (out of 24):   Normal 4/24     MS History:  In the mid 1980s, she presented with right optic neuritis while working at [employer] [facility]. A lumbar puncture was inconclusive but an MRI of the brain was consistent with MS a few years later. In the 1980s and early 1990s, she received multiple rounds of IV steroids for occasional exacerbations involving vision or gait. In 1994, she started Betaseron stopped quickly due to severe injection site reactions. In January 1997, after an exacerbation  she was started on Avonex and continued on Avonex for the next 15 years or so. She had some issues with compliance on some occasions.   In the late 1990s, she began to have more permanent gait issues the right foot drop that slowly worsened. I saw early MRI reports from 2000 showing 2 foci at C6 and C7 on the cervical spine and predominantly periventricular foci on brain MRI.   On 02/20/2013, she had the sudden onset of vertigo that was not altered by movements. Her gait worsened with ataxia and she felt like she was being pushed to the right as she walked. She did not note any numbness or weakness or visual changes. An MRI of the brain on 02/25/2013 showed multiple periventricular white matter lesions consistent with MS. She received 3 days of IV steroids with benefit. I personally reviewed the MRI and felt that there were 3 new lesions in the left frontal parietal out of that were not present on her prior MRI dated 08/08/2010 the foci enhanced. Due to the exacerbation and the MRI changes, she switched to Aubagio in 2014. She tolerates it well and liver function tests have been fine.   IMAGING: MRI brain 03/06/2020 shows T2 hyperintense foci in the hemispheres and some foci in the pons. Many of the lesions are in the periventricular white matter and some in the juxtacortical white matter, consistent with chronic demyelinating plaque associated with multiple sclerosis. There could be mild superimposed chronic microvascular ischemic change. None of the foci appear to be acute. Compared to the MRI from 06/20/2014, there is no definite changes.   MRI cervical spine 03/06/2020 shows T2 hyperintense focus in the left adjacent to T1.  This is consistent with chronic demyelinating plaque associated with multiple sclerosis.  On the sagittal images, but not clearly seen on axial images, there could be 2 additional small foci adjacent to C2-C3 and C6.      Minimal to mild spinal stenosis  at C4-C5 through C6-C7 due to disc  bulging, and ligamenta flava hypertrophy.  There is no nerve root compression.   REVIEW OF SYSTEMS: Out of a complete 14 system review of symptoms, the patient complains only of the following symptoms, and all other reviewed systems are negative.   ALLERGIES: Allergies  Allergen Reactions   Erythromycin Other (See Comments)    Abdominal cramping   Flexeril [Cyclobenzaprine]     Severe constipation   Propofol Other (See Comments)    Has trouble waking up   Morphine And Related     Patient described heaviness and loss of feeling in legs coupled with a squeezing feeling in the epigastric region. Requested no further Morphine doses.        HOME MEDICATIONS: Outpatient Medications Prior to Visit  Medication Sig Dispense Refill   aspirin 81 MG tablet Take 81 mg by mouth daily.     Cholecalciferol (VITAMIN D-3) 1000 units CAPS Take 2,000 tablets by mouth daily.     lansoprazole (PREVACID) 30 MG capsule Take 30 mg by mouth daily.     levothyroxine (SYNTHROID, LEVOTHROID) 112 MCG tablet Take 112 mcg by mouth daily.     potassium chloride SA (K-DUR,KLOR-CON) 20 MEQ tablet Take 20 mEq by mouth 2 (two) times daily.     pravastatin (PRAVACHOL) 20 MG tablet Take 20 mg by mouth daily.      Probiotic Product (PROBIOTIC PO) Take 1 capsule by mouth daily.     Teriflunomide (AUBAGIO) 14 MG TABS Take 14 mg by mouth daily. 90 tablet 3   vitamin E 400 UNIT capsule Take 400 Units by mouth daily.     diazepam (VALIUM) 5 MG tablet One or two po daily 180 tablet 1   FLUoxetine (PROZAC) 20 MG capsule Take 1 capsule (20 mg total) by mouth daily. 90 capsule 4   methylPREDNISolone (MEDROL DOSEPAK) 4 MG TBPK tablet Take 6 tablets on day 1 and decrease by 1 tablet each day until finished 21 tablet 0   NYSTATIN powder Apply 1 application topically 2 (two) times daily. 15 g 1   triamterene-hydrochlorothiazide (DYAZIDE) 37.5-25 MG capsule Take 1 each (1 capsule total) by mouth daily. 90 capsule 3   zolpidem  (AMBIEN CR) 12.5 MG CR tablet Take 1 tablet (12.5 mg total) by mouth at bedtime as needed for sleep. 20 tablet 0   zolpidem (AMBIEN CR) 12.5 MG CR tablet Take 1 tablet (12.5 mg total) by mouth at bedtime as needed for sleep. 90 tablet 1   No facility-administered medications prior to visit.     PAST MEDICAL HISTORY: Past Medical History:  Diagnosis Date   Anxiety    Celiac disease    Cervical dysplasia    Claustrophobia    Complication of anesthesia    slow to awaken, doesn't want Propofol- when she had it before, had to be reminded to breath.   Depression    Endometriosis    Family history of adverse reaction to anesthesia    mother difficulty intubation   GERD (gastroesophageal reflux disease)    Hearing loss    High cholesterol    Hypothyroidism    Multiple sclerosis (H[facility])    PONV (postoperative nausea and vomiting)    Vision abnormalities      PAST SURGICAL HISTORY: Past Surgical History:  Procedure Laterality Date   25 GAUGE PARS PLANA VITRECTOMY WITH 20 GAUGE MVR PORT FOR MACULAR HOLE Right 05/06/2018   25 GAUGE PARS PLANA VITRECTOMY WITH 20 GAUGE MVR  PORT FOR MACULAR HOLE Right 05/06/2018   Procedure: 75 GAUGE PARS PLANA VITRECTOMY WITH 20 GAUGE MVR PORT FOR MACULAR HOLE;  Surgeon: Hayden Pedro, MD;  Location: [facility];  Service: Ophthalmology;  Laterality: Right;   ABDOMINAL HYSTERECTOMY  1999   TAH,BSO menorrhgia,,endometriosis   CHOLECYSTECTOMY     COLONOSCOPY     COLPOSCOPY     FINGER SURGERY Right    5 finger    GAS/FLUID EXCHANGE Right 05/06/2018   Procedure: GAS/FLUID EXCHANGE C3F8;  Surgeon: Hayden Pedro, MD;  Location: [place] [facility];  Service: Ophthalmology;  Laterality: Right;   GYNECOLOGIC CRYOSURGERY  1987   LAPAROSCOPY     MEMBRANE PEEL Right 05/06/2018   Procedure: MEMBRANE PEEL;  Surgeon: Hayden Pedro, MD;  Location: [facility] [place];  Service: Ophthalmology;  Laterality: Right;   OOPHORECTOMY     PHOTOCOAGULATION WITH LASER Right 05/06/2018   Procedure:  PHOTOCOAGULATION WITH LASER;  Surgeon: Hayden Pedro, MD;  Location: [facility];  Service: Ophthalmology;  Laterality: Right;   ROTATOR CUFF REPAIR Right    VITRECTOMY       FAMILY HISTORY: Family History  Problem Relation Age of Onset   Breast cancer Mother        age 64's   Cancer Mother        UTERINE   Colon cancer Mother    Diabetes Father    Hypertension Father    Heart disease Father    Cancer Maternal Grandfather        Stomach cancer   Heart disease Paternal Grandfather      SOCIAL HISTORY: Social History   Socioeconomic History   Marital status: Married    Spouse name: Not on file   Number of children: Not on file   Years of education: Not on file   Highest education level: Not on file  Occupational History   Not on file  Tobacco Use   Smoking status: Former    Years: 15.00    Types: Cigarettes   Smokeless tobacco: Never   Tobacco comments:    05/05/18- quit at least 15 years ago  42 Use   Vaping Use: Never used  Substance and Sexual Activity   Alcohol use: No   Drug use: No   Sexual activity: Not Currently    Birth control/protection: Surgical  Other Topics Concern   Not on file  Social History Narrative   Not on file   Social Determinants of Health   Financial Resource Strain: Not on file  Food Insecurity: Not on file  Transportation Needs: Not on file  Physical Activity: Not on file  Stress: Not on file  Social Connections: Not on file  Intimate Partner Violence: Not on file     PHYSICAL EXAM  Vitals:   09/06/21 1239  BP: 122/82  Pulse: 76  SpO2: 97%  Weight: 75.5 kg  Height: 5' 3"  (1.6 m)   Body mass index is 29.49 kg/m.  Generalized: Well developed, in no acute distress  Cardiology: normal rate and rhythm, no murmur auscultated  Respiratory: clear to auscultation bilaterally    Neurological examination  Mentation: Alert oriented to time, place, history taking. Follows all commands speech and language fluent Cranial  nerve II-XII: Pupils were equal round reactive to light. Extraocular movements were full, visual field were full on confrontational test. Facial sensation and strength were normal. Head turning and shoulder shrug  were normal and symmetric. Motor: The motor testing reveals 5 over 5 strength of bilateral  upper and right lower extremities. 4 over 5 strength on the left lower extremity. Good symmetric motor tone is noted throughout.  Sensory: Sensory testing is intact to soft touch on all 4 extremities. No evidence of extinction is noted.  Coordination: Cerebellar testing reveals good finger-nose-finger and heel-to-shin bilaterally.  Gait and station: Gait is mildly wide, stable with no assistive device. Patient refuses tandem stating she can not do it.  Reflexes: Deep tendon reflexes are symmetric and normal bilaterally.    DIAGNOSTIC DATA (LABS, IMAGING, TESTING) - I reviewed patient records, labs, notes, testing and imaging myself where available.  Lab Results  Component Value Date   WBC 3.8 08/31/2020   HGB 14.5 08/31/2020   HCT 44.1 08/31/2020   MCV 86 08/31/2020   PLT 257 08/31/2020      Component Value Date/Time   NA 135 08/24/2019 0950   K 3.9 08/24/2019 0950   CL 94 (L) 08/24/2019 0950   CO2 26 08/24/2019 0950   GLUCOSE 94 08/24/2019 0950   GLUCOSE 94 05/06/2018 0945   BUN 11 08/24/2019 0950   CREATININE 0.83 08/24/2019 0950   CALCIUM 9.9 08/24/2019 0950   PROT 6.8 08/31/2020 1340   ALBUMIN 4.6 08/31/2020 1340   AST 19 08/31/2020 1340   ALT 21 08/31/2020 1340   ALKPHOS 70 08/31/2020 1340   BILITOT 0.4 08/31/2020 1340   GFRNONAA 72 08/24/2019 0950   GFRAA 83 08/24/2019 0950   No results found for: CHOL, HDL, LDLCALC, LDLDIRECT, TRIG, CHOLHDL No results found for: HGBA1C No results found for: VITAMINB12 Lab Results  Component Value Date   TSH 0.042 (L) 06/21/2014    No flowsheet data found.   No flowsheet data found.   ASSESSMENT AND PLAN  71 y.o. year old  female  has a past medical history of Anxiety, Celiac disease, Cervical dysplasia, Claustrophobia, Complication of anesthesia, Depression, Endometriosis, Family history of adverse reaction to anesthesia, GERD (gastroesophageal reflux disease), Hearing loss, High cholesterol, Hypothyroidism, Multiple sclerosis ([place]), PONV (postoperative nausea and vomiting), and Vision abnormalities. here with    Multiple sclerosis ([place]) - Plan: Hepatic Function Panel, CBC with Differential/Platelets  Vitamin D deficiency - Plan: Vitamin D, 25-hydroxy   Patient is stable on current MS regimen, we will continue Aubagio 14 mg by mouth daily. Continue Triamterene-hydrochlorothiazide (DYAZIDE) 37.5-25 MG capsule for the Mnire's. Continue Ambien XR 12.5 for insomnia and Vitamin D-3 1000, 2 capsules by mouth. PDMP checked and appropriate. We will collect Vitamin D, CBC, and liver function today.  Encouraged patient to continue using the CPAP daily and for >4 hours. Continue with your weight loss and consider repeating a sleep study.  Follow up with Dr. Felecia Shelling in 6 months or sooner if needed.   Lovena Le NP-S assisted with note.   Orders Placed This Encounter  Procedures   Hepatic Function Panel   CBC with Differential/Platelets   Vitamin D, 25-hydroxy     Meds ordered this encounter  Medications   diazepam (VALIUM) 5 MG tablet    Sig: One or two po daily    Dispense:  90 tablet    Refill:  0    Order Specific Question:   Supervising Provider    Answer:   Melvenia Beam [[id number]]   FLUoxetine (PROZAC) 20 MG capsule    Sig: Take 1 capsule (20 mg total) by mouth daily.    Dispense:  90 capsule    Refill:  4    Order Specific Question:   Supervising Provider  Answer:   Melvenia Beam [[id number]]   triamterene-hydrochlorothiazide (DYAZIDE) 37.5-25 MG capsule    Sig: Take 1 each (1 capsule total) by mouth daily.    Dispense:  90 capsule    Refill:  3    Order Specific Question:   Supervising Provider     Answer:   Melvenia Beam [[id number]]   zolpidem (AMBIEN CR) 12.5 MG CR tablet    Sig: Take 1 tablet (12.5 mg total) by mouth at bedtime as needed for sleep.    Dispense:  90 tablet    Refill:  1    Fax: [phone]    Order Specific Question:   Supervising Provider    Answer:   Melvenia Beam [[id number]]      Debbora Presto, MSN, FNP-C 09/06/2021, 1:43 PM  [facility] [address], [place], [place] [address] [phone]

## 2021-09-06 ENCOUNTER — Encounter: Payer: Self-pay | Admitting: Family Medicine

## 2021-09-06 ENCOUNTER — Ambulatory Visit (INDEPENDENT_AMBULATORY_CARE_PROVIDER_SITE_OTHER): Payer: Medicare Other | Admitting: Family Medicine

## 2021-09-06 VITALS — BP 122/82 | HR 76 | Ht 63.0 in | Wt 166.5 lb

## 2021-09-06 DIAGNOSIS — G35 Multiple sclerosis: Secondary | ICD-10-CM

## 2021-09-06 DIAGNOSIS — E559 Vitamin D deficiency, unspecified: Secondary | ICD-10-CM

## 2021-09-06 MED ORDER — FLUOXETINE HCL 20 MG PO CAPS: 20.0000 mg | ORAL_CAPSULE | Freq: Every day | ORAL | 4 refills | Status: DC

## 2021-09-06 MED ORDER — DIAZEPAM 5 MG PO TABS: ORAL_TABLET | ORAL | 0 refills | Status: DC

## 2021-09-06 MED ORDER — TRIAMTERENE-HCTZ 37.5-25 MG PO CAPS: 1.0000 | ORAL_CAPSULE | Freq: Every day | ORAL | 3 refills | Status: DC

## 2021-09-06 MED ORDER — ZOLPIDEM TARTRATE ER 12.5 MG PO TBCR: 12.5000 mg | EXTENDED_RELEASE_TABLET | Freq: Every evening | ORAL | 1 refills | Status: DC | PRN

## 2021-09-07 ENCOUNTER — Telehealth: Payer: Self-pay | Admitting: Neurology

## 2021-09-07 LAB — VITAMIN D 25 HYDROXY (VIT D DEFICIENCY, FRACTURES): Vit D, 25-Hydroxy: 84.6 ng/mL (ref 30.0–100.0)

## 2021-09-07 LAB — HEPATIC FUNCTION PANEL
ALT: 19 IU/L (ref 0–32)
AST: 16 IU/L (ref 0–40)
Albumin: 4.6 g/dL (ref 3.7–4.7)
Alkaline Phosphatase: 86 IU/L (ref 44–121)
Bilirubin Total: 0.4 mg/dL (ref 0.0–1.2)
Bilirubin, Direct: 0.16 mg/dL (ref 0.00–0.40)
Total Protein: 6.8 g/dL (ref 6.0–8.5)

## 2021-09-07 LAB — CBC WITH DIFFERENTIAL/PLATELET
Basophils Absolute: 0 10*3/uL (ref 0.0–0.2)
Basos: 1 %
EOS (ABSOLUTE): 0.1 10*3/uL (ref 0.0–0.4)
Eos: 2 %
Hematocrit: 43.3 % (ref 34.0–46.6)
Hemoglobin: 14.3 g/dL (ref 11.1–15.9)
Immature Grans (Abs): 0 10*3/uL (ref 0.0–0.1)
Immature Granulocytes: 0 %
Lymphocytes Absolute: 1.2 10*3/uL (ref 0.7–3.1)
Lymphs: 24 %
MCH: 28.1 pg (ref 26.6–33.0)
MCHC: 33 g/dL (ref 31.5–35.7)
MCV: 85 fL (ref 79–97)
Monocytes Absolute: 0.5 10*3/uL (ref 0.1–0.9)
Monocytes: 10 %
Neutrophils Absolute: 3.2 10*3/uL (ref 1.4–7.0)
Neutrophils: 63 %
Platelets: 259 10*3/uL (ref 150–450)
RBC: 5.09 x10E6/uL (ref 3.77–5.28)
RDW: 12.7 % (ref 11.7–15.4)
WBC: 5 10*3/uL (ref 3.4–10.8)

## 2021-09-07 NOTE — Telephone Encounter (Signed)
Called the pt and reviewed the normal labs with her. Pt verbalized understanding. Pt had no questions at this time but was encouraged to call back if questions arise.

## 2021-09-07 NOTE — Telephone Encounter (Signed)
-----   Message from Debbora Presto, NP sent at 09/07/2021  7:15 AM EST ----- Labs are normal!

## 2021-09-12 ENCOUNTER — Inpatient Hospital Stay: Admission: RE | Admit: 2021-09-12 | Payer: Medicare Other | Source: Ambulatory Visit

## 2021-12-29 ENCOUNTER — Other Ambulatory Visit: Payer: Self-pay

## 2021-12-29 ENCOUNTER — Emergency Department (HOSPITAL_BASED_OUTPATIENT_CLINIC_OR_DEPARTMENT_OTHER): Payer: Medicare Other | Admitting: Radiology

## 2021-12-29 ENCOUNTER — Encounter (HOSPITAL_BASED_OUTPATIENT_CLINIC_OR_DEPARTMENT_OTHER): Payer: Self-pay | Admitting: *Deleted

## 2021-12-29 ENCOUNTER — Emergency Department (HOSPITAL_BASED_OUTPATIENT_CLINIC_OR_DEPARTMENT_OTHER)
Admission: EM | Admit: 2021-12-29 | Discharge: 2021-12-29 | Disposition: A | Discharge status: Home / Self Care | Payer: Medicare Other | Attending: Emergency Medicine | Admitting: Emergency Medicine

## 2021-12-29 DIAGNOSIS — E876 Hypokalemia: Secondary | ICD-10-CM | POA: Diagnosis not present

## 2021-12-29 DIAGNOSIS — M25551 Pain in right hip: Secondary | ICD-10-CM | POA: Diagnosis not present

## 2021-12-29 DIAGNOSIS — Z7982 Long term (current) use of aspirin: Secondary | ICD-10-CM | POA: Diagnosis not present

## 2021-12-29 DIAGNOSIS — Z79899 Other long term (current) drug therapy: Secondary | ICD-10-CM | POA: Diagnosis not present

## 2021-12-29 DIAGNOSIS — R1031 Right lower quadrant pain: Secondary | ICD-10-CM | POA: Diagnosis not present

## 2021-12-29 LAB — URINALYSIS, ROUTINE W REFLEX MICROSCOPIC
Bilirubin Urine: NEGATIVE
Glucose, UA: NEGATIVE mg/dL
Hgb urine dipstick: NEGATIVE
Ketones, ur: NEGATIVE mg/dL
Nitrite: NEGATIVE
Protein, ur: NEGATIVE mg/dL
Specific Gravity, Urine: 1.011 (ref 1.005–1.030)
pH: 5.5 (ref 5.0–8.0)

## 2021-12-29 LAB — BASIC METABOLIC PANEL
Anion gap: 12 (ref 5–15)
BUN: 13 mg/dL (ref 8–23)
CO2: 30 mmol/L (ref 22–32)
Calcium: 9.7 mg/dL (ref 8.9–10.3)
Chloride: 93 mmol/L — ABNORMAL LOW (ref 98–111)
Creatinine, Ser: 0.67 mg/dL (ref 0.44–1.00)
GFR, Estimated: >60 mL/min (ref >60)
Glucose, Bld: 81 mg/dL (ref 70–99)
Potassium: 3 mmol/L — ABNORMAL LOW (ref 3.5–5.1)
Sodium: 135 mmol/L (ref 135–145)

## 2021-12-29 MED ORDER — HYDROCODONE-ACETAMINOPHEN 5-325 MG PO TABS
1.0000 | ORAL_TABLET | Freq: Once | ORAL | Status: AC
  Administered 2021-12-29: 1 via ORAL
  Filled 2021-12-29: qty 1

## 2021-12-29 MED ORDER — KETOROLAC TROMETHAMINE 15 MG/ML IJ SOLN
15.0000 mg | Freq: Once | INTRAMUSCULAR | Status: AC
  Administered 2021-12-29: 15 mg via INTRAMUSCULAR
  Filled 2021-12-29: qty 1

## 2021-12-29 NOTE — Discharge Instructions (Signed)
Take an extra potassium pill for the next 5 days. ?

## 2021-12-29 NOTE — ED Triage Notes (Signed)
Pt is here with right hip pain that has been increasing since Tuesday. Not associated with any trauma.  Pt tells me that Dr.  Clemencia Course said "get to the hospital asap".  Pt said that 2 physicians evaluated her at [facility] and she has a note with her that states  ?"Hydrocodone 5-376m 1-2 tqb up to evere 6 hours as needed for hip pain  [facility] ? ?

## 2021-12-29 NOTE — ED Provider Notes (Signed)
?[facility] ?Provider Note ? ? ?CSN: [id number] ?Arrival date & time: 12/29/21  1434 ? ?  ? ?History ? ?Chief Complaint  ?Patient presents with  ? Hip Pain  ? ? ?Leslie Ryan is a 71 y.o. female. ? ? ?Hip Pain ?Pertinent negatives include no abdominal pain and no shortness of breath. Patient with right hip/groin pain.  Has had for the last few days.  No injury but states she was sitting in a beachchair in the sand this weekend.  States no injury but was difficult to get out of.  Went to see PCP today for worsening pain.  Sent in.  Presented with a note saying that she was prescribed hydrocodone as an outpatient for this.  No x-ray done there.  No weakness.  Pain is somewhat crampy.  No dysuria.  No chest pain or trouble breathing. ? ?  ? ?Home Medications ?Prior to Admission medications   ?Medication Sig Start Date End Date Taking? Authorizing Provider  ?aspirin 81 MG tablet Take 81 mg by mouth daily.    [provider]  ?Cholecalciferol (VITAMIN D-3) 1000 units CAPS Take 2,000 tablets by mouth daily.    [provider]  ?diazepam (VALIUM) 5 MG tablet One or two po daily 09/06/21   Lomax, Amy, NP  ?FLUoxetine (PROZAC) 20 MG capsule Take 1 capsule (20 mg total) by mouth daily. 09/06/21   Lomax, Amy, NP  ?lansoprazole (PREVACID) 30 MG capsule Take 30 mg by mouth daily.    [provider]  ?levothyroxine (SYNTHROID, LEVOTHROID) 112 MCG tablet Take 112 mcg by mouth daily.    [provider]  ?potassium chloride SA (K-DUR,KLOR-CON) 20 MEQ tablet Take 20 mEq by mouth 2 (two) times daily.    [provider]  ?pravastatin (PRAVACHOL) 20 MG tablet Take 20 mg by mouth daily.     [provider]  ?Probiotic Product (PROBIOTIC PO) Take 1 capsule by mouth daily.    [provider]  ?Teriflunomide (AUBAGIO) 14 MG TABS Take 14 mg by mouth daily. 08/31/20   Sater, Nanine Means, MD  ?triamterene-hydrochlorothiazide (DYAZIDE) 37.5-25 MG  capsule Take 1 each (1 capsule total) by mouth daily. 09/06/21   Lomax, Amy, NP  ?vitamin E 400 UNIT capsule Take 400 Units by mouth daily.    [provider]  ?zolpidem (AMBIEN CR) 12.5 MG CR tablet Take 1 tablet (12.5 mg total) by mouth at bedtime as needed for sleep. 09/06/21   Debbora Presto, NP  ?   ? ?Allergies    ?Erythromycin, Flexeril [cyclobenzaprine], Propofol, and Morphine and related   ? ?Review of Systems   ?Review of Systems  ?Constitutional:  Negative for fever.  ?Respiratory:  Negative for shortness of breath.   ?Gastrointestinal:  Negative for abdominal pain.  ?Musculoskeletal:  Negative for back pain.  ?     Right hip/groin pain.  ?Neurological:  Negative for weakness.  ? ?Physical Exam ?Updated Vital Signs ?BP (!) 184/91 (BP Location: Right Arm)   Pulse 88   Temp 98.5 ?F (36.9 ?C) (Oral)   Resp 20   Wt 72.6 kg   LMP 08/20/1997   SpO2 99%   BMI 28.34 kg/m?  ?Physical Exam ?Vitals and nursing note reviewed.  ?Cardiovascular:  ?   Rate and Rhythm: Regular rhythm.  ?Abdominal:  ?   Palpations: There is no mass.  ?   Tenderness: There is no abdominal tenderness.  ?Musculoskeletal:     ?   General: Tenderness present.  ?  Cervical back: Neck supple.  ?   Comments: Some pain with internal rotation of the right hip.  Otherwise good range of motion.  Able to move externally.  No tenderness with axial loading.  No mass in the groin.  No hernia palpated.  Sensation intact distally.  ?Skin: ?   Capillary Refill: Capillary refill takes less than 2 seconds.  ?Neurological:  ?   Mental Status: She is alert and oriented to person, place, and time.  ? ? ?ED Results / Procedures / Treatments   ?Labs ?(all labs ordered are listed, but only abnormal results are displayed) ?Labs Reviewed  ?BASIC METABOLIC PANEL - Abnormal; Notable for the following components:  ?    Result Value  ? Potassium 3.0 (*)   ? Chloride 93 (*)   ? All other components within normal limits  ?URINALYSIS, ROUTINE W REFLEX MICROSCOPIC  - Abnormal; Notable for the following components:  ? Leukocytes,Ua MODERATE (*)   ? All other components within normal limits  ? ? ?EKG ?None ? ?Radiology ?DG Hip Unilat  With Pelvis 2-3 Views Right ? ?Result Date: 12/29/2021 ?CLINICAL DATA:  Acute right hip pain without known injury. EXAM: DG HIP (WITH OR WITHOUT PELVIS) 2-3V RIGHT COMPARISON:  None Available. FINDINGS: There is no evidence of hip fracture or dislocation. No joint space narrowing is noted. Mild osteophyte formation is seen involving the right hip. IMPRESSION: Mild degenerative joint disease of right hip. No acute abnormality seen. Electronically Signed   By: Marijo Conception M.D.   On: 12/29/2021 16:00   ? ?Procedures ?Procedures  ? ? ?Medications Ordered in ED ?Medications  ?ketorolac (TORADOL) 15 MG/ML injection 15 mg (15 mg Intramuscular Given 12/29/21 1919)  ?HYDROcodone-acetaminophen (NORCO/VICODIN) 5-325 MG per tablet 1 tablet (1 tablet Oral Given 12/29/21 1917)  ? ? ?ED Course/ Medical Decision Making/ A&P ?  ?                        ?Medical Decision Making ?Amount and/or Complexity of Data Reviewed ?Labs: ordered. ?Radiology: ordered. ? ?Risk ?Prescription drug management. ? ? ?Patient presents with pain in the right hip/groin.  Differential diagnosis includes trauma injury infection musculoskeletal pain. ?Has a mild hypokalemia.  History of same.  Is on supplementation.  X-ray done and reassuring.  Has some degenerative changes.  Pain medially on the hip.  Doubt occult fracture.  Doubt malignancy.  Urine showed some white cells but no bacteria or blood.  Doubt stone.  Given Toradol and feels somewhat better.  Also given hydrocodone.  Appears stable for discharge.  Has seen Dr. Latanya Maudlin for the past we will follow-up. ? ? ? ? ? ? ? ?Final Clinical Impression(s) / ED Diagnoses ?Final diagnoses:  ?Hypokalemia  ?Pain of right hip  ? ? ?Rx / DC Orders ?ED Discharge Orders   ? ? None  ? ?  ? ? ?  ?Davonna Belling, MD ?12/29/21 2030 ? ?

## 2022-01-01 DIAGNOSIS — M25551 Pain in right hip: Secondary | ICD-10-CM | POA: Diagnosis not present

## 2022-01-01 DIAGNOSIS — M47816 Spondylosis without myelopathy or radiculopathy, lumbar region: Secondary | ICD-10-CM | POA: Diagnosis not present

## 2022-01-04 ENCOUNTER — Other Ambulatory Visit: Payer: Self-pay | Admitting: Orthopaedic Surgery

## 2022-01-04 DIAGNOSIS — M25551 Pain in right hip: Secondary | ICD-10-CM

## 2022-01-05 ENCOUNTER — Emergency Department (HOSPITAL_COMMUNITY): Payer: Medicare Other

## 2022-01-05 ENCOUNTER — Emergency Department (HOSPITAL_COMMUNITY)
Admission: EM | Admit: 2022-01-05 | Discharge: 2022-01-06 | Disposition: A | Discharge status: Home / Self Care | Payer: Medicare Other | Attending: Emergency Medicine | Admitting: Emergency Medicine

## 2022-01-05 ENCOUNTER — Other Ambulatory Visit: Payer: Self-pay

## 2022-01-05 DIAGNOSIS — Z79899 Other long term (current) drug therapy: Secondary | ICD-10-CM | POA: Diagnosis not present

## 2022-01-05 DIAGNOSIS — M533 Sacrococcygeal disorders, not elsewhere classified: Secondary | ICD-10-CM | POA: Diagnosis not present

## 2022-01-05 DIAGNOSIS — Z7982 Long term (current) use of aspirin: Secondary | ICD-10-CM | POA: Insufficient documentation

## 2022-01-05 DIAGNOSIS — R1031 Right lower quadrant pain: Secondary | ICD-10-CM | POA: Diagnosis not present

## 2022-01-05 DIAGNOSIS — E039 Hypothyroidism, unspecified: Secondary | ICD-10-CM | POA: Insufficient documentation

## 2022-01-05 DIAGNOSIS — S79911A Unspecified injury of right hip, initial encounter: Secondary | ICD-10-CM | POA: Diagnosis not present

## 2022-01-05 DIAGNOSIS — M1611 Unilateral primary osteoarthritis, right hip: Secondary | ICD-10-CM | POA: Diagnosis not present

## 2022-01-05 DIAGNOSIS — M25551 Pain in right hip: Secondary | ICD-10-CM | POA: Insufficient documentation

## 2022-01-05 DIAGNOSIS — M25451 Effusion, right hip: Secondary | ICD-10-CM | POA: Diagnosis not present

## 2022-01-05 DIAGNOSIS — R109 Unspecified abdominal pain: Secondary | ICD-10-CM | POA: Diagnosis not present

## 2022-01-05 LAB — BASIC METABOLIC PANEL
Anion gap: 10 (ref 5–15)
BUN: 29 mg/dL — ABNORMAL HIGH (ref 8–23)
CO2: 24 mmol/L (ref 22–32)
Calcium: 8.8 mg/dL — ABNORMAL LOW (ref 8.9–10.3)
Chloride: 96 mmol/L — ABNORMAL LOW (ref 98–111)
Creatinine, Ser: 0.65 mg/dL (ref 0.44–1.00)
GFR, Estimated: >60 mL/min (ref >60)
Glucose, Bld: 110 mg/dL — ABNORMAL HIGH (ref 70–99)
Potassium: 3.8 mmol/L (ref 3.5–5.1)
Sodium: 130 mmol/L — ABNORMAL LOW (ref 135–145)

## 2022-01-05 LAB — CBC WITH DIFFERENTIAL/PLATELET
Abs Immature Granulocytes: 0.06 10*3/uL (ref 0.00–0.07)
Basophils Absolute: 0 10*3/uL (ref 0.0–0.1)
Basophils Relative: 0 %
Eosinophils Absolute: 0 10*3/uL (ref 0.0–0.5)
Eosinophils Relative: 0 %
HCT: 43.1 % (ref 36.0–46.0)
Hemoglobin: 14.3 g/dL (ref 12.0–15.0)
Immature Granulocytes: 1 %
Lymphocytes Relative: 17 %
Lymphs Abs: 1.2 10*3/uL (ref 0.7–4.0)
MCH: 28.7 pg (ref 26.0–34.0)
MCHC: 33.2 g/dL (ref 30.0–36.0)
MCV: 86.5 fL (ref 80.0–100.0)
Monocytes Absolute: 0.7 10*3/uL (ref 0.1–1.0)
Monocytes Relative: 10 %
Neutro Abs: 5 10*3/uL (ref 1.7–7.7)
Neutrophils Relative %: 72 %
Platelets: 311 10*3/uL (ref 150–400)
RBC: 4.98 MIL/uL (ref 3.87–5.11)
RDW: 13.1 % (ref 11.5–15.5)
WBC: 7 10*3/uL (ref 4.0–10.5)
nRBC: 0 % (ref 0.0–0.2)

## 2022-01-05 MED ORDER — HYDROCODONE-ACETAMINOPHEN 5-325 MG PO TABS
2.0000 | ORAL_TABLET | Freq: Once | ORAL | Status: AC
  Administered 2022-01-05: 2 via ORAL
  Filled 2022-01-05: qty 2

## 2022-01-05 MED ORDER — HYDROMORPHONE HCL 1 MG/ML IJ SOLN
0.5000 mg | Freq: Once | INTRAMUSCULAR | Status: AC
  Administered 2022-01-06: 0.5 mg via INTRAVENOUS
  Filled 2022-01-05: qty 1

## 2022-01-05 MED ORDER — IOHEXOL 300 MG/ML  SOLN
100.0000 mL | Freq: Once | INTRAMUSCULAR | Status: AC | PRN
  Administered 2022-01-05: 100 mL via INTRAVENOUS

## 2022-01-05 MED ORDER — FENTANYL CITRATE PF 50 MCG/ML IJ SOSY
50.0000 ug | PREFILLED_SYRINGE | Freq: Once | INTRAMUSCULAR | Status: AC
  Administered 2022-01-05: 50 ug via INTRAVENOUS
  Filled 2022-01-05: qty 1

## 2022-01-05 MED ORDER — ONDANSETRON 4 MG PO TBDP
4.0000 mg | ORAL_TABLET | Freq: Once | ORAL | Status: AC
  Administered 2022-01-05: 4 mg via ORAL
  Filled 2022-01-05: qty 1

## 2022-01-05 MED ORDER — HYDROMORPHONE HCL 1 MG/ML IJ SOLN
0.5000 mg | Freq: Once | INTRAMUSCULAR | Status: AC
  Administered 2022-01-05: 0.5 mg via INTRAVENOUS
  Filled 2022-01-05: qty 1

## 2022-01-05 MED ORDER — LORAZEPAM 1 MG PO TABS: 1.0000 mg | ORAL_TABLET | Freq: Once | ORAL | Status: DC

## 2022-01-05 MED ORDER — LORAZEPAM 2 MG/ML IJ SOLN
0.5000 mg | Freq: Once | INTRAMUSCULAR | Status: AC | PRN
  Administered 2022-01-06: 0.5 mg via INTRAVENOUS
  Filled 2022-01-05: qty 1

## 2022-01-05 MED ORDER — HYDROMORPHONE HCL 1 MG/ML IJ SOLN
0.5000 mg | INTRAMUSCULAR | Status: DC | PRN
  Administered 2022-01-06: 0.5 mg via INTRAVENOUS
  Filled 2022-01-05: qty 1

## 2022-01-05 NOTE — ED Provider Triage Note (Signed)
Emergency Medicine Provider Triage Evaluation Note  Leslie Ryan , Ryan 71 y.o. female  was evaluated in triage.  Pt complains of right hip pain radiating into her groin. Has associated nausea. Taking hydrocodone for her symptoms. No recent fall or injury. Has an MRI ordered by her orthopedist, Dr. Rhona Raider. Denies numbness, tingling, weakness, gait problem.   Review of Systems  Positive: As per HPI above Negative:   Physical Exam  BP (!) 151/84 (BP Location: Right Arm)   Pulse 93   Temp (!) 97.5 F (36.4 C) (Oral)   Resp 16   Ht 5' 3"  (1.6 m)   Wt 69.9 kg   LMP 08/20/1997   SpO2 99%   BMI 27.28 kg/m  Gen:   Awake, no distress   Resp:  Normal effort  MSK:   Moves extremities without difficulty  Other:  Strength and sensation intact to BLE. Pulses intact. Able to stand and ambulate without assistance. Ambulates with antalgic gait.  Medical Decision Making  Medically screening exam initiated at 1:12 PM.  Appropriate orders placed.  Leslie Ryan was informed that the remainder of the evaluation will be completed by another provider, this initial triage assessment does not replace that evaluation, and the importance of remaining in the ED until their evaluation is complete.  Work-up initiated.    Leslie Mashaw A, PA-C 01/05/22 1344

## 2022-01-05 NOTE — ED Triage Notes (Signed)
Pt. Stated, Im schedule for a MRI in [place] and have been approved but I cant wait til then.

## 2022-01-05 NOTE — ED Provider Notes (Signed)
[facility] DEPARTMENT Provider Note   CSN: [id number] Arrival date & time: 01/05/22  1222     History  Chief Complaint  Patient presents with   Hip Pain   Groin Pain    Leslie Ryan is a 71 y.o. female with a history of Ryan, hypothyroidism, depression, vitamin D deficiency presenting to the ED with right hip/groin pain. Pt reports awakening about 10 days ago with pain in her right groin and right hip which is gradually worsened since that time.  She was evaluated in the ED on 5/12 and had an x-ray of the right hip which was negative for acute abnormality and did show some degenerative changes.  She has been taking hydrocodone every 6 hours with some improvement in the pain, but states that this wears off after about 4 hours and the pain is severe.  She currently rates the pain at a 9/10.  She states that the pain occurs both at rest and with ambulation.  Pain radiates into the right buttock and down towards the right knee.  She denies any fevers.   Hip Pain Pertinent negatives include no abdominal pain and no shortness of breath.  Groin Pain Pertinent negatives include no abdominal pain and no shortness of breath.      Home Medications Prior to Admission medications   Medication Sig Start Date End Date Taking? Authorizing Provider  aspirin 81 MG tablet Take 81 mg by mouth daily.   Yes [provider]  Cholecalciferol (VITAMIN D-3) 1000 units CAPS Take 2,000 tablets by mouth daily.   Yes [provider]  diazepam (VALIUM) 5 MG tablet One or two po daily Patient taking differently: Take 5 mg by mouth daily. 09/06/21  Yes Lomax, Amy, NP  FLUoxetine (PROZAC) 20 MG capsule Take 1 capsule (20 mg total) by mouth daily. 09/06/21  Yes Lomax, Amy, NP  HYDROcodone-acetaminophen (NORCO/VICODIN) 5-325 MG tablet Take 2 tablets by mouth every 6 (six) hours as needed for moderate pain. 12/29/21  Yes [provider]  lansoprazole  (PREVACID) 30 MG capsule Take 30 mg by mouth daily.   Yes [provider]  levothyroxine (SYNTHROID, LEVOTHROID) 112 MCG tablet Take 112 mcg by mouth daily.   Yes [provider]  potassium chloride SA (K-DUR,KLOR-CON) 20 MEQ tablet Take 20 mEq by mouth 2 (two) times daily.   Yes [provider]  pravastatin (PRAVACHOL) 20 MG tablet Take 20 mg by mouth daily.    Yes [provider]  predniSONE (DELTASONE) 20 MG tablet Take 20 mg by mouth daily with breakfast. 12/30/21  Yes [provider]  Probiotic Product (PROBIOTIC PO) Take 1 capsule by mouth daily.   Yes [provider]  Teriflunomide (AUBAGIO) 14 MG TABS Take 14 mg by mouth daily. 08/31/20  Yes Sater, Nanine Means, MD  triamterene-hydrochlorothiazide (DYAZIDE) 37.5-25 MG capsule Take 1 each (1 capsule total) by mouth daily. 09/06/21  Yes Lomax, Amy, NP  vitamin E 400 UNIT capsule Take 400 Units by mouth daily.   Yes [provider]  zolpidem (AMBIEN CR) 12.5 MG CR tablet Take 1 tablet (12.5 mg total) by mouth at bedtime as needed for sleep. Patient taking differently: Take 12.5 mg by mouth at bedtime. 09/06/21  Yes Lomax, Amy, NP      Allergies    Erythromycin, Flexeril [cyclobenzaprine], Propofol, and Morphine and related    Review of Systems   Review of Systems  Constitutional:  Negative for fever.  Respiratory:  Negative for shortness of breath.   Gastrointestinal:  Negative for abdominal pain, nausea and vomiting.  Genitourinary:  Negative for dysuria.  Musculoskeletal:  Negative for back pain and joint swelling.       Positive for right hip/groin pain.  Skin:  Negative for rash.  Neurological:  Negative for syncope, weakness and numbness.   Physical Exam Updated Vital Signs BP 127/70   Pulse (!) 59   Temp 97.7 F (36.5 C) (Oral)   Resp 18   Ht 5' 3"  (1.6 m)   Wt 69.9 kg   LMP 08/20/1997   SpO2 94%   BMI 27.28 kg/m  Physical Exam Vitals and nursing note  reviewed.  Constitutional:      Appearance: She is not toxic-appearing or diaphoretic.  HENT:     Head: Normocephalic and atraumatic.     Right Ear: External ear normal.     Left Ear: External ear normal.     Nose: Nose normal.  Eyes:     General: No scleral icterus. Cardiovascular:     Rate and Rhythm: Normal rate and regular rhythm.     Heart sounds: Normal heart sounds. No murmur heard.   No friction rub. No gallop.     Comments: 2+ DP pulses bilaterally. Pulmonary:     Effort: No respiratory distress.     Breath sounds: Normal breath sounds. No stridor. No wheezing, rhonchi or rales.  Abdominal:     General: There is no distension.     Palpations: Abdomen is soft.     Tenderness: There is no abdominal tenderness. There is no guarding or rebound.     Hernia: No hernia is present.  Musculoskeletal:        General: No deformity.     Cervical back: Neck supple.     Right lower leg: No edema.     Left lower leg: No edema.     Comments: No tenderness, step-offs, or deformities over the midline thoracic or lumbar spine. She does have tenderness over the lateral right hip as well as over the right groin. Pain greatly increases with internal rotation of the right hip.  Skin:    General: Skin is warm and dry.     Comments: No overlying erythema or warmth over the right hip.  Neurological:     Mental Status: She is alert and oriented to person, place, and time.     Comments: Sensation is intact.  Strength in the right lower extremity is only slightly decreased due to pain but otherwise intact.    ED Results / Procedures / Treatments   Labs (all labs ordered are listed, but only abnormal results are displayed) Labs Reviewed  BASIC METABOLIC PANEL - Abnormal; Notable for the following components:      Result Value   Sodium 130 (*)    Chloride 96 (*)    Glucose, Bld 110 (*)    BUN 29 (*)    Calcium 8.8 (*)    All other components within normal limits  CBC WITH  DIFFERENTIAL/PLATELET    EKG None  Radiology CT ABDOMEN PELVIS W CONTRAST  Result Date: 01/05/2022 CLINICAL DATA:  Right lower quadrant abdominal pain. EXAM: CT ABDOMEN AND PELVIS WITH CONTRAST TECHNIQUE: Multidetector CT imaging of the abdomen and pelvis was performed using the standard protocol following bolus administration of intravenous contrast. RADIATION DOSE REDUCTION: This exam was performed according to the departmental dose-optimization program which includes automated exposure control, adjustment of the mA and/or kV according  to patient size and/or use of iterative reconstruction technique. CONTRAST:  153m OMNIPAQUE IOHEXOL 300 MG/ML  SOLN COMPARISON:  CT abdomen pelvis dated 06/21/2013. FINDINGS: Lower chest: The visualized lung bases are clear. No intra-abdominal free air or free fluid. Hepatobiliary: The liver is unremarkable. There is mild biliary ductal dilatation, post cholecystectomy. Pancreas: Unremarkable. No pancreatic ductal dilatation or surrounding inflammatory changes. Spleen: Normal in size without focal abnormality. Adrenals/Urinary Tract: The adrenal glands unremarkable. The kidneys, visualized ureters, and urinary bladder appear unremarkable. Stomach/Bowel: There is sigmoid diverticulosis without active inflammatory changes. There is moderate stool throughout the colon. There is no bowel obstruction or active inflammation. The appendix is not visualized with certainty. No inflammatory changes identified in the right lower quadrant. Vascular/Lymphatic: Mild aortoiliac atherosclerotic disease. The IVC is unremarkable. No portal venous gas. There is no adenopathy. Reproductive: Hysterectomy.  No adnexal masses. Other: None Musculoskeletal: Degenerative changes of the spine. No acute osseous pathology. Bilateral L5 pars defects with grade 1 L5-S1 anterolisthesis. IMPRESSION: 1. No acute intra-abdominal or pelvic pathology. 2. Sigmoid diverticulosis. 3. Aortic Atherosclerosis  (ICD10-I70.0). Electronically Signed   By: AAnner CreteM.D.   On: 01/05/2022 19:49    Procedures Procedures    Medications Ordered in ED Medications  LORazepam (ATIVAN) injection 0.5 mg (has no administration in time range)  HYDROmorphone (DILAUDID) injection 0.5 mg (has no administration in time range)  HYDROmorphone (DILAUDID) injection 0.5 mg (has no administration in time range)  ondansetron (ZOFRAN-ODT) disintegrating tablet 4 mg (4 mg Oral Given 01/05/22 1334)  HYDROcodone-acetaminophen (NORCO/VICODIN) 5-325 MG per tablet 2 tablet (2 tablets Oral Given 01/05/22 1600)  fentaNYL (SUBLIMAZE) injection 50 mcg (50 mcg Intravenous Given 01/05/22 1747)  iohexol (OMNIPAQUE) 300 MG/ML solution 100 mL (100 mLs Intravenous Contrast Given 01/05/22 1930)  HYDROmorphone (DILAUDID) injection 0.5 mg (0.5 mg Intravenous Given 01/05/22 2103)    ED Course/ Medical Decision Making/ A&P                           Medical Decision Making Amount and/or Complexity of Data Reviewed Labs: ordered. Radiology: ordered.  Risk Prescription drug management.   71year old female with a history of Ryan, hypothyroidism, depression, vitamin D deficiency presenting to the ED with right hip/groin pain.  On exam, the patient is afebrile and hemodynamically stable.  She does have some tenderness over the lateral portion of the right hip as well as over the right groin without a palpable inguinal hernia or lymphadenopathy.  Her pain greatly increases with internal rotation of the right hip.  Neurologically she is intact with only slightly decreased strength in the right hip secondary to pain.  Differential diagnosis includes occult fracture, ligamentous injury, avascular necrosis.  I have low suspicion for a septic joint given patient has not had any associated fevers.  We will obtain baseline labs including CBC and BMP.  MRI of the right hip was also ordered (this was originally scheduled for her as an outpatient,  but given that she is having worsening pain, will obtain the imaging today).  Patient given a dose of her home Norco for pain control.  Patient continues to endorse severe pain so she was given IV fentanyl.  She was also given 0.5 mg of IV Ativan for the MRI, but unfortunately was unable to tolerate the MRI due to pain.  CT abdomen/pelvis with contrast was obtained which did not show any acute intra-abdominal or pelvic pathology.  No obvious cause of her pain  within the right hip based on the CT.  Patient was then given IV Dilaudid for further pain control in order to trial the MRI again. Pt did have significant improvement in pain with the Dilaudid.  At the time of sign out, MRI is pending. I spoke with patient regarding the plan to discharge home after MRI and to continue her home pain medications and f/u with Dr. Rhona Raider unless significant findings on MRI. Pt verbalizes understanding.  Pt's care was signed out to Dr. Randal Buba @2330 .  Final Clinical Impression(s) / ED Diagnoses Final diagnoses:  Right hip pain  Right inguinal pain    Rx / DC Orders ED Discharge Orders     None         Sondra Come, MD 01/05/22 6433    Gareth Morgan, MD 01/07/22 1233

## 2022-01-05 NOTE — ED Triage Notes (Signed)
Pt. Stated, ive had rt. Groin/hip pain for over a week. No injury

## 2022-01-05 NOTE — Progress Notes (Signed)
Patient came for MRI, was given meds before she came down.  Patient couldn't hold still and stated she was in too much pain and wanted more medst or she wouldn't be able to complete exam.  Sent patient back to floor./tmh

## 2022-01-06 ENCOUNTER — Encounter (HOSPITAL_COMMUNITY): Payer: Self-pay | Admitting: Emergency Medicine

## 2022-01-06 ENCOUNTER — Emergency Department (HOSPITAL_COMMUNITY): Payer: Medicare Other

## 2022-01-06 DIAGNOSIS — M25451 Effusion, right hip: Secondary | ICD-10-CM | POA: Diagnosis not present

## 2022-01-06 DIAGNOSIS — S79911A Unspecified injury of right hip, initial encounter: Secondary | ICD-10-CM | POA: Diagnosis not present

## 2022-01-06 DIAGNOSIS — M25551 Pain in right hip: Secondary | ICD-10-CM | POA: Diagnosis not present

## 2022-01-06 DIAGNOSIS — R531 Weakness: Secondary | ICD-10-CM | POA: Diagnosis not present

## 2022-01-06 DIAGNOSIS — M533 Sacrococcygeal disorders, not elsewhere classified: Secondary | ICD-10-CM | POA: Diagnosis not present

## 2022-01-06 DIAGNOSIS — M1611 Unilateral primary osteoarthritis, right hip: Secondary | ICD-10-CM | POA: Diagnosis not present

## 2022-01-06 MED ORDER — HYDROMORPHONE HCL 1 MG/ML IJ SOLN
0.5000 mg | Freq: Once | INTRAMUSCULAR | Status: AC
  Administered 2022-01-06: 0.5 mg via INTRAVENOUS
  Filled 2022-01-06: qty 1

## 2022-01-06 MED ORDER — OXYCODONE-ACETAMINOPHEN 5-325 MG PO TABS: 1.0000 | ORAL_TABLET | ORAL | 0 refills | Status: DC | PRN

## 2022-01-06 MED ORDER — LORAZEPAM 2 MG/ML IJ SOLN
INTRAMUSCULAR | Status: AC
  Filled 2022-01-06: qty 1

## 2022-01-06 MED ORDER — KETOROLAC TROMETHAMINE 30 MG/ML IJ SOLN
30.0000 mg | Freq: Once | INTRAMUSCULAR | Status: AC
  Administered 2022-01-06: 30 mg via INTRAVENOUS
  Filled 2022-01-06: qty 1

## 2022-01-06 NOTE — ED Notes (Signed)
Pt transported to MRI 

## 2022-01-06 NOTE — ED Provider Notes (Signed)
7:09 AM Care assumed from Dr. Randal Buba.  At time of transfer of care, patient is awaiting for results of MRI of the hip.  Plan of care is to reassess patient after results return to determine disposition.  If concerning findings are discovered, will likely speak with orthopedics however if it is reassuring, plan of care be to discharge home for outpatient follow-up.  12:03 PM MRI returned without any evidence of fracture.  There was evidence of labrum injury and some synovitis.  Had a shared decision-making conversation with patient and given her lack of fevers or any abnormalities on blood work we have low suspicion for infection at this time and she agrees.  Patient will call her orthopedist for outpatient follow-up and we will give her crutches.  Patient reported concern crutches given her history of MS so we will have social work try to get her a walker.  Anticipate discharge with pain medicine after p.o. challenge.  Patient passed a p.o. challenge and was able to get a walker.  She was able to ambulate with it.  Prescription for increased pain medicine provided and patient is to follow-up with orthopedics team.  She no other questions or concerns and was discharged in good condition.  Clinical Impression: 1. Right hip pain   2. Right inguinal pain     Disposition: Discharge  Condition: Good  I have discussed the results, Dx and Tx plan with the pt(& family if present). He/she/they expressed understanding and agree(s) with the plan. Discharge instructions discussed at great length. Strict return precautions discussed and pt &/or family have verbalized understanding of the instructions. No further questions at time of discharge.    Discharge Medication List as of 01/06/2022  2:47 PM     START taking these medications   Details  oxyCODONE-acetaminophen (PERCOCET/ROXICET) 5-325 MG tablet Take 1 tablet by mouth every 4 (four) hours as needed for severe pain., Starting Sat 01/06/2022, Normal         Follow Up: Melrose Nakayama, MD [place] [address] [phone]     Marisa Sprinkles [address] [place] [address] [place] [address] [id number] mc [place] [phone]       Yasuko Lapage, Gwenyth Allegra, MD 01/06/22 [phone]

## 2022-01-06 NOTE — ED Notes (Signed)
Explained delay of care to pt due to waiting on MRI to be read by specialist.

## 2022-01-06 NOTE — ED Notes (Deleted)
Social work with walker at bedside.

## 2022-01-06 NOTE — ED Notes (Signed)
Social worker at bedside with walker.

## 2022-01-06 NOTE — ED Notes (Addendum)
Social Work called for update related to walker. Voicemail left. This RN will continue to attempt to reach out.

## 2022-01-06 NOTE — Care Management (Signed)
Rolling walker with 5 inch wheels ordered for patient via [employer]. They have a rush on it and will bring it up as soon as possible.

## 2022-01-06 NOTE — ED Notes (Signed)
Changed patient's bed into clean linen. Call bell at reach. Patient is on cardiac monitor.

## 2022-01-06 NOTE — Discharge Instructions (Signed)
Your history, exam, work-up today did not show evidence of acute fracture and your story was less consistent with infection after our conversation.  You did have evidence of degenerative changes, labrum injuries, and irritation in the hip.  We together agreed to increase your pain regimen and use the walker and have you follow-up with orthopedics for further management.  If any symptoms were to change or worsen including fevers, chills, or worsened uncontrolled pain, please return to the nearest emergency department.  Please rest and stay hydrated and use the walker so as not to fall.

## 2022-01-06 NOTE — ED Notes (Signed)
Pt returned from MRI °

## 2022-01-06 NOTE — ED Notes (Signed)
Social work called for a walker. Pt states she will not be able to use crutches due to MS.

## 2022-01-06 NOTE — ED Notes (Signed)
Patient verbalizes understanding of discharge instructions. Opportunity for questioning and answers were provided. Armband removed by staff, pt discharged from ED via wheelchair to lobby to return home with family.

## 2022-01-06 NOTE — ED Notes (Signed)
Social work contacted about walker, social work to bring walker down to ED.

## 2022-01-10 DIAGNOSIS — M25551 Pain in right hip: Secondary | ICD-10-CM | POA: Diagnosis not present

## 2022-01-16 ENCOUNTER — Other Ambulatory Visit: Payer: Self-pay | Admitting: Orthopaedic Surgery

## 2022-01-24 DIAGNOSIS — R262 Difficulty in walking, not elsewhere classified: Secondary | ICD-10-CM | POA: Diagnosis not present

## 2022-01-24 DIAGNOSIS — K219 Gastro-esophageal reflux disease without esophagitis: Secondary | ICD-10-CM | POA: Diagnosis not present

## 2022-01-24 DIAGNOSIS — M1611 Unilateral primary osteoarthritis, right hip: Secondary | ICD-10-CM | POA: Diagnosis not present

## 2022-01-24 DIAGNOSIS — M25651 Stiffness of right hip, not elsewhere classified: Secondary | ICD-10-CM | POA: Diagnosis not present

## 2022-01-24 DIAGNOSIS — G35 Multiple sclerosis: Secondary | ICD-10-CM | POA: Diagnosis not present

## 2022-01-24 DIAGNOSIS — M25551 Pain in right hip: Secondary | ICD-10-CM | POA: Diagnosis not present

## 2022-01-24 DIAGNOSIS — E039 Hypothyroidism, unspecified: Secondary | ICD-10-CM | POA: Diagnosis not present

## 2022-01-29 NOTE — Patient Instructions (Signed)
DUE TO COVID-19 ONLY TWO VISITORS  (aged 72 and older)  ARE ALLOWED TO COME WITH YOU AND STAY IN THE WAITING ROOM ONLY DURING PRE OP AND PROCEDURE.   **NO VISITORS ARE ALLOWED IN THE SHORT STAY AREA OR RECOVERY ROOM!!**  IF YOU WILL BE ADMITTED INTO THE HOSPITAL YOU ARE ALLOWED ONLY FOUR SUPPORT PEOPLE DURING VISITATION HOURS ONLY (7 AM -8PM)   The support person(s) must pass our screening, gel in and out, and wear a mask at all times, including in the patient's room. Patients must also wear a mask when staff or their support person are in the room. Visitors GUEST BADGE MUST BE WORN VISIBLY  One adult visitor may remain with you overnight and MUST be in the room by 8 P.M.     Your procedure is scheduled on: 02/06/22   Report to [facility] Main Entrance    Report to admitting at   7:10 AM   Call this number if you have problems the morning of surgery [phone]   Do not eat food :After Midnight.   After Midnight you may have the following liquids until _6:30  AM/ DAY OF SURGERY  Water Black Coffee (sugar ok, NO MILK/CREAM OR CREAMERS)  Tea (sugar ok, NO MILK/CREAM OR CREAMERS) regular and decaf                             Plain Jell-O (NO RED)                                           Fruit ices (not with fruit pulp, NO RED)                                     Popsicles (NO RED)                                                                  Juice: apple, WHITE grape, WHITE cranberry Sports drinks like Gatorade (NO RED) Clear broth(vegetable,chicken,beef)                     The day of surgery:  Drink ONE (1) Pre-Surgery Clear Ensure at 6:15 AM the morning of surgery. Drink in one sitting. Do not sip.  This drink was given to you during your hospital  pre-op appointment visit. Nothing else to drink after completing the  Pre-Surgery Clear Ensure at 6:30 AM          If you have questions, please contact your surgeon's office.      Oral Hygiene is also important  to reduce your risk of infection.                                    Remember - BRUSH YOUR TEETH THE MORNING OF SURGERY WITH YOUR REGULAR TOOTHPASTE   Do NOT smoke after Midnight   Take these medicines the morning of surgery with A SIP OF WATER:  Fluoxitine, Levothyroxine, Lansoprazole, Teriflunomide, Diazepam if needed   Bring CPAP mask and tubing day of surgery.                              You may not have any metal on your body including hair pins, jewelry, and body piercing             Do not wear make-up, lotions, powders, perfumes/cologne, or deodorant  Do not wear nail polish including gel and S&S, artificial/acrylic nails, or any other type of covering on natural nails including finger and toenails. If you have artificial nails, gel coating, etc. that needs to be removed by a nail salon please have this removed prior to surgery or surgery may need to be canceled/ delayed if the surgeon/ anesthesia feels like they are unable to be safely monitored.   Do not shave  48 hours prior to surgery.     Do not bring valuables to the hospital. [place].   Contacts, dentures or bridgework may not be worn into surgery.   DO NOT [place]. PHARMACY WILL DISPENSE MEDICATIONS LISTED ON YOUR MEDICATION LIST TO YOU DURING YOUR ADMISSION [place]!    Patients discharged on the day of surgery will not be allowed to drive home.  Someone NEEDS to stay with you for the first 24 hours after anesthesia.   Special Instructions: Bring a copy of your healthcare power of attorney and living will documents the day of surgery if you haven't scanned them before.              Please read over the following fact sheets you were given: IF YOU HAVE QUESTIONS ABOUT YOUR PRE-OP INSTRUCTIONS PLEASE CALL [phone]     [facility] - Preparing for Surgery Before surgery, you can play an important role.  Because skin is not  sterile, your skin needs to be as free of germs as possible.  You can reduce the number of germs on your skin by washing with CHG (chlorahexidine gluconate) soap before surgery.  CHG is an antiseptic cleaner which kills germs and bonds with the skin to continue killing germs even after washing. Please DO NOT use if you have an allergy to CHG or antibacterial soaps.  If your skin becomes reddened/irritated stop using the CHG and inform your nurse when you arrive at Short Stay. Do not shave (including legs and underarms) for at least 48 hours prior to the first CHG shower.   Please follow these instructions carefully:  1.  Shower with CHG Soap the night before surgery and the  morning of Surgery.  2.  If you choose to wash your hair, wash your hair first as usual with your  normal  shampoo.  3.  After you shampoo, rinse your hair and body thoroughly to remove the  shampoo.                            4.  Use CHG as you would any other liquid soap.  You can apply chg directly  to the skin and wash                       Gently with a scrungie or clean washcloth.  5.  Apply the CHG Soap to your body ONLY FROM THE NECK DOWN.   Do not use on face/ open                           Wound or open sores. Avoid contact with eyes, ears mouth and genitals (private parts).                       Wash face,  Genitals (private parts) with your normal soap.             6.  Wash thoroughly, paying special attention to the area where your surgery  will be performed.  7.  Thoroughly rinse your body with warm water from the neck down.  8.  DO NOT shower/wash with your normal soap after using and rinsing off  the CHG Soap.                9.  Pat yourself dry with a clean towel.            10.  Wear clean pajamas.            11.  Place clean sheets on your bed the night of your first shower and do not  sleep with pets. Day of Surgery : Do not apply any lotions/deodorants the morning of surgery.  Please wear clean clothes to  the hospital/surgery center.  FAILURE TO FOLLOW THESE INSTRUCTIONS MAY RESULT IN THE CANCELLATION OF YOUR SURGERY   ________________________________________________________________________   Incentive Spirometer  An incentive spirometer is a tool that can help keep your lungs clear and active. This tool measures how well you are filling your lungs with each breath. Taking long deep breaths may help reverse or decrease the chance of developing breathing (pulmonary) problems (especially infection) following: A long period of time when you are unable to move or be active. BEFORE THE PROCEDURE  If the spirometer includes an indicator to show your best effort, your nurse or respiratory therapist will set it to a desired goal. If possible, sit up straight or lean slightly forward. Try not to slouch. Hold the incentive spirometer in an upright position. INSTRUCTIONS FOR USE  Sit on the edge of your bed if possible, or sit up as far as you can in bed or on a chair. Hold the incentive spirometer in an upright position. Breathe out normally. Place the mouthpiece in your mouth and seal your lips tightly around it. Breathe in slowly and as deeply as possible, raising the piston or the ball toward the top of the column. Hold your breath for 3-5 seconds or for as long as possible. Allow the piston or ball to fall to the bottom of the column. Remove the mouthpiece from your mouth and breathe out normally. Rest for a few seconds and repeat Steps 1 through 7 at least 10 times every 1-2 hours when you are awake. Take your time and take a few normal breaths between deep breaths. The spirometer may include an indicator to show your best effort. Use the indicator as a goal to work toward during each repetition. After each set of 10 deep breaths, practice coughing to be sure your lungs are clear. If you have an incision (the cut made at the time of surgery), support your incision when coughing by placing a pillow  or rolled up towels firmly against it. Once you are able to get out of bed,  walk around indoors and cough well. You may stop using the incentive spirometer when instructed by your caregiver.  RISKS AND COMPLICATIONS Take your time so you do not get dizzy or light-headed. If you are in pain, you may need to take or ask for pain medication before doing incentive spirometry. It is harder to take a deep breath if you are having pain. AFTER USE Rest and breathe slowly and easily. It can be helpful to keep track of a log of your progress. Your caregiver can provide you with a simple table to help with this. If you are using the spirometer at home, follow these instructions: [place] IF:  You are having difficultly using the spirometer. You have trouble using the spirometer as often as instructed. Your pain medication is not giving enough relief while using the spirometer. You develop fever of 100.5 F (38.1 C) or higher. SEEK IMMEDIATE MEDICAL CARE IF:  You cough up bloody sputum that had not been present before. You develop fever of 102 F (38.9 C) or greater. You develop worsening pain at or near the incision site. MAKE SURE YOU:  Understand these instructions. Will watch your condition. Will get help right away if you are not doing well or get worse. Document Released: 12/17/2006 Document Revised: 10/29/2011 Document Reviewed: 02/17/2007 [facility] Patient Information 2014 [place], [place].   ________________________________________________________________________

## 2022-01-31 ENCOUNTER — Encounter (HOSPITAL_COMMUNITY): Payer: Self-pay

## 2022-01-31 ENCOUNTER — Other Ambulatory Visit: Payer: Self-pay

## 2022-01-31 ENCOUNTER — Ambulatory Visit (HOSPITAL_COMMUNITY)
Admission: RE | Admit: 2022-01-31 | Discharge: 2022-01-31 | Disposition: A | Discharge status: Home / Self Care | Payer: Medicare Other | Source: Ambulatory Visit | Attending: Orthopaedic Surgery | Admitting: Orthopaedic Surgery

## 2022-01-31 ENCOUNTER — Encounter (HOSPITAL_COMMUNITY)
Admission: RE | Admit: 2022-01-31 | Discharge: 2022-01-31 | Disposition: A | Discharge status: Home / Self Care | Payer: Medicare Other | Source: Ambulatory Visit | Attending: Orthopaedic Surgery | Admitting: Orthopaedic Surgery

## 2022-01-31 DIAGNOSIS — Z01818 Encounter for other preprocedural examination: Secondary | ICD-10-CM | POA: Diagnosis not present

## 2022-01-31 HISTORY — DX: Sleep apnea, unspecified: G47.30

## 2022-01-31 LAB — CBC
HCT: 41.2 % (ref 36.0–46.0)
Hemoglobin: 13.6 g/dL (ref 12.0–15.0)
MCH: 28.5 pg (ref 26.0–34.0)
MCHC: 33 g/dL (ref 30.0–36.0)
MCV: 86.2 fL (ref 80.0–100.0)
Platelets: 310 10*3/uL (ref 150–400)
RBC: 4.78 MIL/uL (ref 3.87–5.11)
RDW: 13.1 % (ref 11.5–15.5)
WBC: 4.6 10*3/uL (ref 4.0–10.5)
nRBC: 0 % (ref 0.0–0.2)

## 2022-01-31 LAB — BASIC METABOLIC PANEL
Anion gap: 7 (ref 5–15)
BUN: 14 mg/dL (ref 8–23)
CO2: 29 mmol/L (ref 22–32)
Calcium: 9.1 mg/dL (ref 8.9–10.3)
Chloride: 103 mmol/L (ref 98–111)
Creatinine, Ser: 0.68 mg/dL (ref 0.44–1.00)
GFR, Estimated: >60 mL/min (ref >60)
Glucose, Bld: 87 mg/dL (ref 70–99)
Potassium: 3.2 mmol/L — ABNORMAL LOW (ref 3.5–5.1)
Sodium: 139 mmol/L (ref 135–145)

## 2022-01-31 LAB — SURGICAL PCR SCREEN
MRSA, PCR: NEGATIVE
Staphylococcus aureus: NEGATIVE

## 2022-01-31 NOTE — Progress Notes (Signed)
Anesthesia note:  Bowel prep reminder:na  PCP - Dr. Deirdre Pippins Cardiologist -no Other- Neurology-Dr. Park Liter  Chest x-ray - no EKG - 01/22/22-epic Stress Test - no ECHO - no Cardiac Cath - NA  Pacemaker/ICD device last checked:  Sleep Study - yes CPAP - yes  Pt is pre diabetic-NA Fasting Blood Sugar -  Checks Blood Sugar _____  Blood Thinner:ASA81 mg/ Dr. Reynaldo Minium Blood Thinner Instructions: Aspirin Instructions:none Last Dose:  Anesthesia review: yes  Patient denies shortness of breath, fever, cough and chest pain at PAT appointment Pt has MS so she does get fatigued but not SOB.  Patient verbalized understanding of instructions that were given to them at the PAT appointment. Patient was also instructed that they will need to review over the PAT instructions again at home before surgery. yes

## 2022-01-31 NOTE — H&P (Signed)
TOTAL HIP ADMISSION H&P  Patient is admitted for right total hip arthroplasty.  Subjective:  Chief Complaint: right hip pain  HPI: Leslie Ryan, 71 y.o. female, has a history of pain and functional disability in the right hip(s) due to arthritis and patient has failed non-surgical conservative treatments for greater than 12 weeks to include NSAID's and/or analgesics, flexibility and strengthening excercises, supervised PT with diminished ADL's post treatment, use of assistive devices, and weight reduction as appropriate.  Onset of symptoms was gradual starting 5 years ago with gradually worsening course since that time.The patient noted no past surgery on the right hip(s).  Patient currently rates pain in the right hip at 10 out of 10 with activity. Patient has night pain, worsening of pain with activity and weight bearing, trendelenberg gait, pain that interfers with activities of daily living, and crepitus. Patient has evidence of subchondral cysts, subchondral sclerosis, periarticular osteophytes, and joint space narrowing by imaging studies. This condition presents safety issues increasing the risk of falls. There is no current active infection.  Patient Active Problem List   Diagnosis Date Noted   High risk medication use 08/31/2020   Vitamin D deficiency 08/24/2019   OSA (obstructive sleep apnea) 08/15/2018   Macular hole, right eye 05/06/2018   Macular hole, right 04/07/2018   Depression 11/15/2017   Stress at home 09/19/2016   Meniere's disease 09/15/2015   Insomnia 09/15/2015   Chronic fatigue 09/15/2014   Ataxic gait 09/15/2014   Vertigo 06/20/2014   H/O vitamin D deficiency 12/24/2012   Cervical dysplasia    Celiac disease    Multiple sclerosis ([place])    Hypothyroidism    Past Medical History:  Diagnosis Date   Anxiety    Celiac disease    Cervical dysplasia    Claustrophobia    Complication of anesthesia    slow to awaken, doesn't want Propofol- when she had  it before, had to be reminded to breath.   Depression    Family history of adverse reaction to anesthesia    mother difficulty intubation   GERD (gastroesophageal reflux disease)    Hearing loss    Hypothyroidism    Multiple sclerosis (HCC)    PONV (postoperative nausea and vomiting)    Sleep apnea    C-Pap   Vision abnormalities     Past Surgical History:  Procedure Laterality Date   25 GAUGE PARS PLANA VITRECTOMY WITH 20 GAUGE MVR PORT FOR MACULAR HOLE Right 05/06/2018   25 GAUGE PARS PLANA VITRECTOMY WITH 20 GAUGE MVR PORT FOR MACULAR HOLE Right 05/06/2018   Procedure: 25 GAUGE PARS PLANA VITRECTOMY WITH 20 GAUGE MVR PORT FOR MACULAR HOLE;  Surgeon: Hayden Pedro, MD;  Location: [place];  Service: Ophthalmology;  Laterality: Right;   ABDOMINAL HYSTERECTOMY  1999   TAH,BSO menorrhgia,,endometriosis   CHOLECYSTECTOMY  1983   COLONOSCOPY     COLPOSCOPY     FINGER SURGERY Right    5 finger    GAS/FLUID EXCHANGE Right 05/06/2018   Procedure: GAS/FLUID EXCHANGE C3F8;  Surgeon: Hayden Pedro, MD;  Location: [place];  Service: Ophthalmology;  Laterality: Right;   [place]   MEMBRANE PEEL Right 05/06/2018   Procedure: MEMBRANE PEEL;  Surgeon: Hayden Pedro, MD;  Location: [place];  Service: Ophthalmology;  Laterality: Right;   PHOTOCOAGULATION WITH LASER Right 05/06/2018   Procedure: PHOTOCOAGULATION WITH LASER;  Surgeon: Hayden Pedro, MD;  Location: [place];  Service:  Ophthalmology;  Laterality: Right;   ROTATOR CUFF REPAIR Right 2015   VITRECTOMY      No current facility-administered medications for this encounter.   Current Outpatient Medications  Medication Sig Dispense Refill Last Dose   aspirin 81 MG tablet Take 81 mg by mouth daily.      Cholecalciferol (VITAMIN D-3) 1000 units CAPS Take 2,000 tablets by mouth daily.      diazepam (VALIUM) 5 MG tablet One or two po daily (Patient taking differently: Take 5 mg by mouth daily.) 90  tablet 0    FLUoxetine (PROZAC) 20 MG capsule Take 1 capsule (20 mg total) by mouth daily. 90 capsule 4    lansoprazole (PREVACID) 30 MG capsule Take 30 mg by mouth daily.      levothyroxine (SYNTHROID, LEVOTHROID) 112 MCG tablet Take 112 mcg by mouth daily.      potassium chloride SA (K-DUR,KLOR-CON) 20 MEQ tablet Take 20 mEq by mouth 2 (two) times daily.      pravastatin (PRAVACHOL) 20 MG tablet Take 20 mg by mouth daily.      Teriflunomide (AUBAGIO) 14 MG TABS Take 14 mg by mouth daily. 90 tablet 3    triamterene-hydrochlorothiazide (DYAZIDE) 37.5-25 MG capsule Take 1 each (1 capsule total) by mouth daily. 90 capsule 3    vitamin E 400 UNIT capsule Take 400 Units by mouth daily.      zolpidem (AMBIEN CR) 12.5 MG CR tablet Take 1 tablet (12.5 mg total) by mouth at bedtime as needed for sleep. (Patient taking differently: Take 12.5 mg by mouth at bedtime.) 90 tablet 1    oxyCODONE-acetaminophen (PERCOCET/ROXICET) 5-325 MG tablet Take 1 tablet by mouth every 4 (four) hours as needed for severe pain. (Patient not taking: Reported on 01/23/2022) 15 tablet 0 Not Taking   Probiotic Product (PROBIOTIC PO) Take 1 capsule by mouth daily. (Patient not taking: Reported on 01/23/2022)   Not Taking   Allergies  Allergen Reactions   Erythromycin Other (See Comments)    Abdominal cramping   Flexeril [Cyclobenzaprine]     Severe constipation   Propofol Other (See Comments)    Has trouble waking up   Morphine And Related     Patient described heaviness and loss of feeling in legs coupled with a squeezing feeling in the epigastric region. Requested no further Morphine doses.       Social History   Tobacco Use   Smoking status: Former    Packs/day: 0.50    Years: 15.00    Total pack years: 7.50    Types: Cigarettes    Quit date: 2008    Years since quitting: 15.4   Smokeless tobacco: Never   Tobacco comments:    05/05/18- quit at least 15 years ago  Substance Use Topics   Alcohol use: No    Family  History  Problem Relation Age of Onset   Breast cancer Mother        age 53's   Cancer Mother        UTERINE   Colon cancer Mother    Diabetes Father    Hypertension Father    Heart disease Father    Cancer Maternal Grandfather        Stomach cancer   Heart disease Paternal Grandfather      Review of Systems  Objective:  Physical Exam  Vital signs in last 24 hours: Temp:  [98.1 F (36.7 C)] 98.1 F (36.7 C) (06/14 1304) Pulse Rate:  [75] 75 (06/14  1304) Resp:  [16] 16 (06/14 1304) BP: (157)/(80) 157/80 (06/14 1304) SpO2:  [100 %] 100 % (06/14 1304) Weight:  [72.1 kg] 72.1 kg (06/14 1304)  Labs:   Estimated body mass index is 28.17 kg/m as calculated from the following:   Height as of 01/31/22: 5' 3"  (1.6 m).   Weight as of 01/31/22: 72.1 kg.   Imaging Review Plain radiographs demonstrate severe degenerative joint disease of the right hip(s). The bone quality appears to be good for age and reported activity level.      Assessment/Plan:  End stage primary arthritis, right hip(s)  The patient history, physical examination, clinical judgement of the provider and imaging studies are consistent with end stage degenerative joint disease of the right hip(s) and total hip arthroplasty is deemed medically necessary. The treatment options including medical management, injection therapy, arthroscopy and arthroplasty were discussed at length. The risks and benefits of total hip arthroplasty were presented and reviewed. The risks due to aseptic loosening, infection, stiffness, dislocation/subluxation,  thromboembolic complications and other imponderables were discussed.  The patient acknowledged the explanation, agreed to proceed with the plan and consent was signed. Patient is being admitted for inpatient treatment for surgery, pain control, PT, OT, prophylactic antibiotics, VTE prophylaxis, progressive ambulation and ADL's and discharge planning.The patient is planning to be  discharged home with home health services

## 2022-02-05 MED ORDER — TRANEXAMIC ACID 1000 MG/10ML IV SOLN
2000.0000 mg | INTRAVENOUS | Status: DC
  Filled 2022-02-05: qty 20

## 2022-02-06 ENCOUNTER — Observation Stay (HOSPITAL_COMMUNITY)
Admission: RE | Admit: 2022-02-06 | Discharge: 2022-02-07 | Disposition: A | Discharge status: Home Health | Payer: Medicare Other | Source: Ambulatory Visit | Attending: Orthopaedic Surgery | Admitting: Orthopaedic Surgery

## 2022-02-06 ENCOUNTER — Ambulatory Visit (HOSPITAL_COMMUNITY): Payer: Medicare Other | Admitting: Certified Registered"

## 2022-02-06 ENCOUNTER — Ambulatory Visit (HOSPITAL_COMMUNITY): Payer: Medicare Other

## 2022-02-06 ENCOUNTER — Other Ambulatory Visit: Payer: Self-pay

## 2022-02-06 ENCOUNTER — Encounter (HOSPITAL_COMMUNITY): Payer: Self-pay | Admitting: Orthopaedic Surgery

## 2022-02-06 ENCOUNTER — Encounter (HOSPITAL_COMMUNITY)
Admission: RE | Disposition: A | Discharge status: Home Health | Payer: Self-pay | Source: Ambulatory Visit | Attending: Orthopaedic Surgery

## 2022-02-06 ENCOUNTER — Ambulatory Visit (HOSPITAL_BASED_OUTPATIENT_CLINIC_OR_DEPARTMENT_OTHER): Payer: Medicare Other | Admitting: Certified Registered"

## 2022-02-06 DIAGNOSIS — Z7982 Long term (current) use of aspirin: Secondary | ICD-10-CM | POA: Diagnosis not present

## 2022-02-06 DIAGNOSIS — Z79899 Other long term (current) drug therapy: Secondary | ICD-10-CM | POA: Diagnosis not present

## 2022-02-06 DIAGNOSIS — Z87891 Personal history of nicotine dependence: Secondary | ICD-10-CM | POA: Insufficient documentation

## 2022-02-06 DIAGNOSIS — Z96641 Presence of right artificial hip joint: Secondary | ICD-10-CM | POA: Diagnosis not present

## 2022-02-06 DIAGNOSIS — M1611 Unilateral primary osteoarthritis, right hip: Secondary | ICD-10-CM

## 2022-02-06 DIAGNOSIS — Z471 Aftercare following joint replacement surgery: Secondary | ICD-10-CM | POA: Diagnosis not present

## 2022-02-06 DIAGNOSIS — E039 Hypothyroidism, unspecified: Secondary | ICD-10-CM | POA: Diagnosis not present

## 2022-02-06 DIAGNOSIS — Z01818 Encounter for other preprocedural examination: Secondary | ICD-10-CM

## 2022-02-06 HISTORY — PX: TOTAL HIP ARTHROPLASTY: SHX124

## 2022-02-06 LAB — TYPE AND SCREEN
ABO/RH(D): O NEG
Antibody Screen: NEGATIVE

## 2022-02-06 LAB — ABO/RH: ABO/RH(D): O NEG

## 2022-02-06 SURGERY — ARTHROPLASTY, HIP, TOTAL, ANTERIOR APPROACH
Anesthesia: General | Site: Hip | Laterality: Right

## 2022-02-06 MED ORDER — BUPIVACAINE LIPOSOME 1.3 % IJ SUSP: 10.0000 mL | Freq: Once | INTRAMUSCULAR | Status: DC

## 2022-02-06 MED ORDER — ONDANSETRON HCL 4 MG PO TABS: 4.0000 mg | ORAL_TABLET | Freq: Four times a day (QID) | ORAL | Status: DC | PRN

## 2022-02-06 MED ORDER — ORAL CARE MOUTH RINSE
15.0000 mL | Freq: Once | OROMUCOSAL | Status: AC
Start: 2022-02-06 — End: 2022-02-06

## 2022-02-06 MED ORDER — ONDANSETRON HCL 4 MG/2ML IJ SOLN
4.0000 mg | Freq: Four times a day (QID) | INTRAMUSCULAR | Status: DC | PRN
  Administered 2022-02-06: 4 mg via INTRAVENOUS
  Filled 2022-02-06: qty 2

## 2022-02-06 MED ORDER — PROPOFOL 10 MG/ML IV BOLUS
INTRAVENOUS | Status: AC
  Filled 2022-02-06: qty 20

## 2022-02-06 MED ORDER — PHENOL 1.4 % MT LIQD: 1.0000 | OROMUCOSAL | Status: DC | PRN

## 2022-02-06 MED ORDER — KETOROLAC TROMETHAMINE 15 MG/ML IJ SOLN
7.5000 mg | Freq: Four times a day (QID) | INTRAMUSCULAR | Status: AC
  Administered 2022-02-06 – 2022-02-07 (×4): 7.5 mg via INTRAVENOUS
  Filled 2022-02-06 (×4): qty 1

## 2022-02-06 MED ORDER — CEFAZOLIN SODIUM-DEXTROSE 2-4 GM/100ML-% IV SOLN
2.0000 g | INTRAVENOUS | Status: AC
  Administered 2022-02-06: 2 g via INTRAVENOUS
  Filled 2022-02-06: qty 100

## 2022-02-06 MED ORDER — LEVOTHYROXINE SODIUM 112 MCG PO TABS
112.0000 ug | ORAL_TABLET | Freq: Every day | ORAL | Status: DC
  Administered 2022-02-07: 112 ug via ORAL
  Filled 2022-02-06: qty 1

## 2022-02-06 MED ORDER — METHOCARBAMOL 500 MG IVPB - SIMPLE MED
500.0000 mg | Freq: Four times a day (QID) | INTRAVENOUS | Status: DC | PRN
  Administered 2022-02-06: 500 mg via INTRAVENOUS

## 2022-02-06 MED ORDER — ROCURONIUM BROMIDE 100 MG/10ML IV SOLN
INTRAVENOUS | Status: DC | PRN
  Administered 2022-02-06: 50 mg via INTRAVENOUS

## 2022-02-06 MED ORDER — ALBUTEROL SULFATE HFA 108 (90 BASE) MCG/ACT IN AERS
INHALATION_SPRAY | RESPIRATORY_TRACT | Status: AC
  Filled 2022-02-06: qty 6.7

## 2022-02-06 MED ORDER — DIAZEPAM 5 MG PO TABS
5.0000 mg | ORAL_TABLET | Freq: Every day | ORAL | Status: DC | PRN
  Administered 2022-02-07: 5 mg via ORAL
  Filled 2022-02-06: qty 1

## 2022-02-06 MED ORDER — MENTHOL 3 MG MT LOZG: 1.0000 | LOZENGE | OROMUCOSAL | Status: DC | PRN

## 2022-02-06 MED ORDER — PRAVASTATIN SODIUM 20 MG PO TABS
20.0000 mg | ORAL_TABLET | Freq: Every day | ORAL | Status: DC
  Administered 2022-02-06 – 2022-02-07 (×2): 20 mg via ORAL
  Filled 2022-02-06 (×2): qty 1

## 2022-02-06 MED ORDER — CHLORHEXIDINE GLUCONATE 0.12 % MT SOLN
15.0000 mL | Freq: Once | OROMUCOSAL | Status: AC
  Administered 2022-02-06: 15 mL via OROMUCOSAL

## 2022-02-06 MED ORDER — BUPIVACAINE LIPOSOME 1.3 % IJ SUSP
INTRAMUSCULAR | Status: DC | PRN
  Administered 2022-02-06: 10 mL

## 2022-02-06 MED ORDER — ASPIRIN 81 MG PO CHEW
81.0000 mg | CHEWABLE_TABLET | Freq: Two times a day (BID) | ORAL | Status: DC
  Administered 2022-02-07: 81 mg via ORAL
  Filled 2022-02-06: qty 1

## 2022-02-06 MED ORDER — OXYCODONE HCL 5 MG PO TABS
10.0000 mg | ORAL_TABLET | ORAL | Status: DC | PRN
  Filled 2022-02-06 (×2): qty 2

## 2022-02-06 MED ORDER — TRIAMTERENE-HCTZ 37.5-25 MG PO CAPS: 1.0000 | ORAL_CAPSULE | Freq: Every day | ORAL | Status: DC

## 2022-02-06 MED ORDER — LACTATED RINGERS IV SOLN: INTRAVENOUS | Status: DC

## 2022-02-06 MED ORDER — BUPIVACAINE LIPOSOME 1.3 % IJ SUSP
INTRAMUSCULAR | Status: AC
  Filled 2022-02-06: qty 10

## 2022-02-06 MED ORDER — POTASSIUM CHLORIDE CRYS ER 20 MEQ PO TBCR
20.0000 meq | EXTENDED_RELEASE_TABLET | Freq: Two times a day (BID) | ORAL | Status: DC
  Administered 2022-02-06 – 2022-02-07 (×3): 20 meq via ORAL
  Filled 2022-02-06 (×3): qty 1

## 2022-02-06 MED ORDER — PROPOFOL 500 MG/50ML IV EMUL
INTRAVENOUS | Status: DC | PRN
  Administered 2022-02-06: 125 ug/kg/min via INTRAVENOUS

## 2022-02-06 MED ORDER — METOCLOPRAMIDE HCL 5 MG PO TABS
5.0000 mg | ORAL_TABLET | Freq: Three times a day (TID) | ORAL | Status: DC | PRN
  Administered 2022-02-07: 10 mg via ORAL
  Filled 2022-02-06: qty 2

## 2022-02-06 MED ORDER — LIDOCAINE 2% (20 MG/ML) 5 ML SYRINGE
INTRAMUSCULAR | Status: DC | PRN
  Administered 2022-02-06: 60 mg via INTRAVENOUS

## 2022-02-06 MED ORDER — ZOLPIDEM TARTRATE 5 MG PO TABS: 5.0000 mg | ORAL_TABLET | Freq: Every evening | ORAL | Status: DC | PRN

## 2022-02-06 MED ORDER — HYDROMORPHONE HCL 1 MG/ML IJ SOLN
INTRAMUSCULAR | Status: AC
  Filled 2022-02-06: qty 1

## 2022-02-06 MED ORDER — MIDAZOLAM HCL 5 MG/5ML IJ SOLN
INTRAMUSCULAR | Status: DC | PRN
  Administered 2022-02-06: 1 mg via INTRAVENOUS

## 2022-02-06 MED ORDER — HYDROMORPHONE HCL 1 MG/ML IJ SOLN
0.5000 mg | INTRAMUSCULAR | Status: DC | PRN
  Administered 2022-02-06 (×2): 1 mg via INTRAVENOUS
  Filled 2022-02-06 (×2): qty 1

## 2022-02-06 MED ORDER — ACETAMINOPHEN 500 MG PO TABS
1000.0000 mg | ORAL_TABLET | Freq: Once | ORAL | Status: AC
  Administered 2022-02-06: 1000 mg via ORAL
  Filled 2022-02-06: qty 2

## 2022-02-06 MED ORDER — TRANEXAMIC ACID-NACL 1000-0.7 MG/100ML-% IV SOLN
1000.0000 mg | Freq: Once | INTRAVENOUS | Status: AC
  Administered 2022-02-06: 1000 mg via INTRAVENOUS
  Filled 2022-02-06: qty 100

## 2022-02-06 MED ORDER — OXYCODONE HCL 5 MG PO TABS
5.0000 mg | ORAL_TABLET | ORAL | Status: DC | PRN
  Administered 2022-02-06 – 2022-02-07 (×3): 10 mg via ORAL
  Filled 2022-02-06: qty 2

## 2022-02-06 MED ORDER — ROCURONIUM BROMIDE 10 MG/ML (PF) SYRINGE
PREFILLED_SYRINGE | INTRAVENOUS | Status: AC
  Filled 2022-02-06: qty 10

## 2022-02-06 MED ORDER — TRANEXAMIC ACID-NACL 1000-0.7 MG/100ML-% IV SOLN
1000.0000 mg | INTRAVENOUS | Status: AC
  Administered 2022-02-06: 1000 mg via INTRAVENOUS
  Filled 2022-02-06: qty 100

## 2022-02-06 MED ORDER — MIDAZOLAM HCL 2 MG/2ML IJ SOLN
INTRAMUSCULAR | Status: AC
  Filled 2022-02-06: qty 2

## 2022-02-06 MED ORDER — PANTOPRAZOLE SODIUM 40 MG PO TBEC
40.0000 mg | DELAYED_RELEASE_TABLET | Freq: Every day | ORAL | Status: DC
  Administered 2022-02-06 – 2022-02-07 (×2): 40 mg via ORAL
  Filled 2022-02-06 (×2): qty 1

## 2022-02-06 MED ORDER — HYDROMORPHONE HCL 1 MG/ML IJ SOLN
0.2500 mg | INTRAMUSCULAR | Status: DC | PRN
  Administered 2022-02-06 (×4): 0.5 mg via INTRAVENOUS

## 2022-02-06 MED ORDER — DIPHENHYDRAMINE HCL 12.5 MG/5ML PO ELIX: 12.5000 mg | ORAL_SOLUTION | ORAL | Status: DC | PRN

## 2022-02-06 MED ORDER — FENTANYL CITRATE PF 50 MCG/ML IJ SOSY
25.0000 ug | PREFILLED_SYRINGE | INTRAMUSCULAR | Status: DC | PRN
  Administered 2022-02-06 (×2): 25 ug via INTRAVENOUS
  Administered 2022-02-06 (×2): 50 ug via INTRAVENOUS

## 2022-02-06 MED ORDER — SUGAMMADEX SODIUM 200 MG/2ML IV SOLN
INTRAVENOUS | Status: DC | PRN
  Administered 2022-02-06: 150 mg via INTRAVENOUS

## 2022-02-06 MED ORDER — 0.9 % SODIUM CHLORIDE (POUR BTL) OPTIME
TOPICAL | Status: DC | PRN
  Administered 2022-02-06: 1000 mL

## 2022-02-06 MED ORDER — ONDANSETRON HCL 4 MG/2ML IJ SOLN
INTRAMUSCULAR | Status: AC
  Filled 2022-02-06: qty 2

## 2022-02-06 MED ORDER — FENTANYL CITRATE (PF) 100 MCG/2ML IJ SOLN
INTRAMUSCULAR | Status: AC
  Filled 2022-02-06: qty 2

## 2022-02-06 MED ORDER — KETOROLAC TROMETHAMINE 30 MG/ML IJ SOLN
INTRAMUSCULAR | Status: AC
  Filled 2022-02-06: qty 1

## 2022-02-06 MED ORDER — CEFAZOLIN SODIUM-DEXTROSE 2-4 GM/100ML-% IV SOLN
2.0000 g | Freq: Four times a day (QID) | INTRAVENOUS | Status: AC
  Administered 2022-02-06 (×2): 2 g via INTRAVENOUS
  Filled 2022-02-06 (×2): qty 100

## 2022-02-06 MED ORDER — METOCLOPRAMIDE HCL 5 MG/ML IJ SOLN
5.0000 mg | Freq: Three times a day (TID) | INTRAMUSCULAR | Status: DC | PRN
  Administered 2022-02-06: 10 mg via INTRAVENOUS
  Filled 2022-02-06: qty 2

## 2022-02-06 MED ORDER — ALUM & MAG HYDROXIDE-SIMETH 200-200-20 MG/5ML PO SUSP: 30.0000 mL | ORAL | Status: DC | PRN

## 2022-02-06 MED ORDER — TERIFLUNOMIDE 14 MG PO TABS: 14.0000 mg | ORAL_TABLET | Freq: Every day | ORAL | Status: DC

## 2022-02-06 MED ORDER — POVIDONE-IODINE 10 % EX SWAB
2.0000 | Freq: Once | CUTANEOUS | Status: AC
  Administered 2022-02-06: 2 via TOPICAL

## 2022-02-06 MED ORDER — KETOROLAC TROMETHAMINE 30 MG/ML IJ SOLN
30.0000 mg | Freq: Once | INTRAMUSCULAR | Status: AC
  Administered 2022-02-06: 30 mg via INTRAVENOUS

## 2022-02-06 MED ORDER — METHOCARBAMOL 500 MG PO TABS
500.0000 mg | ORAL_TABLET | Freq: Four times a day (QID) | ORAL | Status: DC | PRN
  Administered 2022-02-07: 500 mg via ORAL
  Filled 2022-02-06 (×2): qty 1

## 2022-02-06 MED ORDER — BISACODYL 5 MG PO TBEC: 5.0000 mg | DELAYED_RELEASE_TABLET | Freq: Every day | ORAL | Status: DC | PRN

## 2022-02-06 MED ORDER — PROPOFOL 10 MG/ML IV BOLUS
INTRAVENOUS | Status: DC | PRN
  Administered 2022-02-06: 100 mg via INTRAVENOUS

## 2022-02-06 MED ORDER — FLUOXETINE HCL 20 MG PO CAPS
20.0000 mg | ORAL_CAPSULE | Freq: Every day | ORAL | Status: DC
  Administered 2022-02-07: 20 mg via ORAL
  Filled 2022-02-06: qty 1

## 2022-02-06 MED ORDER — BUPIVACAINE-EPINEPHRINE (PF) 0.25% -1:200000 IJ SOLN
INTRAMUSCULAR | Status: AC
  Filled 2022-02-06: qty 30

## 2022-02-06 MED ORDER — METHOCARBAMOL 500 MG IVPB - SIMPLE MED
INTRAVENOUS | Status: AC
  Filled 2022-02-06: qty 50

## 2022-02-06 MED ORDER — DEXAMETHASONE SODIUM PHOSPHATE 10 MG/ML IJ SOLN
INTRAMUSCULAR | Status: AC
  Filled 2022-02-06: qty 1

## 2022-02-06 MED ORDER — FENTANYL CITRATE PF 50 MCG/ML IJ SOSY
PREFILLED_SYRINGE | INTRAMUSCULAR | Status: AC
  Filled 2022-02-06: qty 2

## 2022-02-06 MED ORDER — FENTANYL CITRATE PF 50 MCG/ML IJ SOSY
PREFILLED_SYRINGE | INTRAMUSCULAR | Status: AC
  Filled 2022-02-06: qty 1

## 2022-02-06 MED ORDER — FENTANYL CITRATE (PF) 100 MCG/2ML IJ SOLN
INTRAMUSCULAR | Status: DC | PRN
  Administered 2022-02-06: 50 ug via INTRAVENOUS
  Administered 2022-02-06 (×2): 25 ug via INTRAVENOUS
  Administered 2022-02-06 (×2): 50 ug via INTRAVENOUS

## 2022-02-06 MED ORDER — BUPIVACAINE-EPINEPHRINE (PF) 0.25% -1:200000 IJ SOLN
INTRAMUSCULAR | Status: DC | PRN
  Administered 2022-02-06: 30 mL

## 2022-02-06 MED ORDER — LIDOCAINE HCL (PF) 2 % IJ SOLN
INTRAMUSCULAR | Status: AC
  Filled 2022-02-06: qty 5

## 2022-02-06 MED ORDER — ONDANSETRON HCL 4 MG/2ML IJ SOLN
INTRAMUSCULAR | Status: DC | PRN
  Administered 2022-02-06: 4 mg via INTRAVENOUS

## 2022-02-06 MED ORDER — DEXAMETHASONE SODIUM PHOSPHATE 10 MG/ML IJ SOLN
INTRAMUSCULAR | Status: DC | PRN
  Administered 2022-02-06: 8 mg via INTRAVENOUS

## 2022-02-06 MED ORDER — ACETAMINOPHEN 500 MG PO TABS
1000.0000 mg | ORAL_TABLET | Freq: Four times a day (QID) | ORAL | Status: AC
  Administered 2022-02-06 – 2022-02-07 (×4): 1000 mg via ORAL
  Filled 2022-02-06 (×4): qty 2

## 2022-02-06 MED ORDER — ACETAMINOPHEN 325 MG PO TABS: 325.0000 mg | ORAL_TABLET | Freq: Four times a day (QID) | ORAL | Status: DC | PRN

## 2022-02-06 MED ORDER — DOCUSATE SODIUM 100 MG PO CAPS
100.0000 mg | ORAL_CAPSULE | Freq: Two times a day (BID) | ORAL | Status: DC
  Administered 2022-02-06 – 2022-02-07 (×3): 100 mg via ORAL
  Filled 2022-02-06 (×3): qty 1

## 2022-02-06 MED ORDER — TRIAMTERENE-HCTZ 37.5-25 MG PO TABS
1.0000 | ORAL_TABLET | Freq: Every day | ORAL | Status: DC
  Administered 2022-02-06 – 2022-02-07 (×2): 1 via ORAL
  Filled 2022-02-06 (×2): qty 1

## 2022-02-06 MED ORDER — TRANEXAMIC ACID 1000 MG/10ML IV SOLN
INTRAVENOUS | Status: DC | PRN
  Administered 2022-02-06: 2000 mg via TOPICAL

## 2022-02-06 SURGICAL SUPPLY — 46 items
BAG COUNTER SPONGE SURGICOUNT (BAG) IMPLANT
BAG DECANTER FOR FLEXI CONT (MISCELLANEOUS) ×3 IMPLANT
BAG SPNG CNTER NS LX DISP (BAG)
BALL HIP CERAMIC (Hips) IMPLANT
BLADE SAW SGTL 18X1.27X75 (BLADE) ×3 IMPLANT
BOOTIES KNEE HIGH SLOAN (MISCELLANEOUS) ×3 IMPLANT
CELLS DAT CNTRL 66122 CELL SVR (MISCELLANEOUS) ×1 IMPLANT
COVER PERINEAL POST (MISCELLANEOUS) ×3 IMPLANT
COVER SURGICAL LIGHT HANDLE (MISCELLANEOUS) ×3 IMPLANT
DRAPE FOOT SWITCH (DRAPES) ×3 IMPLANT
DRAPE IMP U-DRAPE 54X76 (DRAPES) ×3 IMPLANT
DRAPE STERI IOBAN 125X83 (DRAPES) ×3 IMPLANT
DRAPE U-SHAPE 47X51 STRL (DRAPES) ×6 IMPLANT
DRSG AQUACEL AG ADV 3.5X 6 (GAUZE/BANDAGES/DRESSINGS) ×3 IMPLANT
DURAPREP 26ML APPLICATOR (WOUND CARE) ×3 IMPLANT
ELECT BLADE TIP CTD 4 INCH (ELECTRODE) ×3 IMPLANT
ELECT REM PT RETURN 15FT ADLT (MISCELLANEOUS) ×3 IMPLANT
ELIMINATOR HOLE APEX DEPUY (Hips) ×1 IMPLANT
GLOVE BIO SURGEON STRL SZ8 (GLOVE) ×6 IMPLANT
GLOVE BIOGEL PI IND STRL 8 (GLOVE) ×4 IMPLANT
GLOVE BIOGEL PI INDICATOR 8 (GLOVE) ×2
GOWN STRL REUS W/ TWL XL LVL3 (GOWN DISPOSABLE) ×4 IMPLANT
GOWN STRL REUS W/TWL XL LVL3 (GOWN DISPOSABLE) ×4
HIP BALL CERAMIC (Hips) ×2 IMPLANT
HOLDER FOLEY CATH W/STRAP (MISCELLANEOUS) ×3 IMPLANT
KIT TURNOVER KIT A (KITS) IMPLANT
LINER ACETABULAR 32X50 (Liner) ×1 IMPLANT
LINER PINN ACET GRIP 50X100 (Liner) ×1 IMPLANT
MANIFOLD NEPTUNE II (INSTRUMENTS) ×3 IMPLANT
NEEDLE HYPO 22GX1.5 SAFETY (NEEDLE) ×3 IMPLANT
NS IRRIG 1000ML POUR BTL (IV SOLUTION) ×3 IMPLANT
PACK ANTERIOR HIP CUSTOM (KITS) ×3 IMPLANT
PENCIL SMOKE EVACUATOR (MISCELLANEOUS) IMPLANT
PROTECTOR NERVE ULNAR (MISCELLANEOUS) ×3 IMPLANT
RETRACTOR WND ALEXIS 18 MED (MISCELLANEOUS) ×2 IMPLANT
RTRCTR WOUND ALEXIS 18CM MED (MISCELLANEOUS) ×2
SPIKE FLUID TRANSFER (MISCELLANEOUS) ×3 IMPLANT
STEM FEM ACTIS STD SZ4 (Stem) ×1 IMPLANT
SUT ETHIBOND NAB CT1 #1 30IN (SUTURE) ×6 IMPLANT
SUT VIC AB 1 CT1 36 (SUTURE) ×3 IMPLANT
SUT VIC AB 2-0 CT1 27 (SUTURE) ×2
SUT VIC AB 2-0 CT1 TAPERPNT 27 (SUTURE) ×2 IMPLANT
SUT VICRYL AB 3-0 FS1 BRD 27IN (SUTURE) ×3 IMPLANT
SUT VLOC 180 0 24IN GS25 (SUTURE) ×3 IMPLANT
SYR 50ML LL SCALE MARK (SYRINGE) ×3 IMPLANT
TRAY FOLEY MTR SLVR 16FR STAT (SET/KITS/TRAYS/PACK) ×3 IMPLANT

## 2022-02-06 NOTE — Anesthesia Postprocedure Evaluation (Signed)
Anesthesia Post Note  Patient: Aleksandra Raben Novello  Procedure(s) Performed: RIGHT TOTAL HIP ARTHROPLASTY ANTERIOR APPROACH (Right: Hip)     Patient location during evaluation: PACU Anesthesia Type: General Level of consciousness: awake and alert Pain management: pain level controlled Vital Signs Assessment: post-procedure vital signs reviewed and stable Respiratory status: spontaneous breathing, nonlabored ventilation and respiratory function stable Cardiovascular status: blood pressure returned to baseline and stable Postop Assessment: no apparent nausea or vomiting Anesthetic complications: no   No notable events documented.  Last Vitals:  Vitals:   02/06/22 1245 02/06/22 1327  BP: (!) 112/59 113/68  Pulse: 70 71  Resp: 11 16  Temp: 36.4 C 36.6 C  SpO2: 97% 96%    Last Pain:  Vitals:   02/06/22 1327  TempSrc: Oral  PainSc:                  Yvonne Petite,W. EDMOND

## 2022-02-06 NOTE — Anesthesia Procedure Notes (Signed)
Procedure Name: Intubation Date/Time: 02/06/2022 9:51 AM  Performed by: Gwyndolyn Saxon, CRNAPre-anesthesia Checklist: Patient identified, Emergency Drugs available, Suction available and Patient being monitored Patient Re-evaluated:Patient Re-evaluated prior to induction Oxygen Delivery Method: Circle system utilized Preoxygenation: Pre-oxygenation with 100% oxygen Induction Type: IV induction Ventilation: Mask ventilation without difficulty Laryngoscope Size: Miller and 2 Grade View: Grade I Tube type: Oral Tube size: 7.0 mm Number of attempts: 1 Airway Equipment and Method: Patient positioned with wedge pillow and Stylet Placement Confirmation: ETT inserted through vocal cords under direct vision, positive ETCO2 and breath sounds checked- equal and bilateral Secured at: 21 cm Tube secured with: Tape Dental Injury: Teeth and Oropharynx as per pre-operative assessment

## 2022-02-06 NOTE — Evaluation (Signed)
Physical Therapy Evaluation Patient Details Name: Leslie Ryan MRN: [id number] DOB: 1951-01-22 Today's Date: 02/06/2022  History of Present Illness  Pt is a 71yo female presenting s/pt R-THA, AA on 02/06/22. PMH: cervical dysplasia, anxiety & depression, GERD, hypothyroidism, multiple sclerosis, PONV, R-RCR 2015.   Clinical Impression  Leslie Ryan is a 71 y.o. female POD 0 s/p R-THA, AA. Patient reports modified independent using SPC for mobility at baseline. Patient is now limited by functional impairments (see PT problem list below) and requires min guard for bed mobility. Upon sitting EOB, pt became nauseated and unable to continue, RN provided anti-nausea medication, further mobility deferred.  Patient instructed in exercise to facilitate ROM and circulation to manage edema. Patient will benefit from continued skilled PT interventions to address impairments and progress towards PLOF. Pt does not have 24/7 assistance upon discharge, her daughter will be caring for her at night only as she works during the day and the pt expressed that she does not "want to bother her at night, she's busy and needs her rest." Acute PT will follow to progress mobility and HEP in preparation for safe discharge home.       Recommendations for follow up therapy are one component of a multi-disciplinary discharge planning process, led by the attending physician.  Recommendations may be updated based on patient status, additional functional criteria and insurance authorization.  Follow Up Recommendations Follow physician's recommendations for discharge plan and follow up therapies      Assistance Recommended at Discharge Intermittent Supervision/Assistance  Patient can return home with the following  A little help with walking and/or transfers;A little help with bathing/dressing/bathroom;Assistance with cooking/housework;Assist for transportation;Help with stairs or ramp for entrance    Equipment  Recommendations BSC/3in1  Recommendations for Other Services       Functional Status Assessment Patient has had a recent decline in their functional status and demonstrates the ability to make significant improvements in function in a reasonable and predictable amount of time.     Precautions / Restrictions Precautions Precautions: Fall Restrictions Weight Bearing Restrictions: No Other Position/Activity Restrictions: WBAT      Mobility  Bed Mobility Overal bed mobility: Needs Assistance Bed Mobility: Supine to Sit, Sit to Supine     Supine to sit: Min guard Sit to supine: Min guard   General bed mobility comments: Pt min guard for safety, no physical assist required, pt used LLE to bring RLE off/on bed. Upon sitting EOB, pt reporting nausea. Gave pt emesis bag and cool washcloth, no relief of symptoms, RN provided anti-nausea medication. Pt returned to supine and positioned for comfort, further mobility deferred.    Transfers                   General transfer comment: deferred    Ambulation/Gait               General Gait Details: deferred  Stairs            Wheelchair Mobility    Modified Rankin (Stroke Patients Only)       Balance Overall balance assessment: Mild deficits observed, not formally tested                                           Pertinent Vitals/Pain Pain Assessment Pain Assessment: 0-10 Pain Score: 3  Pain Location: R hip Pain Descriptors / Indicators: Operative  site guarding, Discomfort, Grimacing Pain Intervention(s): Limited activity within patient's tolerance, Monitored during session, Repositioned, Ice applied    Home Living Family/patient expects to be discharged to:: Private residence Living Arrangements: Children (two big dogs) Available Help at Discharge: Family;Available PRN/intermittently (daughter at night, daughter work) Type of Home: House Home Access: Level entry (3' step)        Home Layout: Able to live on main level with bedroom/bathroom Home Equipment: Conservation officer, nature (2 wheels);Cane - single point      Prior Function Prior Level of Function : Independent/Modified Independent             Mobility Comments: uses SPC "once in a while" ADLs Comments: ind     Hand Dominance        Extremity/Trunk Assessment   Upper Extremity Assessment Upper Extremity Assessment: Overall WFL for tasks assessed    Lower Extremity Assessment Lower Extremity Assessment: RLE deficits/detail;LLE deficits/detail RLE Deficits / Details: MMT ank DF/PF 5/5 RLE Sensation: WNL LLE Deficits / Details: MMT ank DF/PF 5/5 LLE Sensation: WNL    Cervical / Trunk Assessment Cervical / Trunk Assessment: Kyphotic  Communication   Communication: No difficulties  Cognition Arousal/Alertness: Awake/alert Behavior During Therapy: WFL for tasks assessed/performed Overall Cognitive Status: Within Functional Limits for tasks assessed                                          General Comments      Exercises Total Joint Exercises Ankle Circles/Pumps: AROM, 20 reps, Supine Other Exercises Other Exercises: incentive spirometry x3, VCs for slow and controlled   Assessment/Plan    PT Assessment Patient needs continued PT services  PT Problem List Decreased strength;Decreased range of motion;Decreased activity tolerance;Decreased balance;Decreased mobility;Decreased coordination;Pain       PT Treatment Interventions DME instruction;Gait training;Stair training;Functional mobility training;Therapeutic activities;Therapeutic exercise;Balance training;Neuromuscular re-education;Patient/family education    PT Goals (Current goals can be found in the Care Plan section)  Acute Rehab PT Goals Patient Stated Goal: To walk without pain PT Goal Formulation: With patient Time For Goal Achievement: 02/13/22 Potential to Achieve Goals: Good    Frequency 7X/week      Co-evaluation               AM-PAC PT "6 Clicks" Mobility  Outcome Measure Help needed turning from your back to your side while in a flat bed without using bedrails?: A Little Help needed moving from lying on your back to sitting on the side of a flat bed without using bedrails?: A Little Help needed moving to and from a bed to a chair (including a wheelchair)?: A Little Help needed standing up from a chair using your arms (e.g., wheelchair or bedside chair)?: A Little Help needed to walk in hospital room?: A Little Help needed climbing 3-5 steps with a railing? : A Lot 6 Click Score: 17    End of Session   Activity Tolerance: Treatment limited secondary to medical complications (Comment) (nausea) Patient left: in bed;with call bell/phone within reach;with bed alarm set;with nursing/sitter in room;with SCD's reapplied Nurse Communication: Mobility status PT Visit Diagnosis: Difficulty in walking, not elsewhere classified (R26.2);Pain Pain - Right/Left: Right Pain - part of body: Hip    Time: 6578-4696 PT Time Calculation (min) (ACUTE ONLY): 18 min   Charges:   PT Evaluation $PT Eval Low Complexity: 1 Low  Coolidge Breeze, PT, DPT [facility] Department Office: [phone] Pager: [phone]  Coolidge Breeze 02/06/2022, 5:34 PM

## 2022-02-06 NOTE — Op Note (Signed)
PRE-OP DIAGNOSIS:  RIGHT HIP DEGENERATIVE JOINT DISEASE POST-OP DIAGNOSIS:  same PROCEDURE: RIGHT TOTAL HIP ARTHROPLASTY ANTERIOR APPROACH ANESTHESIA:  General SURGEON:  Melrose Nakayama MD ASSISTANT:  Loni Dolly PA-C   INDICATIONS FOR PROCEDURE:  The patient is a 71 y.o. female with a long history of a painful hip.  This has persisted despite multiple conservative measures.  The patient has persisted with pain and dysfunction making rest and activity difficult.  A total hip replacement is offered as surgical treatment.  Informed operative consent was obtained after discussion of possible complications including reaction to anesthesia, infection, neurovascular injury, dislocation, DVT, PE, and death.  The importance of the postoperative rehab program to optimize result was stressed with the patient.  SUMMARY OF FINDINGS AND PROCEDURE:  Under the above anesthesia through a anterior approach an the Hana table a right THR was performed.  The patient had severe degenerative change and good bone quality.  We used DePuy components to replace the hip and these were size 4 Actis femur capped with a +5 30m ceramic hip ball.  On the acetabular side we used a size 50 Gription shell with a  plus 0 neutral polyethylene liner.  We did use a hole eliminator.  ALoni DollyPA-C assisted throughout and was invaluable to the completion of the case in that he helped position and retract while I performed the procedure.  He also closed simultaneously to help minimize OR time.  I used fluoroscopy throughout the case to check position of implants and leg lengths and read all of these views myself.  DESCRIPTION OF PROCEDURE:  The patient was taken to the OR suite where the above anesthetic was applied.  The patient was then positioned on the Hana table supine.  All bony prominences were appropriately padded.  Prep and drape was then performed in normal sterile fashion.  The patient was given kefzol preoperative antibiotic and  an appropriate time out was performed.  We then took an anterior approach to the right hip.  Dissection was taken through adipose to the tensor fascia lata fascia.  This structure was incised longitudinally and we dissected in the intermuscular interval just medial to this muscle.  Cobra retractors were placed superior and inferior to the femoral neck superficial to the capsule.  A capsular incision was then made and the retractors were placed along the femoral neck.  Xray was brought in to get a good level for the femoral neck cut which was made with an oscillating saw and osteotome.  The femoral head was removed with a corkscrew.  The acetabulum was exposed and some labral tissues were excised. Reaming was taken to the inside wall of the pelvis and sequentially up to 1 mm smaller than the actual component.  A trial of components was done and then the aforementioned acetabular shell was placed in appropriate tilt and anteversion confirmed by fluoroscopy. The liner was placed along with the hole eliminator and attention was turned to the femur.  The leg was brought down and over into adduction and the elevator bar was used to raise the femur up gently in the wound.  The piriformis was released with care taken to preserve the obturator internus attachment and all of the posterior capsule. The femur was reamed and then broached to the appropriate size.  A trial reduction was done and the aforementioned head and neck assembly gave uKoreathe best stability in extension with external rotation.  Leg lengths were felt to be about equal by fluoroscopic  exam.  The trial components were removed and the wound irrigated.  We then placed the femoral component in appropriate anteversion.  The head was applied to a dry stem neck and the hip again reduced.  It was again stable in the aforementioned position.  The would was irrigated again followed by re-approximation of anterior capsule with ethibond suture. Tensor fascia was repaired  with V-loc suture  followed by deep closure with #O and #2 undyed vicryl.  Skin was closed with subQ stitch and steristrips followed by a sterile dressing.  EBL and IOF can be obtained from anesthesia records.  DISPOSITION:  The patient was taken to PACU in stable condition to potentially go home same day depending on ability to walk and tolerate liquids.

## 2022-02-06 NOTE — Anesthesia Preprocedure Evaluation (Addendum)
Anesthesia Evaluation  Patient identified by MRN, date of birth, ID band Patient awake    Reviewed: Allergy & Precautions, H&P , NPO status , Patient's Chart, lab work & pertinent test results  History of Anesthesia Complications (+) PONV and history of anesthetic complications  Airway Mallampati: II  TM Distance: >3 FB Neck ROM: Full    Dental no notable dental hx. (+) Teeth Intact, Dental Advisory Given   Pulmonary sleep apnea , former smoker,    Pulmonary exam normal breath sounds clear to auscultation       Cardiovascular negative cardio ROS   Rhythm:Regular Rate:Normal     Neuro/Psych Anxiety Depression negative neurological ROS     GI/Hepatic Neg liver ROS, GERD  Medicated,  Endo/Other  Hypothyroidism   Renal/GU negative Renal ROS  negative genitourinary   Musculoskeletal   Abdominal   Peds  Hematology negative hematology ROS (+)   Anesthesia Other Findings   Reproductive/Obstetrics negative OB ROS                            Anesthesia Physical Anesthesia Plan  ASA: 2  Anesthesia Plan: General   Post-op Pain Management: Tylenol PO (pre-op)*   Induction: Intravenous  PONV Risk Score and Plan: 4 or greater and Ondansetron, Dexamethasone, Propofol infusion and TIVA  Airway Management Planned: Oral ETT  Additional Equipment:   Intra-op Plan:   Post-operative Plan: Extubation in OR  Informed Consent: I have reviewed the patients History and Physical, chart, labs and discussed the procedure including the risks, benefits and alternatives for the proposed anesthesia with the patient or authorized representative who has indicated his/her understanding and acceptance.     Dental advisory given  Plan Discussed with: CRNA  Anesthesia Plan Comments:        Anesthesia Quick Evaluation

## 2022-02-06 NOTE — Interval H&P Note (Signed)
History and Physical Interval Note:  02/06/2022 9:01 AM  Leslie Ryan  has presented today for surgery, with the diagnosis of RIGHT HIP DEGENERATIVE JOINT DISEASE.  The various methods of treatment have been discussed with the patient and family. After consideration of risks, benefits and other options for treatment, the patient has consented to  Procedure(s): RIGHT TOTAL HIP ARTHROPLASTY ANTERIOR APPROACH (Right) as a surgical intervention.  The patient's history has been reviewed, patient examined, no change in status, stable for surgery.  I have reviewed the patient's chart and labs.  Questions were answered to the patient's satisfaction.     Hessie Dibble

## 2022-02-06 NOTE — Transfer of Care (Signed)
Immediate Anesthesia Transfer of Care Note  Patient: Leslie Ryan  Procedure(s) Performed: RIGHT TOTAL HIP ARTHROPLASTY ANTERIOR APPROACH (Right: Hip)  Patient Location: PACU  Anesthesia Type:General  Level of Consciousness: drowsy and patient cooperative  Airway & Oxygen Therapy: Patient Spontanous Breathing and Patient connected to face mask oxygen  Post-op Assessment: Report given to RN and Post -op Vital signs reviewed and stable  Post vital signs: Reviewed and stable  Last Vitals:  Vitals Value Taken Time  BP 147/90 02/06/22 1126  Temp 36.4 C 02/06/22 1126  Pulse 82 02/06/22 1127  Resp 9 02/06/22 1127  SpO2 99 % 02/06/22 1127  Vitals shown include unvalidated device data.  Last Pain:  Vitals:   02/06/22 0816  TempSrc: Oral  PainSc:       Patients Stated Pain Goal: 3 (81/44/81 8563)  Complications: No notable events documented.

## 2022-02-07 ENCOUNTER — Encounter (HOSPITAL_COMMUNITY): Payer: Self-pay | Admitting: Orthopaedic Surgery

## 2022-02-07 DIAGNOSIS — Z7982 Long term (current) use of aspirin: Secondary | ICD-10-CM | POA: Diagnosis not present

## 2022-02-07 DIAGNOSIS — M1611 Unilateral primary osteoarthritis, right hip: Secondary | ICD-10-CM | POA: Diagnosis not present

## 2022-02-07 DIAGNOSIS — Z87891 Personal history of nicotine dependence: Secondary | ICD-10-CM | POA: Diagnosis not present

## 2022-02-07 DIAGNOSIS — E039 Hypothyroidism, unspecified: Secondary | ICD-10-CM | POA: Diagnosis not present

## 2022-02-07 DIAGNOSIS — Z79899 Other long term (current) drug therapy: Secondary | ICD-10-CM | POA: Diagnosis not present

## 2022-02-07 MED ORDER — TIZANIDINE HCL 4 MG PO TABS
4.0000 mg | ORAL_TABLET | Freq: Four times a day (QID) | ORAL | 1 refills | Status: DC | PRN
Start: 2022-02-07 — End: 2022-03-15

## 2022-02-07 MED ORDER — ASPIRIN 81 MG PO TABS: 81.0000 mg | ORAL_TABLET | Freq: Two times a day (BID) | ORAL | 0 refills | Status: AC

## 2022-02-07 MED ORDER — HYDROGEN PEROXIDE 3 % EX SOLN
CUTANEOUS | Status: AC
  Filled 2022-02-07: qty 473

## 2022-02-07 MED ORDER — PROMETHAZINE HCL 12.5 MG PO TABS: 12.5000 mg | ORAL_TABLET | Freq: Four times a day (QID) | ORAL | 0 refills | Status: DC | PRN

## 2022-02-07 NOTE — Progress Notes (Signed)
Physical Therapy Treatment Patient Details Name: Leslie Ryan MRN: [id number] DOB: November 28, 1950 Today's Date: 02/07/2022   History of Present Illness Pt is a 71yo female presenting s/pt R-THA, AA on 02/06/22. PMH: cervical dysplasia, anxiety & depression, GERD, hypothyroidism, multiple sclerosis, PONV, R-RCR 2015.    PT Comments     Patient reports pain much better controlled with muscle relaxer, had PO meds again also. Patient ambukated x 400'. Patient's famuily present verbally and visually reviewed HEP. Patientreports  she will have HHPT. Patient has met PT goals for DC.   Recommendations for follow up therapy are one component of a multi-disciplinary discharge planning process, led by the attending physician.  Recommendations may be updated based on patient status, additional functional criteria and insurance authorization.  Follow Up Recommendations  Follow physician's recommendations for discharge plan and follow up therapies     Assistance Recommended at Discharge Intermittent Supervision/Assistance  Patient can return home with the following A little help with walking and/or transfers;A little help with bathing/dressing/bathroom;Assistance with cooking/housework;Assist for transportation;Help with stairs or ramp for entrance   Equipment Recommendations  None recommended by PT    Recommendations for Other Services       Precautions / Restrictions Precautions Precautions: Fall Restrictions RLE Weight Bearing: Weight bearing as tolerated     Mobility  Bed Mobility   Bed Mobility: Supine to Sit, Sit to Supine           General bed mobility comments: used belt to self support RLE onto and off of bed    Transfers Overall transfer level: Needs assistance Equipment used: Rolling walker (2 wheels) Transfers: Sit to/from Stand Sit to Stand: Supervision           General transfer comment: cues for hand placement and right leg position     Ambulation/Gait Ambulation/Gait assistance: Min guard, Min assist Gait Distance (Feet): 30 Feet (then 400') Assistive device: Rolling walker (2 wheels) Gait Pattern/deviations: Step-through pattern Gait velocity: decr     General Gait Details: cues to stay closer and inside RW for safety. Patient reported right leg buckled slightly   Stairs             Wheelchair Mobility    Modified Rankin (Stroke Patients Only)       Balance Overall balance assessment: Mild deficits observed, not formally tested                                          Cognition Arousal/Alertness: Awake/alert Behavior During Therapy: WFL for tasks assessed/performed                                   General Comments: tearful in pain after ambulating        Exercises      General Comments        Pertinent Vitals/Pain Pain Assessment Pain Score: 5  Pain Location: R hip Pain Descriptors / Indicators: Operative site guarding, Discomfort, Grimacing Pain Intervention(s): Monitored during session, Premedicated before session    Home Living                          Prior Function            PT Goals (current goals can now be found in the care plan section)  Progress towards PT goals: Progressing toward goals    Frequency    7X/week      PT Plan Current plan remains appropriate    Co-evaluation              AM-PAC PT "6 Clicks" Mobility   Outcome Measure  Help needed turning from your back to your side while in a flat bed without using bedrails?: A Little Help needed moving from lying on your back to sitting on the side of a flat bed without using bedrails?: A Little Help needed moving to and from a bed to a chair (including a wheelchair)?: A Little Help needed standing up from a chair using your arms (e.g., wheelchair or bedside chair)?: A Little Help needed to walk in hospital room?: A Little Help needed climbing 3-5  steps with a railing? : A Little 6 Click Score: 18    End of Session Equipment Utilized During Treatment: Gait belt Activity Tolerance: Patient tolerated treatment well Patient left: in chair;with call bell/phone within reach;with family/visitor present Nurse Communication: Mobility status PT Visit Diagnosis: Difficulty in walking, not elsewhere classified (R26.2);Pain Pain - Right/Left: Right Pain - part of body: Hip     Time: 1245-1317 PT Time Calculation (min) (ACUTE ONLY): 32 min  Charges:  $Gait Training: 8-22 mins $Therapeutic Exercise: 8-22 mins                      [place] Office [phone] Weekend [phone]    Claretha Cooper 02/07/2022, 1:47 PM

## 2022-02-07 NOTE — Progress Notes (Signed)
Physical Therapy Treatment Patient Details Name: Leslie Ryan MRN: [id number] DOB: 1951-07-02 Today's Date: 02/07/2022   History of Present Illness Pt is a 71yo female presenting s/pt R-THA, AA on 02/06/22. PMH: cervical dysplasia, anxiety & depression, GERD, hypothyroidism, multiple sclerosis, PONV, R-RCR 2015.    PT Comments    Patient reporting right hip/leg pain is excruciating after ambulating to BR, assisted repositioning in recliner, RN gave robaxin. Will attempt ambulation  again at 12:30 and see how she progresses. Patient tearful and expresses concern about going home today.   Recommendations for follow up therapy are one component of a multi-disciplinary discharge planning process, led by the attending physician.  Recommendations may be updated based on patient status, additional functional criteria and insurance authorization.  Follow Up Recommendations  Follow physician's recommendations for discharge plan and follow up therapies     Assistance Recommended at Discharge    Patient can return home with the following A little help with walking and/or transfers;A little help with bathing/dressing/bathroom;Assistance with cooking/housework;Assist for transportation;Help with stairs or ramp for entrance   Equipment Recommendations  BSC/3in1    Recommendations for Other Services       Precautions / Restrictions Precautions Precautions: Fall Restrictions RLE Weight Bearing: Weight bearing as tolerated     Mobility  Bed Mobility               General bed mobility comments: in recliner, attempted to stand, in too much pain,. adjusted recliner for comfort,    Transfers                   General transfer comment: deferred, in pain    Ambulation/Gait                   Stairs             Wheelchair Mobility    Modified Rankin (Stroke Patients Only)       Balance                                             Cognition Arousal/Alertness: Awake/alert Behavior During Therapy: Anxious                                   General Comments: tearful in pain after ambulating        Exercises      General Comments        Pertinent Vitals/Pain Pain Assessment Pain Score: 10-Worst pain ever Pain Location: R hip Pain Descriptors / Indicators: Operative site guarding, Discomfort, Grimacing Pain Intervention(s): Monitored during session, Premedicated before session, RN gave pain meds during session, Ice applied, Limited activity within patient's tolerance    Home Living                          Prior Function            PT Goals (current goals can now be found in the care plan section) Progress towards PT goals: Not progressing toward goals - comment (pain right hip)    Frequency    7X/week      PT Plan Current plan remains appropriate    Co-evaluation              AM-PAC PT "6  Clicks" Mobility   Outcome Measure  Help needed turning from your back to your side while in a flat bed without using bedrails?: A Little Help needed moving from lying on your back to sitting on the side of a flat bed without using bedrails?: A Little Help needed moving to and from a bed to a chair (including a wheelchair)?: A Little Help needed standing up from a chair using your arms (e.g., wheelchair or bedside chair)?: A Little Help needed to walk in hospital room?: A Lot Help needed climbing 3-5 steps with a railing? : A Lot 6 Click Score: 16    End of Session   Activity Tolerance: Patient limited by pain Patient left: in chair;with call bell/phone within reach Nurse Communication: Mobility status;Patient requests pain meds PT Visit Diagnosis: Difficulty in walking, not elsewhere classified (R26.2);Pain Pain - Right/Left: Right Pain - part of body: Hip     Time: 1037-1100 PT Time Calculation (min) (ACUTE ONLY): 23 min  Charges:  $Self Care/Home Management:  [place] Office [phone] Weekend [phone]    Claretha Cooper 02/07/2022, 12:28 PM

## 2022-02-07 NOTE — Discharge Summary (Signed)
Patient ID: Leslie Ryan MRN: [id number] DOB/AGE: 1951/08/17 71 y.o.  Admit date: 02/06/2022 Discharge date: 02/07/2022  Admission Diagnoses:  Principal Problem:   Primary osteoarthritis of right hip   Discharge Diagnoses:  Same  Past Medical History:  Diagnosis Date   Anxiety    Celiac disease    Cervical dysplasia    Claustrophobia    Complication of anesthesia    slow to awaken, doesn't want Propofol- when she had it before, had to be reminded to breath.   Depression    Family history of adverse reaction to anesthesia    mother difficulty intubation   GERD (gastroesophageal reflux disease)    Hearing loss    Hypothyroidism    Multiple sclerosis (HCC)    PONV (postoperative nausea and vomiting)    Sleep apnea    C-Pap   Vision abnormalities     Surgeries: Procedure(s): RIGHT TOTAL HIP ARTHROPLASTY ANTERIOR APPROACH on 02/06/2022   Consultants:   Discharged Condition: Improved  Hospital Course: Jules Vidovich is an 71 y.o. female who was admitted 02/06/2022 for operative treatment ofPrimary osteoarthritis of right hip. Patient has severe unremitting pain that affects sleep, daily activities, and work/hobbies. After pre-op clearance the patient was taken to the operating room on 02/06/2022 and underwent  Procedure(s): RIGHT TOTAL HIP ARTHROPLASTY ANTERIOR APPROACH.    Patient was given perioperative antibiotics:  Anti-infectives (From admission, onward)    Start     Dose/Rate Route Frequency Ordered Stop   02/06/22 1600  ceFAZolin (ANCEF) IVPB 2g/100 mL premix        2 g 200 mL/hr over 30 Minutes Intravenous Every 6 hours 02/06/22 1326 02/06/22 2123   02/06/22 0745  ceFAZolin (ANCEF) IVPB 2g/100 mL premix        2 g 200 mL/hr over 30 Minutes Intravenous On call to O.R. 02/06/22 0732 02/06/22 1022        Patient was given sequential compression devices, early ambulation, and chemoprophylaxis to prevent DVT.  Patient benefited maximally from  hospital stay and there were no complications.    Recent vital signs: Patient Vitals for the past 24 hrs:  BP Temp Temp src Pulse Resp SpO2  02/07/22 0449 (!) 153/69 97.7 F (36.5 C) Oral 64 18 100 %  02/07/22 0206 135/74 97.8 F (36.6 C) -- 74 18 98 %  02/06/22 2255 -- -- -- -- -- 93 %  02/06/22 2136 (!) 149/67 97.6 F (36.4 C) Oral 64 18 93 %  02/06/22 1731 (!) 145/75 97.7 F (36.5 C) Oral 64 18 94 %  02/06/22 1540 120/65 97.6 F (36.4 C) Oral 68 16 96 %  02/06/22 1327 113/68 97.9 F (36.6 C) Oral 71 16 96 %  02/06/22 1245 (!) 112/59 97.6 F (36.4 C) -- 70 11 97 %  02/06/22 1230 126/65 -- -- 66 16 98 %  02/06/22 1215 129/67 -- -- 69 12 100 %  02/06/22 1200 138/63 -- -- 74 12 99 %  02/06/22 1145 (!) 132/92 -- -- 69 20 99 %  02/06/22 1130 (!) 140/96 -- -- 67 12 99 %  02/06/22 1126 (!) 147/90 (!) 97.5 F (36.4 C) -- 78 13 100 %     Recent laboratory studies: No results for input(s): "WBC", "HGB", "HCT", "PLT", "NA", "K", "CL", "CO2", "BUN", "CREATININE", "GLUCOSE", "INR", "CALCIUM" in the last 72 hours.  Invalid input(s): "PT", "2"   Discharge Medications:   Allergies as of 02/07/2022       Reactions  Erythromycin Other (See Comments)   Abdominal cramping   Flexeril [cyclobenzaprine]    Severe constipation   Propofol Other (See Comments)   Has trouble waking up   Morphine And Related    Patient described heaviness and loss of feeling in legs coupled with a squeezing feeling in the epigastric region. Requested no further Morphine doses.           Medication List     TAKE these medications    aspirin 81 MG tablet Take 1 tablet (81 mg total) by mouth 2 (two) times daily. For 2 weeks then back to once a day after that for DVT prevention. What changed:  when to take this additional instructions   Aubagio 14 MG Tabs Generic drug: Teriflunomide Take 14 mg by mouth daily.   diazepam 5 MG tablet Commonly known as: VALIUM One or two po daily What changed:  how  much to take how to take this when to take this additional instructions   FLUoxetine 20 MG capsule Commonly known as: PROZAC Take 1 capsule (20 mg total) by mouth daily.   lansoprazole 30 MG capsule Commonly known as: PREVACID Take 30 mg by mouth daily.   levothyroxine 112 MCG tablet Commonly known as: SYNTHROID Take 112 mcg by mouth daily.   oxyCODONE-acetaminophen 5-325 MG tablet Commonly known as: PERCOCET/ROXICET Take 1 tablet by mouth every 4 (four) hours as needed for severe pain.   potassium chloride SA 20 MEQ tablet Commonly known as: KLOR-CON M Take 20 mEq by mouth 2 (two) times daily.   pravastatin 20 MG tablet Commonly known as: PRAVACHOL Take 20 mg by mouth daily.   PROBIOTIC PO Take 1 capsule by mouth daily.   promethazine 12.5 MG tablet Commonly known as: PHENERGAN Take 1-2 tablets (12.5-25 mg total) by mouth every 6 (six) hours as needed for nausea or vomiting.   tiZANidine 4 MG tablet Commonly known as: Zanaflex Take 1 tablet (4 mg total) by mouth every 6 (six) hours as needed for muscle spasms.   triamterene-hydrochlorothiazide 37.5-25 MG capsule Commonly known as: Dyazide Take 1 each (1 capsule total) by mouth daily.   Vitamin D-3 25 MCG (1000 UT) Caps Take 2,000 tablets by mouth daily.   vitamin E 180 MG (400 UNITS) capsule Take 400 Units by mouth daily.   zolpidem 12.5 MG CR tablet Commonly known as: Ambien CR Take 1 tablet (12.5 mg total) by mouth at bedtime as needed for sleep. What changed: when to take this               Durable Medical Equipment  (From admission, onward)           Start     Ordered   02/06/22 1327  DME Walker rolling  Once       Question:  Patient needs a walker to treat with the following condition  Answer:  Primary osteoarthritis of right hip   02/06/22 1326   02/06/22 1327  DME 3 n 1  Once        02/06/22 1326   02/06/22 1327  DME Bedside commode  Once       Question:  Patient needs a bedside  commode to treat with the following condition  Answer:  Primary osteoarthritis of right hip   02/06/22 1326            Diagnostic Studies: DG HIP UNILAT WITH PELVIS 2-3 VIEWS RIGHT  Result Date: 02/06/2022 CLINICAL DATA:  Right hip replacement. EXAM: DG  HIP (WITH OR WITHOUT PELVIS) 2-3V RIGHT COMPARISON:  Right hip x-rays dated Dec 29, 2021. FLUOROSCOPY TIME:  Radiation Exposure Index (as provided by the fluoroscopic device): 1.83 mGy Kerma C-arm fluoroscopic images were obtained intraoperatively and submitted for post operative interpretation. FINDINGS: Multiple intraoperative fluoroscopic images demonstrate interval right total hip arthroplasty. Components are well aligned. No acute osseous abnormality. IMPRESSION: 1. Intraoperative fluoroscopic guidance for right total hip arthroplasty. Electronically Signed   By: Titus Dubin M.D.   On: 02/06/2022 11:10   DG C-Arm 1-60 Min-No Report  Result Date: 02/06/2022 Fluoroscopy was utilized by the requesting physician.  No radiographic interpretation.   DG C-Arm 1-60 Min-No Report  Result Date: 02/06/2022 Fluoroscopy was utilized by the requesting physician.  No radiographic interpretation.   DG Chest 2 View  Result Date: 02/01/2022 CLINICAL DATA:  Preoperative evaluation EXAM: CHEST - 2 VIEW COMPARISON:  Chest radiograph 11/27/2012 FINDINGS: The heart size and mediastinal contours are within normal limits. Both lungs are clear. The visualized skeletal structures are unremarkable. IMPRESSION: No active cardiopulmonary disease. Electronically Signed   By: Lovey Newcomer M.D.   On: 02/01/2022 12:51    Disposition: Discharge disposition: 01-Home or Self Care       Discharge Instructions     Call MD / Call 911   Complete by: As directed    If you experience chest pain or shortness of breath, CALL 911 and be transported to the hospital emergency room.  If you develope a fever above 101 F, pus (white drainage) or increased drainage or  redness at the wound, or calf pain, call your surgeon's office.   Constipation Prevention   Complete by: As directed    Drink plenty of fluids.  Prune juice may be helpful.  You may use a stool softener, such as Colace (over the counter) 100 mg twice a day.  Use MiraLax (over the counter) for constipation as needed.   Diet - low sodium heart healthy   Complete by: As directed    Discharge instructions   Complete by: As directed    INSTRUCTIONS AFTER JOINT REPLACEMENT   Remove items at home which could result in a fall. This includes throw rugs or furniture in walking pathways ICE to the affected joint every three hours while awake for 30 minutes at a time, for at least the first 3-5 days, and then as needed for pain and swelling.  Continue to use ice for pain and swelling. You may notice swelling that will progress down to the foot and ankle.  This is normal after surgery.  Elevate your leg when you are not up walking on it.   Continue to use the breathing machine you got in the hospital (incentive spirometer) which will help keep your temperature down.  It is common for your temperature to cycle up and down following surgery, especially at night when you are not up moving around and exerting yourself.  The breathing machine keeps your lungs expanded and your temperature down.   DIET:  As you were doing prior to hospitalization, we recommend a well-balanced diet.  DRESSING / WOUND CARE / SHOWERING  You may shower 3 days after surgery, but keep the wounds dry during showering.  You may use an occlusive plastic wrap (Press'n Seal for example), NO SOAKING/SUBMERGING IN THE BATHTUB.  If the bandage gets wet, change with a clean dry gauze.  If the incision gets wet, pat the wound dry with a clean towel.  ACTIVITY  Increase activity  slowly as tolerated, but follow the weight bearing instructions below.   No driving for 6 weeks or until further direction given by your physician.  You cannot drive  while taking narcotics.  No lifting or carrying greater than 10 lbs. until further directed by your surgeon. Avoid periods of inactivity such as sitting longer than an hour when not asleep. This helps prevent blood clots.  You may return to work once you are authorized by your doctor.     WEIGHT BEARING   Weight bearing as tolerated with assist device (walker, cane, etc) as directed, use it as long as suggested by your surgeon or therapist, typically at least 4-6 weeks.   EXERCISES  Results after joint replacement surgery are often greatly improved when you follow the exercise, range of motion and muscle strengthening exercises prescribed by your doctor. Safety measures are also important to protect the joint from further injury. Any time any of these exercises cause you to have increased pain or swelling, decrease what you are doing until you are comfortable again and then slowly increase them. If you have problems or questions, call your caregiver or physical therapist for advice.   Rehabilitation is important following a joint replacement. After just a few days of immobilization, the muscles of the leg can become weakened and shrink (atrophy).  These exercises are designed to build up the tone and strength of the thigh and leg muscles and to improve motion. Often times heat used for twenty to thirty minutes before working out will loosen up your tissues and help with improving the range of motion but do not use heat for the first two weeks following surgery (sometimes heat can increase post-operative swelling).   These exercises can be done on a training (exercise) mat, on the floor, on a table or on a bed. Use whatever works the best and is most comfortable for you.    Use music or television while you are exercising so that the exercises are a pleasant break in your day. This will make your life better with the exercises acting as a break in your routine that you can look forward to.   Perform  all exercises about fifteen times, three times per day or as directed.  You should exercise both the operative leg and the other leg as well.  Exercises include:   Quad Sets - Tighten up the muscle on the front of the thigh (Quad) and hold for 5-10 seconds.   Straight Leg Raises - With your knee straight (if you were given a brace, keep it on), lift the leg to 60 degrees, hold for 3 seconds, and slowly lower the leg.  Perform this exercise against resistance later as your leg gets stronger.  Leg Slides: Lying on your back, slowly slide your foot toward your buttocks, bending your knee up off the floor (only go as far as is comfortable). Then slowly slide your foot back down until your leg is flat on the floor again.  Angel Wings: Lying on your back spread your legs to the side as far apart as you can without causing discomfort.  Hamstring Strength:  Lying on your back, push your heel against the floor with your leg straight by tightening up the muscles of your buttocks.  Repeat, but this time bend your knee to a comfortable angle, and push your heel against the floor.  You may put a pillow under the heel to make it more comfortable if necessary.   A rehabilitation  program following joint replacement surgery can speed recovery and prevent re-injury in the future due to weakened muscles. Contact your doctor or a physical therapist for more information on knee rehabilitation.    CONSTIPATION  Constipation is defined medically as fewer than three stools per week and severe constipation as less than one stool per week.  Even if you have a regular bowel pattern at home, your normal regimen is likely to be disrupted due to multiple reasons following surgery.  Combination of anesthesia, postoperative narcotics, change in appetite and fluid intake all can affect your bowels.   YOU MUST use at least one of the following options; they are listed in order of increasing strength to get the job done.  They are  all available over the counter, and you may need to use some, POSSIBLY even all of these options:    Drink plenty of fluids (prune juice may be helpful) and high fiber foods Colace 100 mg by mouth twice a day  Senokot for constipation as directed and as needed Dulcolax (bisacodyl), take with full glass of water  Miralax (polyethylene glycol) once or twice a day as needed.  If you have tried all these things and are unable to have a bowel movement in the first 3-4 days after surgery call either your surgeon or your primary doctor.    If you experience loose stools or diarrhea, hold the medications until you stool forms back up.  If your symptoms do not get better within 1 week or if they get worse, check with your doctor.  If you experience "the worst abdominal pain ever" or develop nausea or vomiting, please contact the office immediately for further recommendations for treatment.   ITCHING:  If you experience itching with your medications, try taking only a single pain pill, or even half a pain pill at a time.  You can also use Benadryl over the counter for itching or also to help with sleep.   TED HOSE STOCKINGS:  Use stockings on both legs until for at least 2 weeks or as directed by physician office. They may be removed at night for sleeping.  MEDICATIONS:  See your medication summary on the "After Visit Summary" that nursing will review with you.  You may have some home medications which will be placed on hold until you complete the course of blood thinner medication.  It is important for you to complete the blood thinner medication as prescribed.  PRECAUTIONS:  If you experience chest pain or shortness of breath - call 911 immediately for transfer to the hospital emergency department.   If you develop a fever greater that 101 F, purulent drainage from wound, increased redness or drainage from wound, foul odor from the wound/dressing, or calf pain - CONTACT YOUR SURGEON.                                                    FOLLOW-UP APPOINTMENTS:  If you do not already have a post-op appointment, please call the office for an appointment to be seen by your surgeon.  Guidelines for how soon to be seen are listed in your "After Visit Summary", but are typically between 1-4 weeks after surgery.  OTHER INSTRUCTIONS:   Knee Replacement:  Do not place pillow under knee, focus on keeping the knee straight while resting. CPM instructions:  0-90 degrees, 2 hours in the morning, 2 hours in the afternoon, and 2 hours in the evening. Place foam block, curve side up under heel at all times except when in CPM or when walking.  DO NOT modify, tear, cut, or change the foam block in any way.  POST-OPERATIVE OPIOID TAPER INSTRUCTIONS: It is important to wean off of your opioid medication as soon as possible. If you do not need pain medication after your surgery it is ok to stop day one. Opioids include: Codeine, Hydrocodone(Norco, Vicodin), Oxycodone(Percocet, oxycontin) and hydromorphone amongst others.  Long term and even short term use of opiods can cause: Increased pain response Dependence Constipation Depression Respiratory depression And more.  Withdrawal symptoms can include Flu like symptoms Nausea, vomiting And more Techniques to manage these symptoms Hydrate well Eat regular healthy meals Stay active Use relaxation techniques(deep breathing, meditating, yoga) Do Not substitute Alcohol to help with tapering If you have been on opioids for less than two weeks and do not have pain than it is ok to stop all together.  Plan to wean off of opioids This plan should start within one week post op of your joint replacement. Maintain the same interval or time between taking each dose and first decrease the dose.  Cut the total daily intake of opioids by one tablet each day Next start to increase the time between doses. The last dose that should be eliminated is the evening dose.     MAKE  SURE YOU:  Understand these instructions.  Get help right away if you are not doing well or get worse.    Thank you for letting us be a part of your medical care team.  It is a privilege we respect greatly.  We hope these instructions will help you stay on track for a fast and full recovery!   Increase activity slowly as tolerated   Complete by: As directed    Post-operative opioid taper instructions:   Complete by: As directed    POST-OPERATIVE OPIOID TAPER INSTRUCTIONS: It is important to wean off of your opioid medication as soon as possible. If you do not need pain medication after your surgery it is ok to stop day one. Opioids include: Codeine, Hydrocodone(Norco, Vicodin), Oxycodone(Percocet, oxycontin) and hydromorphone amongst others.  Long term and even short term use of opiods can cause: Increased pain response Dependence Constipation Depression Respiratory depression And more.  Withdrawal symptoms can include Flu like symptoms Nausea, vomiting And more Techniques to manage these symptoms Hydrate well Eat regular healthy meals Stay active Use relaxation techniques(deep breathing, meditating, yoga) Do Not substitute Alcohol to help with tapering If you have been on opioids for less than two weeks and do not have pain than it is ok to stop all together.  Plan to wean off of opioids This plan should start within one week post op of your joint replacement. Maintain the same interval or time between taking each dose and first decrease the dose.  Cut the total daily intake of opioids by one tablet each day Next start to increase the time between doses. The last dose that should be eliminated is the evening dose.           Follow-up Information     Melrose Nakayama, MD. Schedule an appointment as soon as possible for a visit in 2 week(s).   Specialty: Orthopedic Surgery Contact information: [place] [address] [phone]  Signed: Larwance Sachs Katera Rybka 02/07/2022, 8:24 AM

## 2022-02-07 NOTE — Progress Notes (Signed)
Discharge instructions reviewed with patient and dtr. All questions and concerns addressed. Medicated for pain prior to departure. All belongings given. Vital signs stable. IV removed per protocol, tolerated well intact.

## 2022-02-07 NOTE — TOC Transition Note (Signed)
Transition of Care [facility]) - CM/SW Discharge Note   Patient Details  Name: Leslie Ryan MRN: [id number] Date of Birth: 09-Apr-1951  [facility] CM/SW Contact:  Lennart Pall, LCSW Phone Number: 02/07/2022, 9:36 AM   Clinical Narrative:    Met with pt and confirming she has all needed DME at home.  She is aware that HHPT has been prearranged with [facility].  No further TOC needs.   Final next level of care: [place] Barriers to Discharge: No Barriers Identified   Patient Goals and CMS Choice Patient states their goals for this hospitalization and ongoing recovery are:: return home      Discharge Placement                       Discharge Plan and Services                DME Arranged: N/A DME Agency: NA       HH Arranged: PT [place] Agency: [place]        Social Determinants of Health (SDOH) Interventions     Readmission Risk Interventions     No data to display

## 2022-02-07 NOTE — Progress Notes (Signed)
Subjective: 1 Day Post-Op Procedure(s) (LRB): RIGHT TOTAL HIP ARTHROPLASTY ANTERIOR APPROACH (Right)  Patient doing well. She is hoping to go home today.  Activity level:  wbat Diet tolerance:  ok Voiding:  ok Patient reports pain as mild.    Objective: Vital signs in last 24 hours: Temp:  [97.5 F (36.4 C)-98.2 F (36.8 C)] 97.7 F (36.5 C) (06/21 0449) Pulse Rate:  [64-78] 64 (06/21 0449) Resp:  [11-20] 18 (06/21 0449) BP: (112-153)/(59-96) 153/69 (06/21 0449) SpO2:  [93 %-100 %] 100 % (06/21 0449)  Labs: No results for input(s): "HGB" in the last 72 hours. No results for input(s): "WBC", "RBC", "HCT", "PLT" in the last 72 hours. No results for input(s): "NA", "K", "CL", "CO2", "BUN", "CREATININE", "GLUCOSE", "CALCIUM" in the last 72 hours. No results for input(s): "LABPT", "INR" in the last 72 hours.  Physical Exam:  Neurologically intact ABD soft Neurovascular intact Sensation intact distally Intact pulses distally Dorsiflexion/Plantar flexion intact Incision: dressing C/D/I and no drainage No cellulitis present Compartment soft  Assessment/Plan:  1 Day Post-Op Procedure(s) (LRB): RIGHT TOTAL HIP ARTHROPLASTY ANTERIOR APPROACH (Right) Advance diet Up with therapy D/C IV fluids Discharge home with home health after PT if cleared and doing well. Follow up in office 2 weeks post op.  Continue on 79m asa BID for DVT prevention.  ALarwance SachsNida 02/07/2022, 8:15 AM

## 2022-02-07 NOTE — Plan of Care (Signed)

## 2022-02-08 DIAGNOSIS — E039 Hypothyroidism, unspecified: Secondary | ICD-10-CM | POA: Diagnosis not present

## 2022-02-08 DIAGNOSIS — K219 Gastro-esophageal reflux disease without esophagitis: Secondary | ICD-10-CM | POA: Diagnosis not present

## 2022-02-08 DIAGNOSIS — F40240 Claustrophobia: Secondary | ICD-10-CM | POA: Diagnosis not present

## 2022-02-08 DIAGNOSIS — Z471 Aftercare following joint replacement surgery: Secondary | ICD-10-CM | POA: Diagnosis not present

## 2022-02-08 DIAGNOSIS — F32A Depression, unspecified: Secondary | ICD-10-CM | POA: Diagnosis not present

## 2022-02-08 DIAGNOSIS — N879 Dysplasia of cervix uteri, unspecified: Secondary | ICD-10-CM | POA: Diagnosis not present

## 2022-02-08 DIAGNOSIS — Z96641 Presence of right artificial hip joint: Secondary | ICD-10-CM | POA: Diagnosis not present

## 2022-02-08 DIAGNOSIS — F419 Anxiety disorder, unspecified: Secondary | ICD-10-CM | POA: Diagnosis not present

## 2022-02-08 DIAGNOSIS — Z7982 Long term (current) use of aspirin: Secondary | ICD-10-CM | POA: Diagnosis not present

## 2022-02-08 DIAGNOSIS — G35 Multiple sclerosis: Secondary | ICD-10-CM | POA: Diagnosis not present

## 2022-02-08 DIAGNOSIS — K900 Celiac disease: Secondary | ICD-10-CM | POA: Diagnosis not present

## 2022-02-08 DIAGNOSIS — G4700 Insomnia, unspecified: Secondary | ICD-10-CM | POA: Diagnosis not present

## 2022-02-08 DIAGNOSIS — H9190 Unspecified hearing loss, unspecified ear: Secondary | ICD-10-CM | POA: Diagnosis not present

## 2022-02-08 DIAGNOSIS — H8109 Meniere's disease, unspecified ear: Secondary | ICD-10-CM | POA: Diagnosis not present

## 2022-02-08 DIAGNOSIS — H547 Unspecified visual loss: Secondary | ICD-10-CM | POA: Diagnosis not present

## 2022-02-08 DIAGNOSIS — G4733 Obstructive sleep apnea (adult) (pediatric): Secondary | ICD-10-CM | POA: Diagnosis not present

## 2022-02-09 DIAGNOSIS — G4733 Obstructive sleep apnea (adult) (pediatric): Secondary | ICD-10-CM | POA: Diagnosis not present

## 2022-02-16 DIAGNOSIS — Z96641 Presence of right artificial hip joint: Secondary | ICD-10-CM | POA: Diagnosis not present

## 2022-02-16 DIAGNOSIS — Z471 Aftercare following joint replacement surgery: Secondary | ICD-10-CM | POA: Diagnosis not present

## 2022-02-26 ENCOUNTER — Telehealth: Payer: Self-pay | Admitting: Family Medicine

## 2022-02-26 DIAGNOSIS — G35 Multiple sclerosis: Secondary | ICD-10-CM

## 2022-02-26 MED ORDER — TERIFLUNOMIDE 14 MG PO TABS: 14.0000 mg | ORAL_TABLET | Freq: Every day | ORAL | 3 refills | Status: DC

## 2022-02-26 MED ORDER — DIAZEPAM 5 MG PO TABS: ORAL_TABLET | ORAL | 0 refills | Status: DC

## 2022-02-26 MED ORDER — TRIAMTERENE-HCTZ 37.5-25 MG PO CAPS: 1.0000 | ORAL_CAPSULE | Freq: Every day | ORAL | 3 refills | Status: DC

## 2022-02-26 MED ORDER — ZOLPIDEM TARTRATE ER 12.5 MG PO TBCR: 12.5000 mg | EXTENDED_RELEASE_TABLET | Freq: Every evening | ORAL | 1 refills | Status: DC | PRN

## 2022-02-26 NOTE — Telephone Encounter (Signed)
E-scribed Aubagio/dyazide to pharmacy. Rx diazepam/zolpidem printed. Waiting on NP signature and will then fax to pharmacy.

## 2022-02-26 NOTE — Telephone Encounter (Signed)
Pt is requesting a refill for  Teriflunomide (AUBAGIO) 14 MG TABS, zolpidem (AMBIEN CR) 12.5 MG CR tablet, diazepam (VALIUM) 5 MG tablet & triamterene-hydrochlorothiazide (DYAZIDE) 37.5-25 MG capsule, FLUoxetine (PROZAC) 20 MG capsule.  Pharmacy:  [facility]

## 2022-03-15 ENCOUNTER — Ambulatory Visit (INDEPENDENT_AMBULATORY_CARE_PROVIDER_SITE_OTHER): Payer: Medicare Other | Admitting: Neurology

## 2022-03-15 ENCOUNTER — Encounter: Payer: Self-pay | Admitting: Neurology

## 2022-03-15 VITALS — BP 162/88 | HR 79 | Ht 63.0 in | Wt 168.0 lb

## 2022-03-15 DIAGNOSIS — R260 Ataxic gait: Secondary | ICD-10-CM

## 2022-03-15 DIAGNOSIS — E559 Vitamin D deficiency, unspecified: Secondary | ICD-10-CM | POA: Diagnosis not present

## 2022-03-15 DIAGNOSIS — G35 Multiple sclerosis: Secondary | ICD-10-CM

## 2022-03-15 DIAGNOSIS — G4700 Insomnia, unspecified: Secondary | ICD-10-CM

## 2022-03-15 DIAGNOSIS — H8109 Meniere's disease, unspecified ear: Secondary | ICD-10-CM | POA: Diagnosis not present

## 2022-03-15 DIAGNOSIS — G4733 Obstructive sleep apnea (adult) (pediatric): Secondary | ICD-10-CM

## 2022-03-15 NOTE — Progress Notes (Signed)
[facility]  PATIENT: Leslie Ryan DOB: 30-Jun-1951  REFERRING CLINICIAN: Burnard Bunting HISTORY FROM: patient   REASON FOR VISIT: MS   HISTORICAL  CHIEF COMPLAINT:  Chief Complaint  Patient presents with   Follow-up    Rm 2, alone. Here for 6 month CPAP and MS f/u, on Aubagio and tolerating well. Pts gait has been off since hip surgery, feels like she is drunk. Hip surgery 02/06/22.    Obstructive Sleep Apnea    Pt reports doing well on CPAP.     HISTORY OF PRESENT ILLNESS:  Leslie Ryan is a 71 y.o. woman with relapsing remitting multiple sclerosis.    Update 03/15/2022: She is on Aubagio for her RRMS and tolerates it well.   Multiple LFT's Iast 2 years are normal.  She denies exacerbation or new neurologic symptoms.   She has had some fluctuations in gait but no fall.      She holds the bannister with stairs.    Her right leg is slightly weaker than left and she has a foot drop if more tired.   Vision is doing well.     Bladder is the same with urgency and frequency.    MRIs in 2021 showed no new brain or cervical spine lesions.   However, MRI in 2014 showed new foci.    She has Meniere's, much better with a daily Dyazide daily (and valium if spell). She just has mild vertigo at times now.  She also sees D. Rosen.  She states she was told she had fluid behind her ear.   She has had low potassium off/on with numbers 3.0-3.4 lately.  She is on Ambien XR 12.5 for insomnia with benefit for sleep onset > maintenance    She has no hangover on Ambien but trazodone and other medications caused that. .   She is on CPAP +10 cm for OSA.     D/L shows a 30 day AHI 1.9 and compliance is 93% (100% use but 9/30 nights < 4 hours).      She is going to restart water aerobics a [facility].    She has lumbar DJD.     She had right hip replacement since last visit and feels gait is slightly worse  EPWORTH SLEEPINESS SCALE  On a scale of 0 - 3 what is the  chance of dozing:  Sitting and Reading:   0 Watching TV:    1 Sitting inactive in a public place: 0 Passenger in car for one hour: 0 Lying down to rest in the afternoon: 1 Sitting and talking to someone: 0 Sitting quietly after lunch:  0 In a car, stopped in traffic:  0  Total (out of 24):   Normal 2/24   MS History:  In the mid 1980s, she presented with right optic neuritis while working at [employer]. A lumbar puncture was inconclusive but an MRI of the brain was consistent with MS a few years later. In the 1980s and early 1990s, she received multiple rounds of IV steroids for occasional exacerbations involving vision or gait. In 1994, she started Betaseron stopped quickly due to severe injection site reactions. In January 1997, after an exacerbation she was started on Avonex and continued on Avonex for the next 15 years or so. She had some issues with compliance on some occasions.   In the late 1990s, she began to have more permanent gait issues the right foot drop that slowly worsened. I  saw early MRI reports from 2000 showing 2 foci at C6 and C7 on the cervical spine and predominantly periventricular foci on brain MRI.   On 02/20/2013, she had the sudden onset of vertigo that was not altered by movements. Her gait worsened with ataxia and she felt like she was being pushed to the right as she walked. She did not note any numbness or weakness or visual changes. An MRI of the brain on 02/25/2013 showed multiple periventricular white matter lesions consistent with MS. She received 3 days of IV steroids with benefit. I personally reviewed the MRI and felt that there were 3 new lesions in the left frontal parietal out of that were not present on her prior MRI dated 08/08/2010 the foci enhanced. Due to the exacerbation and the MRI changes, she switched to Aubagio in 2014. She tolerates it well and liver function tests have been fine.  IMAGING: MRI brain 03/06/2020 shows T2 hyperintense foci in  the hemispheres and some foci in the pons. Many of the lesions are in the periventricular white matter and some in the juxtacortical white matter, consistent with chronic demyelinating plaque associated with multiple sclerosis. There could be mild superimposed chronic microvascular ischemic change. None of the foci appear to be acute. Compared to the MRI from 06/20/2014, there is no definite changes.  MRI cervical spine 03/06/2020 shows T2 hyperintense focus in the left adjacent to T1.  This is consistent with chronic demyelinating plaque associated with multiple sclerosis.  On the sagittal images, but not clearly seen on axial images, there could be 2 additional small foci adjacent to C2-C3 and C6.      Minimal to mild spinal stenosis at C4-C5 through C6-C7 due to disc bulging, and ligamenta flava hypertrophy.  There is no nerve root compression.  REVIEW OF SYSTEMS:  Constitutional: No fevers, chills, sweats, or change in appetite Eyes: No visual changes, double vision, eye pain Ear, nose and throat: No hearing loss, ear pain, nasal congestion, sore throat Cardiovascular: No chest pain, palpitations Respiratory:  No shortness of breath at rest or with exertion.   No wheezes GastrointestinaI: No nausea, vomiting, diarrhea, abdominal pain, fecal incontinence Genitourinary:  No dysuria, urinary retention or frequency.  No nocturia. Musculoskeletal:  No neck pain, back pain Integumentary: No rash, pruritus, skin lesions Neurological: as above Psychiatric: No depression at this time.  No anxiety Endocrine: No palpitations, diaphoresis, change in appetite, change in weigh or increased thirst.   She has felt cold since thyroid med's changed Hematologic/Lymphatic:  No anemia, purpura, petechiae. Allergic/Immunologic: No itchy/runny eyes, nasal congestion, recent allergic reactions, rashes  ALLERGIES: Allergies  Allergen Reactions   Erythromycin Other (See Comments)    Abdominal cramping   Flexeril  [Cyclobenzaprine]     Severe constipation   Propofol Other (See Comments)    Has trouble waking up   Morphine And Related     Patient described heaviness and loss of feeling in legs coupled with a squeezing feeling in the epigastric region. Requested no further Morphine doses.       HOME MEDICATIONS: Outpatient Medications Prior to Visit  Medication Sig Dispense Refill   aspirin 81 MG tablet Take 1 tablet (81 mg total) by mouth 2 (two) times daily. For 2 weeks then back to once a day after that for DVT prevention. 45 tablet 0   Cholecalciferol (VITAMIN D-3) 1000 units CAPS Take 2,000 tablets by mouth daily.     diazepam (VALIUM) 5 MG tablet One or two po daily  90 tablet 0   FLUoxetine (PROZAC) 20 MG capsule Take 1 capsule (20 mg total) by mouth daily. 90 capsule 4   lansoprazole (PREVACID) 30 MG capsule Take 30 mg by mouth daily.     levothyroxine (SYNTHROID, LEVOTHROID) 112 MCG tablet Take 112 mcg by mouth daily.     potassium chloride SA (K-DUR,KLOR-CON) 20 MEQ tablet Take 20 mEq by mouth 2 (two) times daily.     pravastatin (PRAVACHOL) 20 MG tablet Take 20 mg by mouth daily.     Probiotic Product (PROBIOTIC PO) Take 1 capsule by mouth daily.     Teriflunomide (AUBAGIO) 14 MG TABS Take 14 mg by mouth daily. 90 tablet 3   triamterene-hydrochlorothiazide (DYAZIDE) 37.5-25 MG capsule Take 1 each (1 capsule total) by mouth daily. 90 capsule 3   vitamin E 400 UNIT capsule Take 400 Units by mouth daily.     zolpidem (AMBIEN CR) 12.5 MG CR tablet Take 1 tablet (12.5 mg total) by mouth at bedtime as needed for sleep. 90 tablet 1   oxyCODONE-acetaminophen (PERCOCET/ROXICET) 5-325 MG tablet Take 1 tablet by mouth every 4 (four) hours as needed for severe pain. 15 tablet 0   promethazine (PHENERGAN) 12.5 MG tablet Take 1-2 tablets (12.5-25 mg total) by mouth every 6 (six) hours as needed for nausea or vomiting. 30 tablet 0   tiZANidine (ZANAFLEX) 4 MG tablet Take 1 tablet (4 mg total) by mouth every  6 (six) hours as needed for muscle spasms. 40 tablet 1   No facility-administered medications prior to visit.    PAST MEDICAL HISTORY: Past Medical History:  Diagnosis Date   Anxiety    Celiac disease    Cervical dysplasia    Claustrophobia    Complication of anesthesia    slow to awaken, doesn't want Propofol- when she had it before, had to be reminded to breath.   Depression    Family history of adverse reaction to anesthesia    mother difficulty intubation   GERD (gastroesophageal reflux disease)    Hearing loss    Hypothyroidism    Multiple sclerosis (HCC)    PONV (postoperative nausea and vomiting)    Sleep apnea    C-Pap   Vision abnormalities     PAST SURGICAL HISTORY: Past Surgical History:  Procedure Laterality Date   25 GAUGE PARS PLANA VITRECTOMY WITH 20 GAUGE MVR PORT FOR MACULAR HOLE Right 05/06/2018   25 GAUGE PARS PLANA VITRECTOMY WITH 20 GAUGE MVR PORT FOR MACULAR HOLE Right 05/06/2018   Procedure: 25 GAUGE PARS PLANA VITRECTOMY WITH 20 GAUGE MVR PORT FOR MACULAR HOLE;  Surgeon: Hayden Pedro, MD;  Location: [place];  Service: Ophthalmology;  Laterality: Right;   ABDOMINAL HYSTERECTOMY  1999   TAH,BSO menorrhgia,,endometriosis   CHOLECYSTECTOMY  1983   COLONOSCOPY     COLPOSCOPY     FINGER SURGERY Right    5 finger    GAS/FLUID EXCHANGE Right 05/06/2018   Procedure: GAS/FLUID EXCHANGE C3F8;  Surgeon: Hayden Pedro, MD;  Location: [place];  Service: Ophthalmology;  Laterality: Right;   [place]   MEMBRANE PEEL Right 05/06/2018   Procedure: MEMBRANE PEEL;  Surgeon: Hayden Pedro, MD;  Location: [place];  Service: Ophthalmology;  Laterality: Right;   PHOTOCOAGULATION WITH LASER Right 05/06/2018   Procedure: PHOTOCOAGULATION WITH LASER;  Surgeon: Hayden Pedro, MD;  Location: [place];  Service: Ophthalmology;  Laterality: Right;   ROTATOR CUFF REPAIR Right 2015  TOTAL HIP ARTHROPLASTY Right 02/06/2022    Procedure: RIGHT TOTAL HIP ARTHROPLASTY ANTERIOR APPROACH;  Surgeon: Melrose Nakayama, MD;  Location: [facility];  Service: Orthopedics;  Laterality: Right;   VITRECTOMY      FAMILY HISTORY: Family History  Problem Relation Age of Onset   Breast cancer Mother        age 75's   Cancer Mother        UTERINE   Colon cancer Mother    Diabetes Father    Hypertension Father    Heart disease Father    Cancer Maternal Grandfather        Stomach cancer   Heart disease Paternal Grandfather     SOCIAL HISTORY:  Social History   Socioeconomic History   Marital status: Married    Spouse name: Not on file   Number of children: Not on file   Years of education: Not on file   Highest education level: Not on file  Occupational History   Not on file  Tobacco Use   Smoking status: Former    Packs/day: 0.50    Years: 15.00    Total pack years: 7.50    Types: Cigarettes    Quit date: 2008    Years since quitting: 15.5   Smokeless tobacco: Never   Tobacco comments:    05/05/18- quit at least 15 years ago  Vaping Use   Vaping Use: Never used  Substance and Sexual Activity   Alcohol use: No   Drug use: No   Sexual activity: Not Currently    Birth control/protection: Surgical  Other Topics Concern   Not on file  Social History Narrative   Not on file   Social Determinants of Health   Financial Resource Strain: Not on file  Food Insecurity: Not on file  Transportation Needs: Not on file  Physical Activity: Not on file  Stress: Not on file  Social Connections: Not on file  Intimate Partner Violence: Not on file     PHYSICAL EXAM  Vitals:   03/15/22 1421  BP: (!) 162/88  Pulse: 79  Weight: 168 lb (76.2 kg)  Height: 5' 3"  (1.6 m)    Body mass index is 29.76 kg/m.   General: The patient is well-developed and well-nourished and in no acute distress  Neck:.  The neck is nontender with good ROM   Neurologic Exam  Mental status: The patient is alert and oriented x 3 at  the time of the examination. The patient has apparent normal recent and remote memory, with an apparently normal attention span and concentration ability.   Speech is normal.  Cranial nerves: Extraocular movements are full.  Facial strength and sensation is normal. Trapezius strength is normal.. No dysarthria is noted. Mildly reduced hearing on the left.    Motor: Muscle bulk is normal.  Muscle tone is increased.  Strength is 5/5.  Sensory: She has intact touch and vibration sensation in the arms and legs.  Coordination: Finger-nose-finger and heel-to-shin is performed well..  Gait and station: The station is normal.  Gait is mildly wide and slight right foot drop.    The tandem gait is wide.  Romberg is negative.  Reflexes: Deep tendon reflexes are symmetric and normal bilaterally.      DIAGNOSTIC DATA (LABS, IMAGING, TESTING) - I reviewed patient records, labs, notes, testing and imaging myself where available.  Lab Results  Component Value Date   WBC 4.6 01/31/2022   HGB 13.6 01/31/2022   HCT 41.2  01/31/2022   MCV 86.2 01/31/2022   PLT 310 01/31/2022      Component Value Date/Time   NA 139 01/31/2022 1342   NA 135 08/24/2019 0950   K 3.2 (L) 01/31/2022 1342   CL 103 01/31/2022 1342   CO2 29 01/31/2022 1342   GLUCOSE 87 01/31/2022 1342   BUN 14 01/31/2022 1342   BUN 11 08/24/2019 0950   CREATININE 0.68 01/31/2022 1342   CALCIUM 9.1 01/31/2022 1342   PROT 6.8 09/06/2021 1339   ALBUMIN 4.6 09/06/2021 1339   AST 16 09/06/2021 1339   ALT 19 09/06/2021 1339   ALKPHOS 86 09/06/2021 1339   BILITOT 0.4 09/06/2021 1339   GFRNONAA >60 01/31/2022 1342   GFRAA 83 08/24/2019 0950        ASSESSMENT AND PLAN  Multiple sclerosis (HCC)  OSA (obstructive sleep apnea)  Meniere's disease, unspecified laterality  Vitamin D deficiency  Insomnia, unspecified type  Ataxic gait   1.   Continue Aubagio for MS. lab work has been fine. 2.   Continue medications for  spasticity (valium), insomnia (Ambien), mood , Meniers (Dyazide).   Increase K to 2 pills a day.  I advised her to increase the potassium supplements to twice a day instead of once a day as potassium has been low.  This is likely due to to the Dyazide for Mnire's disease blood pressure is mildly elevated and I have advised her to discuss this further at her next primary care appointment if it persists 3.    Use CPAP nightly    Since Ambien script just sent to mail order will send a small supply to local pharmacy.   4.     stay active and exercise as tolerated. 5.     Return in 6 months or sooner if there are new or worsening    Jaeleen Inzunza A. Felecia Shelling, MD, PhD 02/07/3085, 5:78 PM Certified in Neurology, Clinical Neurophysiology, Sleep Medicine, Pain Medicine and Neuroimaging  [facility] [address] [place], [place] [address] [phone]

## 2022-03-19 DIAGNOSIS — I10 Essential (primary) hypertension: Secondary | ICD-10-CM | POA: Diagnosis not present

## 2022-03-19 DIAGNOSIS — G35 Multiple sclerosis: Secondary | ICD-10-CM | POA: Diagnosis not present

## 2022-04-16 DIAGNOSIS — G35 Multiple sclerosis: Secondary | ICD-10-CM | POA: Diagnosis not present

## 2022-04-16 DIAGNOSIS — E663 Overweight: Secondary | ICD-10-CM | POA: Diagnosis not present

## 2022-04-16 DIAGNOSIS — I10 Essential (primary) hypertension: Secondary | ICD-10-CM | POA: Diagnosis not present

## 2022-05-16 DIAGNOSIS — G4733 Obstructive sleep apnea (adult) (pediatric): Secondary | ICD-10-CM | POA: Diagnosis not present

## 2022-05-30 ENCOUNTER — Telehealth: Payer: Self-pay | Admitting: Family Medicine

## 2022-05-30 MED ORDER — DIAZEPAM 5 MG PO TABS: ORAL_TABLET | ORAL | 0 refills | Status: DC

## 2022-05-30 MED ORDER — ZOLPIDEM TARTRATE ER 12.5 MG PO TBCR: 12.5000 mg | EXTENDED_RELEASE_TABLET | Freq: Every evening | ORAL | 1 refills | Status: DC | PRN

## 2022-05-30 NOTE — Telephone Encounter (Signed)
Pt is requesting refill on medication  zolpidem (AMBIEN CR) 12.5 MG CR tablet and triamterene-hydrochlorothiazide (DYAZIDE) 37.5-25 MG capsule and Teriflunomide (AUBAGIO) 14 MG TABS and diazepam (VALIUM) 5 MG tablet. Refills should be sent to [facility]

## 2022-05-30 NOTE — Telephone Encounter (Signed)
Reviewed pt chart. Dyazide and Aubagio already e-scribed 02/26/22 #90 and 3 refills to [facility]. This should be on file at pharmacy.   Per drug registry, last refilled ambien 03/08/22 #90 and valium 03/08/22 #90. Rx printed, waiting on NP signature and then will fax.

## 2022-05-30 NOTE — Telephone Encounter (Signed)
Faxed rx zolpidem/diazepam to [facility] at [phone]. Received fax confirmation.

## 2022-06-02 DIAGNOSIS — M25551 Pain in right hip: Secondary | ICD-10-CM | POA: Diagnosis not present

## 2022-06-07 DIAGNOSIS — Z1231 Encounter for screening mammogram for malignant neoplasm of breast: Secondary | ICD-10-CM | POA: Diagnosis not present

## 2022-06-14 ENCOUNTER — Encounter (INDEPENDENT_AMBULATORY_CARE_PROVIDER_SITE_OTHER): Payer: Medicare Other | Admitting: Ophthalmology

## 2022-07-16 ENCOUNTER — Encounter (INDEPENDENT_AMBULATORY_CARE_PROVIDER_SITE_OTHER): Payer: Medicare Other | Admitting: Ophthalmology

## 2022-07-31 ENCOUNTER — Telehealth: Payer: Self-pay | Admitting: Neurology

## 2022-07-31 MED ORDER — DIAZEPAM 5 MG PO TABS: ORAL_TABLET | ORAL | 1 refills | Status: DC

## 2022-07-31 NOTE — Telephone Encounter (Signed)
Pt last seen 03/15/22 and next f/u 09/20/22. Per drug registry, last refilled 06/07/22 #90. Rx printed, pending MD signature and will then fax.

## 2022-07-31 NOTE — Telephone Encounter (Signed)
Faxed printed/signed rx to [facility] at [phone]. Received fax confirmation.

## 2022-07-31 NOTE — Telephone Encounter (Signed)
Pt is needing her diazepam (VALIUM) 5 MG tablet refill request sent to the [facility] mail service

## 2022-08-27 ENCOUNTER — Encounter (INDEPENDENT_AMBULATORY_CARE_PROVIDER_SITE_OTHER): Admitting: Ophthalmology

## 2022-08-27 DIAGNOSIS — H35341 Macular cyst, hole, or pseudohole, right eye: Secondary | ICD-10-CM | POA: Diagnosis not present

## 2022-08-27 DIAGNOSIS — H43813 Vitreous degeneration, bilateral: Secondary | ICD-10-CM

## 2022-09-11 DIAGNOSIS — J0100 Acute maxillary sinusitis, unspecified: Secondary | ICD-10-CM | POA: Diagnosis not present

## 2022-09-11 DIAGNOSIS — G35 Multiple sclerosis: Secondary | ICD-10-CM | POA: Diagnosis not present

## 2022-09-11 DIAGNOSIS — R051 Acute cough: Secondary | ICD-10-CM | POA: Diagnosis not present

## 2022-09-19 NOTE — Progress Notes (Unsigned)
No chief complaint on file.    HISTORY OF PRESENT ILLNESS:  09/19/22 ALL:  Leslie Ryan is a 72 y.o. female here today for follow up for RRMS. She continues Aubagio and tolerating well. MRI brain stable 02/2020, cervical imaging showed possibility of 2 new lesions adjacent to C2-C3 and C6. Labs have been stable.   She feels that she is doing well with her MS. She denies changes in her gait or balance, changes in bowel or bladder, or vision. She denies any new or worsening muscle weakness.She remains on Diazepam 5-'10mg'$  daily as needed for spasms. 90 tablets, last filled in . She continues to have occasional foot drop on the left loot with prolonged ambulation.   She remains on Dyazide 37.5-25 MG capsule for the Mnire's. Potassium has been low and Dr Felecia Shelling advised she increase to '40mg'$  of potassium. Mood is stable on fluoxetine '20mg'$  daily.   She is sleeping well. Ambien CR 12.'5mg'$  daily at bedtime helps initiate sleep without feeling poorly the next morning.    She continues vitamin D 2000iu daily for vitamin deficiency.  CPAP: Her compliance report below demonstrates good compliance of 97% (29/30 days), >4 hours is 67% of the time. Her AHI is 3.3 on a pressure setting of 10 cmH2O.She endorses that she is unable to tolerate the face mask and has tried nasal pillows and other face mask. She is interested in the inspire device. ESS 0. Patient has lost 20 lbs and is planning to lose 20 more. Encouraged patient to consider completing a new sleep study after losing more weight since this may significantly help with her OSA.   HISTORY (copied from Dr Garth Bigness previous note)  Leslie Ryan is a 72 y.o. woman with relapsing remitting multiple sclerosis.     Update 03/15/2022: She is on Aubagio for her RRMS and tolerates it well.   Multiple LFT's [facility] 2 years are normal.  She denies exacerbation or new neurologic symptoms.   She has had some fluctuations in gait but no fall.      She holds  the bannister with stairs.    Her right leg is slightly weaker than left and she has a foot drop if more tired.   Vision is doing well.     Bladder is the same with urgency and frequency.     MRIs in 2021 showed no new brain or cervical spine lesions.   However, MRI in 2014 showed new foci.     She has Meniere's, much better with a daily Dyazide daily (and valium if spell). She just has mild vertigo at times now.  She also sees D. Rosen.  She states she was told she had fluid behind her ear.   She has had low potassium off/on with numbers 3.0-3.4 lately.   She is on Ambien XR 12.5 for insomnia with benefit for sleep onset > maintenance    She has no hangover on Ambien but trazodone and other medications caused that. .    She is on CPAP +10 cm for OSA.     D/L shows a 30 day AHI 1.9 and compliance is 93% (100% use but 9/30 nights < 4 hours).       She is going to restart water aerobics a [facility].    She has lumbar DJD.     She had right hip replacement since last visit and feels gait is slightly worse   EPWORTH SLEEPINESS SCALE   On a  scale of 0 - 3 what is the chance of dozing:   Sitting and Reading:                           0 Watching TV:                                      1 Sitting inactive in a public place:        0 Passenger in car for one hour:           0 Lying down to rest in the afternoon:   1 Sitting and talking to someone:          0 Sitting quietly after lunch:                   0 In a car, stopped in traffic:                  0   Total (out of 24):   Normal 2/24     MS History:  In the mid 1980s, she presented with right optic neuritis while working at [facility]. A lumbar puncture was inconclusive but an MRI of the brain was consistent with MS a few years later. In the 1980s and early 1990s, she received multiple rounds of IV steroids for occasional exacerbations involving vision or gait. In 1994, she started Betaseron stopped quickly due to severe  injection site reactions. In January 1997, after an exacerbation she was started on Avonex and continued on Avonex for the next 15 years or so. She had some issues with compliance on some occasions.   In the late 1990s, she began to have more permanent gait issues the right foot drop that slowly worsened. I saw early MRI reports from 2000 showing 2 foci at C6 and C7 on the cervical spine and predominantly periventricular foci on brain MRI.   On 02/20/2013, she had the sudden onset of vertigo that was not altered by movements. Her gait worsened with ataxia and she felt like she was being pushed to the right as she walked. She did not note any numbness or weakness or visual changes. An MRI of the brain on 02/25/2013 showed multiple periventricular white matter lesions consistent with MS. She received 3 days of IV steroids with benefit. I personally reviewed the MRI and felt that there were 3 new lesions in the left frontal parietal out of that were not present on her prior MRI dated 08/08/2010 the foci enhanced. Due to the exacerbation and the MRI changes, she switched to Aubagio in 2014. She tolerates it well and liver function tests have been fine.   IMAGING: MRI brain 03/06/2020 shows T2 hyperintense foci in the hemispheres and some foci in the pons. Many of the lesions are in the periventricular white matter and some in the juxtacortical white matter, consistent with chronic demyelinating plaque associated with multiple sclerosis. There could be mild superimposed chronic microvascular ischemic change. None of the foci appear to be acute. Compared to the MRI from 06/20/2014, there is no definite changes.   MRI cervical spine 03/06/2020 shows T2 hyperintense focus in the left adjacent to T1.  This is consistent with chronic demyelinating plaque associated with multiple sclerosis.  On the sagittal images, but not clearly seen on axial images, there could be 2 additional small foci adjacent to  C2-C3 and C6.       Minimal to mild spinal stenosis at C4-C5 through C6-C7 due to disc bulging, and ligamenta flava hypertrophy.  There is no nerve root compression.   REVIEW OF SYSTEMS: Out of a complete 14 system review of symptoms, the patient complains only of the following symptoms, and all other reviewed systems are negative.   ALLERGIES: Allergies  Allergen Reactions   Erythromycin Other (See Comments)    Abdominal cramping   Flexeril [Cyclobenzaprine]     Severe constipation   Propofol Other (See Comments)    Has trouble waking up   Morphine And Related     Patient described heaviness and loss of feeling in legs coupled with a squeezing feeling in the epigastric region. Requested no further Morphine doses.        HOME MEDICATIONS: Outpatient Medications Prior to Visit  Medication Sig Dispense Refill   aspirin 81 MG tablet Take 1 tablet (81 mg total) by mouth 2 (two) times daily. For 2 weeks then back to once a day after that for DVT prevention. 45 tablet 0   Cholecalciferol (VITAMIN D-3) 1000 units CAPS Take 2,000 tablets by mouth daily.     diazepam (VALIUM) 5 MG tablet One or two po daily as needed 90 tablet 1   FLUoxetine (PROZAC) 20 MG capsule Take 1 capsule (20 mg total) by mouth daily. 90 capsule 4   lansoprazole (PREVACID) 30 MG capsule Take 30 mg by mouth daily.     levothyroxine (SYNTHROID, LEVOTHROID) 112 MCG tablet Take 112 mcg by mouth daily.     potassium chloride SA (K-DUR,KLOR-CON) 20 MEQ tablet Take 20 mEq by mouth 2 (two) times daily.     pravastatin (PRAVACHOL) 20 MG tablet Take 20 mg by mouth daily.     Probiotic Product (PROBIOTIC PO) Take 1 capsule by mouth daily.     Teriflunomide (AUBAGIO) 14 MG TABS Take 14 mg by mouth daily. 90 tablet 3   triamterene-hydrochlorothiazide (DYAZIDE) 37.5-25 MG capsule Take 1 each (1 capsule total) by mouth daily. 90 capsule 3   vitamin E 400 UNIT capsule Take 400 Units by mouth daily.     zolpidem (AMBIEN CR) 12.5 MG CR tablet Take 1  tablet (12.5 mg total) by mouth at bedtime as needed for sleep. 90 tablet 1   No facility-administered medications prior to visit.     PAST MEDICAL HISTORY: Past Medical History:  Diagnosis Date   Anxiety    Celiac disease    Cervical dysplasia    Claustrophobia    Complication of anesthesia    slow to awaken, doesn't want Propofol- when she had it before, had to be reminded to breath.   Depression    Family history of adverse reaction to anesthesia    mother difficulty intubation   GERD (gastroesophageal reflux disease)    Hearing loss    Hypothyroidism    Multiple sclerosis (HCC)    PONV (postoperative nausea and vomiting)    Sleep apnea    C-Pap   Vision abnormalities      PAST SURGICAL HISTORY: Past Surgical History:  Procedure Laterality Date   25 GAUGE PARS PLANA VITRECTOMY WITH 20 GAUGE MVR PORT FOR MACULAR HOLE Right 05/06/2018   25 GAUGE PARS PLANA VITRECTOMY WITH 20 GAUGE MVR PORT FOR MACULAR HOLE Right 05/06/2018   Procedure: 25 GAUGE PARS PLANA VITRECTOMY WITH 20 GAUGE MVR PORT FOR MACULAR HOLE;  Surgeon: Hayden Pedro, MD;  Location: [facility];  Service:  Ophthalmology;  Laterality: Right;   ABDOMINAL HYSTERECTOMY  1999   TAH,BSO menorrhgia,,endometriosis   CHOLECYSTECTOMY  1983   COLONOSCOPY     COLPOSCOPY     FINGER SURGERY Right    5 finger    GAS/FLUID EXCHANGE Right 05/06/2018   Procedure: GAS/FLUID EXCHANGE C3F8;  Surgeon: Hayden Pedro, MD;  Location: [facility];  Service: Ophthalmology;  Laterality: Right;   [place]   MEMBRANE PEEL Right 05/06/2018   Procedure: MEMBRANE PEEL;  Surgeon: Hayden Pedro, MD;  Location: [facility];  Service: Ophthalmology;  Laterality: Right;   PHOTOCOAGULATION WITH LASER Right 05/06/2018   Procedure: PHOTOCOAGULATION WITH LASER;  Surgeon: Hayden Pedro, MD;  Location: [facility];  Service: Ophthalmology;  Laterality: Right;   ROTATOR CUFF REPAIR Right 2015   TOTAL HIP ARTHROPLASTY  Right 02/06/2022   Procedure: RIGHT TOTAL HIP ARTHROPLASTY ANTERIOR APPROACH;  Surgeon: Melrose Nakayama, MD;  Location: [facility];  Service: Orthopedics;  Laterality: Right;   VITRECTOMY       FAMILY HISTORY: Family History  Problem Relation Age of Onset   Breast cancer Mother        age 52's   Cancer Mother        UTERINE   Colon cancer Mother    Diabetes Father    Hypertension Father    Heart disease Father    Cancer Maternal Grandfather        Stomach cancer   Heart disease Paternal Grandfather      SOCIAL HISTORY: Social History   Socioeconomic History   Marital status: Married    Spouse name: Not on file   Number of children: Not on file   Years of education: Not on file   Highest education level: Not on file  Occupational History   Not on file  Tobacco Use   Smoking status: Former    Packs/day: 0.50    Years: 15.00    Total pack years: 7.50    Types: Cigarettes    Quit date: 2008    Years since quitting: 16.0   Smokeless tobacco: Never   Tobacco comments:    05/05/18- quit at least 15 years ago  Vaping Use   Vaping Use: Never used  Substance and Sexual Activity   Alcohol use: No   Drug use: No   Sexual activity: Not Currently    Birth control/protection: Surgical  Other Topics Concern   Not on file  Social History Narrative   Not on file   Social Determinants of Health   Financial Resource Strain: Not on file  Food Insecurity: Not on file  Transportation Needs: Not on file  Physical Activity: Not on file  Stress: Not on file  Social Connections: Not on file  Intimate Partner Violence: Not on file     PHYSICAL EXAM  There were no vitals filed for this visit.  There is no height or weight on file to calculate BMI.  Generalized: Well developed, in no acute distress  Cardiology: normal rate and rhythm, no murmur auscultated  Respiratory: clear to auscultation bilaterally    Neurological examination  Mentation: Alert oriented to time,  place, history taking. Follows all commands speech and language fluent Cranial nerve II-XII: Pupils were equal round reactive to light. Extraocular movements were full, visual field were full on confrontational test. Facial sensation and strength were normal. Head turning and shoulder shrug  were normal and symmetric. Motor: The motor testing reveals 5  over 5 strength of bilateral upper and right lower extremities. 4 over 5 strength on the left lower extremity. Good symmetric motor tone is noted throughout.  Sensory: Sensory testing is intact to soft touch on all 4 extremities. No evidence of extinction is noted.  Coordination: Cerebellar testing reveals good finger-nose-finger and heel-to-shin bilaterally.  Gait and station: Gait is mildly wide, stable with no assistive device. Patient refuses tandem stating she can not do it.  Reflexes: Deep tendon reflexes are symmetric and normal bilaterally.    DIAGNOSTIC DATA (LABS, IMAGING, TESTING) - I reviewed patient records, labs, notes, testing and imaging myself where available.  Lab Results  Component Value Date   WBC 4.6 01/31/2022   HGB 13.6 01/31/2022   HCT 41.2 01/31/2022   MCV 86.2 01/31/2022   PLT 310 01/31/2022      Component Value Date/Time   NA 139 01/31/2022 1342   NA 135 08/24/2019 0950   K 3.2 (L) 01/31/2022 1342   CL 103 01/31/2022 1342   CO2 29 01/31/2022 1342   GLUCOSE 87 01/31/2022 1342   BUN 14 01/31/2022 1342   BUN 11 08/24/2019 0950   CREATININE 0.68 01/31/2022 1342   CALCIUM 9.1 01/31/2022 1342   PROT 6.8 09/06/2021 1339   ALBUMIN 4.6 09/06/2021 1339   AST 16 09/06/2021 1339   ALT 19 09/06/2021 1339   ALKPHOS 86 09/06/2021 1339   BILITOT 0.4 09/06/2021 1339   GFRNONAA >60 01/31/2022 1342   GFRAA 83 08/24/2019 0950   No results found for: "CHOL", "HDL", "LDLCALC", "LDLDIRECT", "TRIG", "CHOLHDL" No results found for: "HGBA1C" No results found for: "VITAMINB12" Lab Results  Component Value Date   TSH 0.042  (L) 06/21/2014        No data to display               No data to display           ASSESSMENT AND PLAN  72 y.o. year old female  has a past medical history of Anxiety, Celiac disease, Cervical dysplasia, Claustrophobia, Complication of anesthesia, Depression, Family history of adverse reaction to anesthesia, GERD (gastroesophageal reflux disease), Hearing loss, Hypothyroidism, Multiple sclerosis ([place]), PONV (postoperative nausea and vomiting), Sleep apnea, and Vision abnormalities. here with    No diagnosis found.   Patient is stable on current MS regimen, we will continue Aubagio 14 mg by mouth daily. Continue Triamterene-hydrochlorothiazide (DYAZIDE) 37.5-25 MG capsule for the Mnire's. Continue Ambien XR 12.5 for insomnia and Vitamin D-3 1000, 2 capsules by mouth. PDMP checked and appropriate. We will collect Vitamin D, CBC, and liver function today.  Encouraged patient to continue using the CPAP daily and for >4 hours. Continue with your weight loss and consider repeating a sleep study.  Follow up with Dr. Felecia Shelling in 6 months or sooner if needed.   Lovena Le NP-S assisted with note.   No orders of the defined types were placed in this encounter.    No orders of the defined types were placed in this encounter.     Debbora Presto, MSN, FNP-C 09/19/2022, 3:04 PM  [facility] [address] [place], [place] [address] [phone]

## 2022-09-19 NOTE — Patient Instructions (Signed)
Below is our plan:  We will ontinue current treatment plan. Let me know when you need refills.   Please make sure you are staying well hydrated. I recommend 50-60 ounces daily. Well balanced diet and regular exercise encouraged. Consistent sleep schedule with 6-8 hours recommended.   Please continue follow up with care team as directed.   Follow up with Dr Epimenio Foot in 6 months   You may receive a survey regarding today's visit. I encourage you to leave honest feed back as I do use this information to improve patient care. Thank you for seeing me today!

## 2022-09-20 ENCOUNTER — Encounter: Payer: Self-pay | Admitting: Family Medicine

## 2022-09-20 ENCOUNTER — Ambulatory Visit (INDEPENDENT_AMBULATORY_CARE_PROVIDER_SITE_OTHER): Payer: Medicare Other | Admitting: Family Medicine

## 2022-09-20 VITALS — BP 124/72 | HR 81 | Ht 63.0 in | Wt 177.5 lb

## 2022-09-20 DIAGNOSIS — G4700 Insomnia, unspecified: Secondary | ICD-10-CM

## 2022-09-20 DIAGNOSIS — Z79899 Other long term (current) drug therapy: Secondary | ICD-10-CM

## 2022-09-20 DIAGNOSIS — G35 Multiple sclerosis: Secondary | ICD-10-CM | POA: Diagnosis not present

## 2022-09-20 DIAGNOSIS — G4733 Obstructive sleep apnea (adult) (pediatric): Secondary | ICD-10-CM

## 2022-09-20 DIAGNOSIS — E559 Vitamin D deficiency, unspecified: Secondary | ICD-10-CM

## 2022-09-20 DIAGNOSIS — H8109 Meniere's disease, unspecified ear: Secondary | ICD-10-CM

## 2022-09-21 LAB — COMPREHENSIVE METABOLIC PANEL
ALT: 23 IU/L (ref 0–32)
AST: 17 IU/L (ref 0–40)
Albumin/Globulin Ratio: 1.8 (ref 1.2–2.2)
Albumin: 4.2 g/dL (ref 3.8–4.8)
Alkaline Phosphatase: 84 IU/L (ref 44–121)
BUN/Creatinine Ratio: 22 (ref 12–28)
BUN: 17 mg/dL (ref 8–27)
Bilirubin Total: 0.3 mg/dL (ref 0.0–1.2)
CO2: 23 mmol/L (ref 20–29)
Calcium: 9.7 mg/dL (ref 8.7–10.3)
Chloride: 95 mmol/L — ABNORMAL LOW (ref 96–106)
Creatinine, Ser: 0.76 mg/dL (ref 0.57–1.00)
Globulin, Total: 2.3 g/dL (ref 1.5–4.5)
Glucose: 84 mg/dL (ref 70–99)
Potassium: 5.1 mmol/L (ref 3.5–5.2)
Sodium: 133 mmol/L — ABNORMAL LOW (ref 134–144)
Total Protein: 6.5 g/dL (ref 6.0–8.5)
eGFR: 83 mL/min/{1.73_m2} (ref >59)

## 2022-09-21 LAB — CBC WITH DIFFERENTIAL/PLATELET
Basophils Absolute: 0 10*3/uL (ref 0.0–0.2)
Basos: 1 %
EOS (ABSOLUTE): 0.1 10*3/uL (ref 0.0–0.4)
Eos: 3 %
Hematocrit: 38.1 % (ref 34.0–46.6)
Hemoglobin: 12.8 g/dL (ref 11.1–15.9)
Immature Grans (Abs): 0 10*3/uL (ref 0.0–0.1)
Immature Granulocytes: 0 %
Lymphocytes Absolute: 1.1 10*3/uL (ref 0.7–3.1)
Lymphs: 24 %
MCH: 28.6 pg (ref 26.6–33.0)
MCHC: 33.6 g/dL (ref 31.5–35.7)
MCV: 85 fL (ref 79–97)
Monocytes Absolute: 0.5 10*3/uL (ref 0.1–0.9)
Monocytes: 10 %
Neutrophils Absolute: 2.9 10*3/uL (ref 1.4–7.0)
Neutrophils: 62 %
Platelets: 353 10*3/uL (ref 150–450)
RBC: 4.47 x10E6/uL (ref 3.77–5.28)
RDW: 12.4 % (ref 11.7–15.4)
WBC: 4.7 10*3/uL (ref 3.4–10.8)

## 2022-09-21 LAB — VITAMIN D 25 HYDROXY (VIT D DEFICIENCY, FRACTURES): Vit D, 25-Hydroxy: 45.1 ng/mL (ref 30.0–100.0)

## 2022-10-10 ENCOUNTER — Telehealth: Payer: Self-pay | Admitting: Family Medicine

## 2022-10-10 DIAGNOSIS — E663 Overweight: Secondary | ICD-10-CM | POA: Diagnosis not present

## 2022-10-10 DIAGNOSIS — E039 Hypothyroidism, unspecified: Secondary | ICD-10-CM | POA: Diagnosis not present

## 2022-10-10 DIAGNOSIS — I10 Essential (primary) hypertension: Secondary | ICD-10-CM | POA: Diagnosis not present

## 2022-10-10 DIAGNOSIS — R7301 Impaired fasting glucose: Secondary | ICD-10-CM | POA: Diagnosis not present

## 2022-10-10 NOTE — Telephone Encounter (Signed)
Noted  

## 2022-10-10 NOTE — Telephone Encounter (Signed)
Pt is requesting a refill for zolpidem (AMBIEN CR) 12.5 MG CR tablet t.  Pharmacy: [facility]

## 2022-10-10 NOTE — Telephone Encounter (Signed)
Called pt and informed her of message nurse Emma sent for her. She said I will call them right now and request a refill. Pt said thanks for calling.

## 2022-10-10 NOTE — Telephone Encounter (Signed)
Last seen 09/20/22 and next f/u 03/21/23. Per drug registry, last refilled 03/08/22 #90.  We last faxed a prescription for zolpidem to [facility] at [phone] back on 05/30/22 #90, 1 refill. They should have this prescription on file to refill for her. Please call pt to let her know

## 2022-10-16 DIAGNOSIS — R7301 Impaired fasting glucose: Secondary | ICD-10-CM | POA: Diagnosis not present

## 2022-10-16 DIAGNOSIS — I10 Essential (primary) hypertension: Secondary | ICD-10-CM | POA: Diagnosis not present

## 2022-10-16 DIAGNOSIS — E785 Hyperlipidemia, unspecified: Secondary | ICD-10-CM | POA: Diagnosis not present

## 2022-10-16 DIAGNOSIS — E663 Overweight: Secondary | ICD-10-CM | POA: Diagnosis not present

## 2022-12-25 DIAGNOSIS — K080 Exfoliation of teeth due to systemic causes: Secondary | ICD-10-CM | POA: Diagnosis not present

## 2023-01-02 ENCOUNTER — Other Ambulatory Visit: Payer: Self-pay | Admitting: Orthopaedic Surgery

## 2023-01-02 DIAGNOSIS — M25551 Pain in right hip: Secondary | ICD-10-CM

## 2023-01-08 ENCOUNTER — Telehealth: Payer: Self-pay | Admitting: Family Medicine

## 2023-01-08 MED ORDER — ZOLPIDEM TARTRATE ER 12.5 MG PO TBCR: 12.5000 mg | EXTENDED_RELEASE_TABLET | Freq: Every evening | ORAL | 1 refills | Status: DC | PRN

## 2023-01-08 NOTE — Telephone Encounter (Signed)
Pt called and requested a refill on her zolpidem (AMBIEN CR) 12.5 MG CR tablet and is needing it sent to the [facility]

## 2023-01-08 NOTE — Telephone Encounter (Signed)
Faxed rx to [facility] at [phone]. Received fax confirmation.

## 2023-01-08 NOTE — Telephone Encounter (Signed)
Pt last seen 09/20/22 and next f/u 03/21/23. Last refilled zolpidem 10/12/22 #90. Rx printed, pending MD signature and then will fax once signed.

## 2023-01-19 ENCOUNTER — Other Ambulatory Visit: Payer: Self-pay | Admitting: Neurology

## 2023-01-19 DIAGNOSIS — G35 Multiple sclerosis: Secondary | ICD-10-CM

## 2023-01-19 MED ORDER — TERIFLUNOMIDE 14 MG PO TABS: 14.0000 mg | ORAL_TABLET | Freq: Every day | ORAL | 0 refills | Status: DC

## 2023-01-19 MED ORDER — TERIFLUNOMIDE 14 MG PO TABS: 14.0000 mg | ORAL_TABLET | Freq: Every day | ORAL | 3 refills | Status: DC

## 2023-01-19 NOTE — Progress Notes (Signed)
Patient called and stated that she ran out of Aubagio for the past 3 days. Refill sent to her [facility] pharmacy but since it is the weekend and this is mail pharmary. Will send 14 tablets to her local [facility] pharmacy.   Dr. Teresa Coombs

## 2023-01-21 ENCOUNTER — Other Ambulatory Visit: Payer: Self-pay

## 2023-01-21 ENCOUNTER — Telehealth: Payer: Self-pay | Admitting: Family Medicine

## 2023-01-21 ENCOUNTER — Other Ambulatory Visit: Payer: Self-pay | Admitting: Neurology

## 2023-01-21 DIAGNOSIS — G35 Multiple sclerosis: Secondary | ICD-10-CM

## 2023-01-21 MED ORDER — TERIFLUNOMIDE 14 MG PO TABS: 14.0000 mg | ORAL_TABLET | Freq: Every day | ORAL | 0 refills | Status: DC

## 2023-01-21 NOTE — Telephone Encounter (Signed)
Pt Last Seen 09/20/2022 Upcoming Appointment 03/21/2023

## 2023-01-21 NOTE — Telephone Encounter (Signed)
Pt called back and would like to speak to RN when available.

## 2023-01-21 NOTE — Telephone Encounter (Signed)
Pt stated she needs to talk to nurse about Teriflunomide (AUBAGIO) 14 MG TABS. She is asking if something can be call in to [facility]. [facility] will not send refill for a week or so.

## 2023-01-21 NOTE — Telephone Encounter (Signed)
Called pt back and she stated that the [facility] won't be able to send her the Lester Starke she has been taking for another 14 days. Pt states that she took her last of her medication 6 days ago and is afraid that she is going to relapse. Pt states she is going out of town Thursday and needs her script. Pt states that [facility] is too expensive as well as [facility]. Pt is asking if Dr. Epimenio Foot knows any pharmacies that she can use to get her script. Told pt that Dr. Epimenio Foot will be notified about this manner and we will reach back out to her. Pt verbalized understanding.

## 2023-01-22 NOTE — Telephone Encounter (Signed)
Please see MyChart message from today 01/22/2023

## 2023-01-22 NOTE — Telephone Encounter (Signed)
Called pt to make sure she had read the message we sent about her medication, pt stated that she had gotten our message and she was at [facility] getting her prescription. Told pt if she has any trouble to please call us back and we will send her a GoodRX coupon to her MyChart. Pt verbalized understanding.

## 2023-02-04 DIAGNOSIS — R7301 Impaired fasting glucose: Secondary | ICD-10-CM | POA: Diagnosis not present

## 2023-03-21 ENCOUNTER — Ambulatory Visit: Admitting: Neurology

## 2023-03-26 DIAGNOSIS — H9113 Presbycusis, bilateral: Secondary | ICD-10-CM | POA: Insufficient documentation

## 2023-03-26 DIAGNOSIS — H8109 Meniere's disease, unspecified ear: Secondary | ICD-10-CM | POA: Diagnosis not present

## 2023-03-31 DIAGNOSIS — G4733 Obstructive sleep apnea (adult) (pediatric): Secondary | ICD-10-CM | POA: Diagnosis not present

## 2023-04-08 ENCOUNTER — Ambulatory Visit (INDEPENDENT_AMBULATORY_CARE_PROVIDER_SITE_OTHER): Payer: Medicare Other | Admitting: Neurology

## 2023-04-08 ENCOUNTER — Encounter: Payer: Self-pay | Admitting: Neurology

## 2023-04-08 VITALS — BP 117/72 | HR 67 | Ht 62.0 in | Wt 168.0 lb

## 2023-04-08 DIAGNOSIS — Z79899 Other long term (current) drug therapy: Secondary | ICD-10-CM

## 2023-04-08 DIAGNOSIS — E559 Vitamin D deficiency, unspecified: Secondary | ICD-10-CM

## 2023-04-08 DIAGNOSIS — H8109 Meniere's disease, unspecified ear: Secondary | ICD-10-CM | POA: Diagnosis not present

## 2023-04-08 DIAGNOSIS — G35 Multiple sclerosis: Secondary | ICD-10-CM

## 2023-04-08 DIAGNOSIS — G4733 Obstructive sleep apnea (adult) (pediatric): Secondary | ICD-10-CM

## 2023-04-08 DIAGNOSIS — G4700 Insomnia, unspecified: Secondary | ICD-10-CM

## 2023-04-08 MED ORDER — TERIFLUNOMIDE 14 MG PO TABS: 14.0000 mg | ORAL_TABLET | Freq: Every day | ORAL | 3 refills | Status: DC

## 2023-04-08 MED ORDER — TRIAMTERENE-HCTZ 37.5-25 MG PO CAPS
1.0000 | ORAL_CAPSULE | Freq: Every day | ORAL | 3 refills | Status: DC
Start: 2023-04-08 — End: 2024-06-03

## 2023-04-08 MED ORDER — FLUOXETINE HCL 20 MG PO CAPS
20.0000 mg | ORAL_CAPSULE | Freq: Every day | ORAL | 4 refills | Status: AC
Start: 2023-04-08 — End: ?

## 2023-04-08 NOTE — Progress Notes (Signed)
[facility]  PATIENT: Leslie Ryan DOB: April 17, 1951  REFERRING CLINICIAN: Geoffry Paradise HISTORY FROM: patient   REASON FOR VISIT: MS   HISTORICAL  CHIEF COMPLAINT:  Chief Complaint  Patient presents with   Follow-up    Rm 11. Patient reports husband passing less than a month ago, so she has been under stress, so she has had some symptoms. But nothing new she reports. Refills needed on [place], valium, dyazide    HISTORY OF PRESENT ILLNESS:  Leslie Ryan is a 72 y.o. woman with relapsing remitting multiple sclerosis.    Update 04-23-23: Her husband died a few weeks ago and she is more depressed, sleeping poorly and much more fatigued.  She feels more stresses He had CAD and vascular deentia and had an MI last month.  She is on Aubagio for her RRMS and tolerates it well.   She tolerates it well..  She denies exacerbation or new neurologic symptoms.   She drags the right foot at times but no falls.      She holds the bannister with stairs.   Foot drop is worse when tired.     Vision is doing worse and she will make an appt to see ophth soon.  She sees ENT (Dr. Pollyann Kennedy) for Meniere's and has a visit tomorrow.   She has Meniere's, much better with a daily Dyazide daily (and valium if spell). She just has mild vertigo at times now.     Bladder is the same with urgency and frequency.    MRIs in 2021 showed no new brain or cervical spine lesions.   However, MRI in 2014 showed new foci.    She is on Ambien XR 12.5 for insomnia with benefit for sleep onset > maintenance    She has no hangover on Ambien but trazodone and other medications caused that. .   She is on CPAP +10 cm for OSA.     D/L today shows a 30 day AHI 3.7 and compliance is 93%    She is going to restart water aerobics a [facility].    She has lumbar DJD.     She had right hip replacement since last visit and feels gait is slightly worse  EPWORTH SLEEPINESS SCALE  On a  scale of 0 - 3 what is the chance of dozing:  Sitting and Reading:   0 Watching TV:    0 Sitting inactive in a public place: 0 Passenger in car for one hour: 0 Lying down to rest in the afternoon: 1 Sitting and talking to someone: 0 Sitting quietly after lunch:  0 In a car, stopped in traffic:  0  Total (out of 24):   Normal 1/24   MS History:  In the mid 1980s, she presented with right optic neuritis while working at [facility] [facility]. A lumbar puncture was inconclusive but an MRI of the brain was consistent with MS a few years later. In the 1980s and early 1990s, she received multiple rounds of IV steroids for occasional exacerbations involving vision or gait. In 1994, she started Betaseron stopped quickly due to severe injection site reactions. In January 1997, after an exacerbation she was started on Avonex and continued on Avonex for the next 15 years or so. She had some issues with compliance on some occasions.   In the late 1990s, she began to have more permanent gait issues the right foot drop that slowly worsened. I saw early MRI reports from  2000 showing 2 foci at C6 and C7 on the cervical spine and predominantly periventricular foci on brain MRI.   On 02/20/2013, she had the sudden onset of vertigo that was not altered by movements. Her gait worsened with ataxia and she felt like she was being pushed to the right as she walked. She did not note any numbness or weakness or visual changes. An MRI of the brain on 02/25/2013 showed multiple periventricular white matter lesions consistent with MS. She received 3 days of IV steroids with benefit. I personally reviewed the MRI and felt that there were 3 new lesions in the left frontal parietal out of that were not present on her prior MRI dated 08/08/2010 the foci enhanced. Due to the exacerbation and the MRI changes, she switched to Aubagio in 2014. She tolerates it well and liver function tests have been fine.  IMAGING: MRI brain 03/06/2020  shows T2 hyperintense foci in the hemispheres and some foci in the pons. Many of the lesions are in the periventricular white matter and some in the juxtacortical white matter, consistent with chronic demyelinating plaque associated with multiple sclerosis. There could be mild superimposed chronic microvascular ischemic change. None of the foci appear to be acute. Compared to the MRI from 06/20/2014, there is no definite changes.  MRI cervical spine 03/06/2020 shows T2 hyperintense focus in the left adjacent to T1.  This is consistent with chronic demyelinating plaque associated with multiple sclerosis.  On the sagittal images, but not clearly seen on axial images, there could be 2 additional small foci adjacent to C2-C3 and C6.      Minimal to mild spinal stenosis at C4-C5 through C6-C7 due to disc bulging, and ligamenta flava hypertrophy.  There is no nerve root compression.  REVIEW OF SYSTEMS:  Constitutional: No fevers, chills, sweats, or change in appetite Eyes: No visual changes, double vision, eye pain Ear, nose and throat: No hearing loss, ear pain, nasal congestion, sore throat Cardiovascular: No chest pain, palpitations Respiratory:  No shortness of breath at rest or with exertion.   No wheezes GastrointestinaI: No nausea, vomiting, diarrhea, abdominal pain, fecal incontinence Genitourinary:  No dysuria, urinary retention or frequency.  No nocturia. Musculoskeletal:  No neck pain, back pain Integumentary: No rash, pruritus, skin lesions Neurological: as above Psychiatric: No depression at this time.  No anxiety Endocrine: No palpitations, diaphoresis, change in appetite, change in weigh or increased thirst.   She has felt cold since thyroid med's changed Hematologic/Lymphatic:  No anemia, purpura, petechiae. Allergic/Immunologic: No itchy/runny eyes, nasal congestion, recent allergic reactions, rashes  ALLERGIES: Allergies  Allergen Reactions   Erythromycin Other (See Comments)     Abdominal cramping   Flexeril [Cyclobenzaprine]     Severe constipation   Propofol Other (See Comments)    Has trouble waking up   Morphine And Codeine     Patient described heaviness and loss of feeling in legs coupled with a squeezing feeling in the epigastric region. Requested no further Morphine doses.       HOME MEDICATIONS: Outpatient Medications Prior to Visit  Medication Sig Dispense Refill   Ascorbic Acid (VITAMIN C PO) Take by mouth.     aspirin 81 MG tablet Take 1 tablet (81 mg total) by mouth 2 (two) times daily. For 2 weeks then back to once a day after that for DVT prevention. 45 tablet 0   Cholecalciferol (VITAMIN D-3) 1000 units CAPS Take 2,000 tablets by mouth daily.     diazepam (VALIUM) 5  MG tablet One or two po daily as needed 90 tablet 1   ELDERBERRY PO Take by mouth.     FLUoxetine (PROZAC) 20 MG capsule Take 1 capsule (20 mg total) by mouth daily. 90 capsule 4   lansoprazole (PREVACID) 30 MG capsule Take 30 mg by mouth daily.     levothyroxine (SYNTHROID, LEVOTHROID) 112 MCG tablet Take 112 mcg by mouth daily.     Multiple Vitamins-Minerals (ZINC PO) Take by mouth.     olmesartan (BENICAR) 20 MG tablet Take 1 tablet by mouth daily.     potassium chloride SA (K-DUR,KLOR-CON) 20 MEQ tablet Take 20 mEq by mouth 2 (two) times daily.     pravastatin (PRAVACHOL) 20 MG tablet Take 20 mg by mouth daily.     Probiotic Product (PROBIOTIC PO) Take 1 capsule by mouth daily.     triamterene-hydrochlorothiazide (DYAZIDE) 37.5-25 MG capsule Take 1 each (1 capsule total) by mouth daily. 90 capsule 3   vitamin E 400 UNIT capsule Take 400 Units by mouth daily.     zolpidem (AMBIEN CR) 12.5 MG CR tablet Take 1 tablet (12.5 mg total) by mouth at bedtime as needed for sleep. 90 tablet 1   Teriflunomide (AUBAGIO) 14 MG TABS Take 1 tablet (14 mg total) by mouth daily for 14 days. 14 tablet 0   No facility-administered medications prior to visit.    PAST MEDICAL HISTORY: Past Medical  History:  Diagnosis Date   Anxiety    Celiac disease    Cervical dysplasia    Claustrophobia    Complication of anesthesia    slow to awaken, doesn't want Propofol- when she had it before, had to be reminded to breath.   Depression    Family history of adverse reaction to anesthesia    mother difficulty intubation   GERD (gastroesophageal reflux disease)    Hearing loss    Hypothyroidism    Multiple sclerosis (HCC)    PONV (postoperative nausea and vomiting)    Sleep apnea    C-Pap   Vision abnormalities     PAST SURGICAL HISTORY: Past Surgical History:  Procedure Laterality Date   25 GAUGE PARS PLANA VITRECTOMY WITH 20 GAUGE MVR PORT FOR MACULAR HOLE Right 05/06/2018   25 GAUGE PARS PLANA VITRECTOMY WITH 20 GAUGE MVR PORT FOR MACULAR HOLE Right 05/06/2018   Procedure: 25 GAUGE PARS PLANA VITRECTOMY WITH 20 GAUGE MVR PORT FOR MACULAR HOLE;  Surgeon: Sherrie George, MD;  Location: [facility] OR;  Service: Ophthalmology;  Laterality: Right;   ABDOMINAL HYSTERECTOMY  1999   TAH,BSO menorrhgia,,endometriosis   CHOLECYSTECTOMY  1983   COLONOSCOPY     COLPOSCOPY     FINGER SURGERY Right    5 finger    GAS/FLUID EXCHANGE Right 05/06/2018   Procedure: GAS/FLUID EXCHANGE C3F8;  Surgeon: Sherrie George, MD;  Location: [facility] OR;  Service: Ophthalmology;  Laterality: Right;   GYNECOLOGIC CRYOSURGERY  1987   LAPAROSCOPY  1986   MEMBRANE PEEL Right 05/06/2018   Procedure: MEMBRANE PEEL;  Surgeon: Sherrie George, MD;  Location: [facility] OR;  Service: Ophthalmology;  Laterality: Right;   PHOTOCOAGULATION WITH LASER Right 05/06/2018   Procedure: PHOTOCOAGULATION WITH LASER;  Surgeon: Sherrie George, MD;  Location: [facility] OR;  Service: Ophthalmology;  Laterality: Right;   ROTATOR CUFF REPAIR Right 2015   TOTAL HIP ARTHROPLASTY Right 02/06/2022   Procedure: RIGHT TOTAL HIP ARTHROPLASTY ANTERIOR APPROACH;  Surgeon: Marcene Corning, MD;  Location: [facility] ORS;  Service: Orthopedics;  Laterality: Right;    VITRECTOMY      FAMILY HISTORY: Family History  Problem Relation Age of Onset   Breast cancer Mother        age 35's   Cancer Mother        UTERINE   Colon cancer Mother    Diabetes Father    Hypertension Father    Heart disease Father    Cancer Maternal Grandfather        Stomach cancer   Heart disease Paternal Grandfather     SOCIAL HISTORY:  Social History   Socioeconomic History   Marital status: Married    Spouse name: Not on file   Number of children: Not on file   Years of education: Not on file   Highest education level: Not on file  Occupational History   Not on file  Tobacco Use   Smoking status: Former    Current packs/day: 0.00    Average packs/day: 0.5 packs/day for 15.0 years (7.5 ttl pk-yrs)    Types: Cigarettes    Start date: 62    Quit date: 2008    Years since quitting: 16.6   Smokeless tobacco: Never   Tobacco comments:    05/05/18- quit at least 15 years ago  Vaping Use   Vaping status: Never Used  Substance and Sexual Activity   Alcohol use: No   Drug use: No   Sexual activity: Not Currently    Birth control/protection: Surgical  Other Topics Concern   Not on file  Social History Narrative   Not on file   Social Determinants of Health   Financial Resource Strain: Not on file  Food Insecurity: Not on file  Transportation Needs: Not on file  Physical Activity: Not on file  Stress: Not on file  Social Connections: Not on file  Intimate Partner Violence: Not on file     PHYSICAL EXAM  Vitals:   04/08/23 1408  BP: 117/72  Pulse: 67  Weight: 168 lb (76.2 kg)  Height: 5\' 2"  (1.575 m)    Body mass index is 30.73 kg/m.   General: The patient is well-developed and well-nourished and in no acute distress  Neck:.  The neck is nontender with good ROM   Neurologic Exam  Mental status: The patient is alert and oriented x 3 at the time of the examination. The patient has apparent normal recent and remote memory, with an  apparently normal attention span and concentration ability.   Speech is normal.  Cranial nerves: Extraocular movements are full.  Facial strength and sensation is normal. Trapezius strength is normal.. No dysarthria is noted. Mildly reduced hearing on the left.    Motor: Muscle bulk is normal.  Muscle tone is increased.  Strength is 5/5 bilaterally except for 4+/5 toe extension on the right  Sensory: She has intact touch and vibration sensation in the arms and legs.  Coordination: Finger-nose-finger and heel-to-shin is performed well..  Gait and station: The station is normal.  Gait is mildly wide and slight right foot drop.    The tandem gait is poor.  Romberg is negative.  Reflexes: Deep tendon reflexes are symmetric and normal bilaterally.      DIAGNOSTIC DATA (LABS, IMAGING, TESTING) - I reviewed patient records, labs, notes, testing and imaging myself where available.  Lab Results  Component Value Date   WBC 4.7 09/20/2022   HGB 12.8 09/20/2022   HCT 38.1 09/20/2022   MCV 85 09/20/2022   PLT 353 09/20/2022  Component Value Date/Time   NA 133 (L) 09/20/2022 1337   K 5.1 09/20/2022 1337   CL 95 (L) 09/20/2022 1337   CO2 23 09/20/2022 1337   GLUCOSE 84 09/20/2022 1337   GLUCOSE 87 01/31/2022 1342   BUN 17 09/20/2022 1337   CREATININE 0.76 09/20/2022 1337   CALCIUM 9.7 09/20/2022 1337   PROT 6.5 09/20/2022 1337   ALBUMIN 4.2 09/20/2022 1337   AST 17 09/20/2022 1337   ALT 23 09/20/2022 1337   ALKPHOS 84 09/20/2022 1337   BILITOT 0.3 09/20/2022 1337   GFRNONAA >60 01/31/2022 1342   GFRAA 83 08/24/2019 0950        ASSESSMENT AND PLAN  Multiple sclerosis (HCC)  OSA on CPAP  High risk medication use  Vitamin D deficiency  Insomnia, unspecified type   1.   Continue Aubagio for MS..  We will check liver function test.   2.   Continue medications for spasticity (valium), insomnia (Ambien), mood .  Continue Dyazide and as needed Valium for Mnire's  disease.  Potassium had been low and she is on supplements.  We will recheck electrolytes today. 3.    Use CPAP nightly    Since Ambien script just sent to mail order will send a small supply to local pharmacy.   4.     stay active and exercise as tolerated. 5.     Return in 6 months or sooner if there are new or worsening    Dawanda Mapel A. Epimenio Foot, MD, PhD 04/08/2023, 2:29 PM Certified in Neurology, Clinical Neurophysiology, Sleep Medicine, Pain Medicine and Neuroimaging  [facility] [address] [place], [place] [address] [phone]

## 2023-04-09 DIAGNOSIS — H903 Sensorineural hearing loss, bilateral: Secondary | ICD-10-CM | POA: Insufficient documentation

## 2023-04-09 LAB — COMPREHENSIVE METABOLIC PANEL
ALT: 10 IU/L (ref 0–32)
AST: 14 IU/L (ref 0–40)
Albumin: 4.4 g/dL (ref 3.8–4.8)
Alkaline Phosphatase: 76 IU/L (ref 44–121)
BUN/Creatinine Ratio: 18 (ref 12–28)
BUN: 14 mg/dL (ref 8–27)
Bilirubin Total: 0.3 mg/dL (ref 0.0–1.2)
CO2: 25 mmol/L (ref 20–29)
Calcium: 9.9 mg/dL (ref 8.7–10.3)
Chloride: 92 mmol/L — ABNORMAL LOW (ref 96–106)
Creatinine, Ser: 0.78 mg/dL (ref 0.57–1.00)
Globulin, Total: 2.2 g/dL (ref 1.5–4.5)
Glucose: 92 mg/dL (ref 70–99)
Potassium: 4.6 mmol/L (ref 3.5–5.2)
Sodium: 131 mmol/L — ABNORMAL LOW (ref 134–144)
Total Protein: 6.6 g/dL (ref 6.0–8.5)
eGFR: 80 mL/min/{1.73_m2} (ref >59)

## 2023-05-01 DIAGNOSIS — G4733 Obstructive sleep apnea (adult) (pediatric): Secondary | ICD-10-CM | POA: Diagnosis not present

## 2023-05-31 DIAGNOSIS — G4733 Obstructive sleep apnea (adult) (pediatric): Secondary | ICD-10-CM | POA: Diagnosis not present

## 2023-07-02 ENCOUNTER — Other Ambulatory Visit: Payer: Self-pay | Admitting: Neurology

## 2023-07-02 MED ORDER — ZOLPIDEM TARTRATE ER 12.5 MG PO TBCR: 12.5000 mg | EXTENDED_RELEASE_TABLET | Freq: Every evening | ORAL | 1 refills | Status: DC | PRN

## 2023-07-02 NOTE — Telephone Encounter (Signed)
Last seen on 04/08/23 Follow up scheduled on 10/09/23 Last filled on 01/08/23 #90 tablets  Rx pending to be signed

## 2023-07-02 NOTE — Telephone Encounter (Signed)
Pt is needing a refill request for her zolpidem (AMBIEN CR) 12.5 MG CR tablet sent in to the [facility] mail order.

## 2023-07-03 DIAGNOSIS — G4733 Obstructive sleep apnea (adult) (pediatric): Secondary | ICD-10-CM | POA: Diagnosis not present

## 2023-07-30 DIAGNOSIS — Z1231 Encounter for screening mammogram for malignant neoplasm of breast: Secondary | ICD-10-CM | POA: Diagnosis not present

## 2023-08-02 DIAGNOSIS — G4733 Obstructive sleep apnea (adult) (pediatric): Secondary | ICD-10-CM | POA: Diagnosis not present

## 2023-08-22 DIAGNOSIS — R922 Inconclusive mammogram: Secondary | ICD-10-CM | POA: Diagnosis not present

## 2023-08-26 ENCOUNTER — Encounter (INDEPENDENT_AMBULATORY_CARE_PROVIDER_SITE_OTHER): Payer: Medicare Other | Admitting: Ophthalmology

## 2023-08-26 DIAGNOSIS — H35341 Macular cyst, hole, or pseudohole, right eye: Secondary | ICD-10-CM | POA: Diagnosis not present

## 2023-08-26 DIAGNOSIS — H353121 Nonexudative age-related macular degeneration, left eye, early dry stage: Secondary | ICD-10-CM

## 2023-08-26 DIAGNOSIS — H43812 Vitreous degeneration, left eye: Secondary | ICD-10-CM

## 2023-08-28 DIAGNOSIS — E039 Hypothyroidism, unspecified: Secondary | ICD-10-CM | POA: Diagnosis not present

## 2023-09-02 DIAGNOSIS — G4733 Obstructive sleep apnea (adult) (pediatric): Secondary | ICD-10-CM | POA: Diagnosis not present

## 2023-09-03 ENCOUNTER — Telehealth (INDEPENDENT_AMBULATORY_CARE_PROVIDER_SITE_OTHER): Payer: Medicare Other | Admitting: Neurology

## 2023-09-03 ENCOUNTER — Telehealth: Payer: Self-pay | Admitting: Neurology

## 2023-09-03 ENCOUNTER — Encounter: Payer: Self-pay | Admitting: Neurology

## 2023-09-03 DIAGNOSIS — R42 Dizziness and giddiness: Secondary | ICD-10-CM

## 2023-09-03 DIAGNOSIS — G4700 Insomnia, unspecified: Secondary | ICD-10-CM

## 2023-09-03 DIAGNOSIS — R519 Headache, unspecified: Secondary | ICD-10-CM | POA: Insufficient documentation

## 2023-09-03 DIAGNOSIS — G35 Multiple sclerosis: Secondary | ICD-10-CM | POA: Diagnosis not present

## 2023-09-03 DIAGNOSIS — G4733 Obstructive sleep apnea (adult) (pediatric): Secondary | ICD-10-CM | POA: Diagnosis not present

## 2023-09-03 DIAGNOSIS — H8109 Meniere's disease, unspecified ear: Secondary | ICD-10-CM | POA: Diagnosis not present

## 2023-09-03 MED ORDER — LORAZEPAM 1 MG PO TABS: ORAL_TABLET | ORAL | 0 refills | Status: DC

## 2023-09-03 MED ORDER — PREGABALIN 75 MG PO CAPS: ORAL_CAPSULE | ORAL | 3 refills | Status: DC

## 2023-09-03 NOTE — Telephone Encounter (Signed)
 Pt called stating that she has been having a sharp stabbing pain in her side and it has thrown her gait off to where she is now having trouble walking. Pt would like to discuss with RN.

## 2023-09-03 NOTE — Progress Notes (Signed)
 [facility]  PATIENT: Leslie Ryan DOB: 1950/11/16  REFERRING CLINICIAN: Charlie Love HISTORY FROM: patient   REASON FOR VISIT: MS   HISTORICAL  CHIEF COMPLAINT:  Chief Complaint  Patient presents with   Multiple Sclerosis    HISTORY OF PRESENT ILLNESS:  Leslie Ryan is a 73 y.o. woman with relapsing remitting multiple sclerosis.    Virtual Visit via Video Note I connected with Lyfe Reihl NAME@ on 09/03/23 at  1:30 PM EST by a video enabled telemedicine application and verified that I am speaking with the correct person.  I discussed the limitations of evaluation and management by telemedicine and the availability of in person appointments. The patient expressed understanding and agreed to proceed.   Update 09/03/2023: She is experiencing left facial pain that started 3 days ago.  The next day, she had the onset of clumsy gait and was veering to the left for an hour or so.     She saw ophthalmology yesterday and she reports being told nothing should cause pain  She is on Aubagio  for her RRMS and tolerates it well.   She tolerates it well..  She denies exacerbation or new neurologic symptoms.   She drags the right foot at times but no falls.      She holds the bannister with stairs.   Foot drop is worse when tired.     Vision is doing worse and she will make an appt to see ophth soon.  She sees ENT (Dr. Jesus) for Meniere's and has a visit tomorrow.   She has Meniere's, much better with a daily Dyazide  daily (and valium  if spell). She just has mild vertigo at times now.     Bladder is the same with urgency and frequency.    MRIs in 2021 showed no new brain or cervical spine lesions.   However, MRI in 2014 showed new foci.    She is on Ambien  XR 12.5 for insomnia with benefit for sleep onset > maintenance    She has no hangover on Ambien  but trazodone  and other medications caused that. .   She had some depression with her husband dying  in July 2024  She is on CPAP +10 cm for OSA.      She denies excessive daytime sleepiness.  She is going to restart water  aerobics [facility].    She has lumbar DJD.     She had right hip replacement since last visit and feels gait is slightly worse   MS History:  In the mid 1980s, she presented with right optic neuritis while working at [facility] [facility] [employer]. A lumbar puncture was inconclusive but an MRI of the brain was consistent with MS a few years later. In the 1980s and early 1990s, she received multiple rounds of IV steroids for occasional exacerbations involving vision or gait. In 1994, she started Betaseron stopped quickly due to severe injection site reactions. In January 1997, after an exacerbation she was started on Avonex and continued on Avonex for the next 15 years or so. She had some issues with compliance on some occasions.   In the late 1990s, she began to have more permanent gait issues the right foot drop that slowly worsened. I saw early MRI reports from 2000 showing 2 foci at C6 and C7 on the cervical spine and predominantly periventricular foci on brain MRI.   On 02/20/2013, she had the sudden onset of vertigo that was not altered by movements. Her  gait worsened with ataxia and she felt like she was being pushed to the right as she walked. She did not note any numbness or weakness or visual changes. An MRI of the brain on 02/25/2013 showed multiple periventricular white matter lesions consistent with MS. She received 3 days of IV steroids with benefit. I personally reviewed the MRI and felt that there were 3 new lesions in the left frontal parietal out of that were not present on her prior MRI dated 08/08/2010 the foci enhanced. Due to the exacerbation and the MRI changes, she switched to Aubagio  in 2014. She tolerates it well and liver function tests have been fine.  IMAGING: MRI brain 03/06/2020 shows T2 hyperintense foci in the hemispheres and some foci in the pons. Many  of the lesions are in the periventricular white matter and some in the juxtacortical white matter, consistent with chronic demyelinating plaque associated with multiple sclerosis. There could be mild superimposed chronic microvascular ischemic change. None of the foci appear to be acute. Compared to the MRI from 06/20/2014, there is no definite changes.  MRI cervical spine 03/06/2020 shows T2 hyperintense focus in the left adjacent to T1.  This is consistent with chronic demyelinating plaque associated with multiple sclerosis.  On the sagittal images, but not clearly seen on axial images, there could be 2 additional small foci adjacent to C2-C3 and C6.      Minimal to mild spinal stenosis at C4-C5 through C6-C7 due to disc bulging, and ligamenta flava hypertrophy.  There is no nerve root compression.  REVIEW OF SYSTEMS:  Constitutional: No fevers, chills, sweats, or change in appetite Eyes: No visual changes, double vision, eye pain Ear, nose and throat: No hearing loss, ear pain, nasal congestion, sore throat Cardiovascular: No chest pain, palpitations Respiratory:  No shortness of breath at rest or with exertion.   No wheezes GastrointestinaI: No nausea, vomiting, diarrhea, abdominal pain, fecal incontinence Genitourinary:  No dysuria, urinary retention or frequency.  No nocturia. Musculoskeletal:  No neck pain, back pain Integumentary: No rash, pruritus, skin lesions Neurological: as above Psychiatric: No depression at this time.  No anxiety Endocrine: No palpitations, diaphoresis, change in appetite, change in weigh or increased thirst.   She has felt cold since thyroid  med's changed Hematologic/Lymphatic:  No anemia, purpura, petechiae. Allergic/Immunologic: No itchy/runny eyes, nasal congestion, recent allergic reactions, rashes  ALLERGIES: Allergies  Allergen Reactions   Erythromycin Other (See Comments)    Abdominal cramping   Flexeril  [Cyclobenzaprine ]     Severe constipation    Propofol  Other (See Comments)    Has trouble waking up   Morphine  And Codeine     Patient described heaviness and loss of feeling in legs coupled with a squeezing feeling in the epigastric region. Requested no further Morphine  doses.       HOME MEDICATIONS: Outpatient Medications Prior to Visit  Medication Sig Dispense Refill   Ascorbic Acid (VITAMIN C PO) Take by mouth.     aspirin  81 MG tablet Take 1 tablet (81 mg total) by mouth 2 (two) times daily. For 2 weeks then back to once a day after that for DVT prevention. 45 tablet 0   Cholecalciferol  (VITAMIN D -3) 1000 units CAPS Take 2,000 tablets by mouth daily.     diazepam  (VALIUM ) 5 MG tablet One or two po daily as needed 90 tablet 1   ELDERBERRY PO Take by mouth.     FLUoxetine  (PROZAC ) 20 MG capsule Take 1 capsule (20 mg total) by mouth daily.  90 capsule 4   lansoprazole (PREVACID) 30 MG capsule Take 30 mg by mouth daily.     levothyroxine  (SYNTHROID , LEVOTHROID) 112 MCG tablet Take 112 mcg by mouth daily.     Multiple Vitamins-Minerals (ZINC PO) Take by mouth.     olmesartan (BENICAR) 20 MG tablet Take 1 tablet by mouth daily.     potassium chloride  SA (K-DUR,KLOR-CON ) 20 MEQ tablet Take 20 mEq by mouth 2 (two) times daily.     pravastatin  (PRAVACHOL ) 20 MG tablet Take 20 mg by mouth daily.     Probiotic Product (PROBIOTIC PO) Take 1 capsule by mouth daily.     Teriflunomide  (AUBAGIO ) 14 MG TABS Take 1 tablet (14 mg total) by mouth daily. 90 tablet 3   triamterene -hydrochlorothiazide  (DYAZIDE ) 37.5-25 MG capsule Take 1 each (1 capsule total) by mouth daily. 90 capsule 3   vitamin E  400 UNIT capsule Take 400 Units by mouth daily.     zolpidem  (AMBIEN  CR) 12.5 MG CR tablet Take 1 tablet (12.5 mg total) by mouth at bedtime as needed for sleep. 90 tablet 1   No facility-administered medications prior to visit.    PAST MEDICAL HISTORY: Past Medical History:  Diagnosis Date   Anxiety    Celiac disease    Cervical dysplasia     Claustrophobia    Complication of anesthesia    slow to awaken, doesn't want Propofol - when she had it before, had to be reminded to breath.   Depression    Family history of adverse reaction to anesthesia    mother difficulty intubation   GERD (gastroesophageal reflux disease)    Hearing loss    Hypothyroidism    Multiple sclerosis (HCC)    PONV (postoperative nausea and vomiting)    Sleep apnea    C-Pap   Vision abnormalities     PAST SURGICAL HISTORY: Past Surgical History:  Procedure Laterality Date   25 GAUGE PARS PLANA VITRECTOMY WITH 20 GAUGE MVR PORT FOR MACULAR HOLE Right 05/06/2018   25 GAUGE PARS PLANA VITRECTOMY WITH 20 GAUGE MVR PORT FOR MACULAR HOLE Right 05/06/2018   Procedure: 25 GAUGE PARS PLANA VITRECTOMY WITH 20 GAUGE MVR PORT FOR MACULAR HOLE;  Surgeon: Alvia Norleen BIRCH, MD;  Location: [facility] OR;  Service: Ophthalmology;  Laterality: Right;   ABDOMINAL HYSTERECTOMY  1999   TAH,BSO menorrhgia,,endometriosis   CHOLECYSTECTOMY  1983   COLONOSCOPY     COLPOSCOPY     FINGER SURGERY Right    5 finger    GAS/FLUID EXCHANGE Right 05/06/2018   Procedure: GAS/FLUID EXCHANGE C3F8;  Surgeon: Alvia Norleen BIRCH, MD;  Location: [facility] OR;  Service: Ophthalmology;  Laterality: Right;   GYNECOLOGIC CRYOSURGERY  1987   LAPAROSCOPY  1986   MEMBRANE PEEL Right 05/06/2018   Procedure: MEMBRANE PEEL;  Surgeon: Alvia Norleen BIRCH, MD;  Location: [facility] OR;  Service: Ophthalmology;  Laterality: Right;   PHOTOCOAGULATION WITH LASER Right 05/06/2018   Procedure: PHOTOCOAGULATION WITH LASER;  Surgeon: Alvia Norleen BIRCH, MD;  Location[facility] OR;  Service: Ophthalmology;  Laterality: Right;   ROTATOR CUFF REPAIR Right 2015   TOTAL HIP ARTHROPLASTY Right 02/06/2022   Procedure: RIGHT TOTAL HIP ARTHROPLASTY ANTERIOR APPROACH;  Surgeon: Sheril Coy, MD;  Location: [facility] ORS;  Service: Orthopedics;  Laterality: Right;   VITRECTOMY      FAMILY HISTORY: Family History  Problem Relation Age of Onset    Breast cancer Mother        age 102's   Cancer Mother  UTERINE   Colon cancer Mother    Diabetes Father    Hypertension Father    Heart disease Father    Cancer Maternal Grandfather        Stomach cancer   Heart disease Paternal Grandfather     SOCIAL HISTORY:  Social History   Socioeconomic History   Marital status: Married    Spouse name: Not on file   Number of children: Not on file   Years of education: Not on file   Highest education level: Not on file  Occupational History   Not on file  Tobacco Use   Smoking status: Former    Current packs/day: 0.00    Average packs/day: 0.5 packs/day for 15.0 years (7.5 ttl pk-yrs)    Types: Cigarettes    Start date: 55    Quit date: 2008    Years since quitting: 17.0   Smokeless tobacco: Never   Tobacco comments:    05/05/18- quit at least 15 years ago  Vaping Use   Vaping status: Never Used  Substance and Sexual Activity   Alcohol  use: No   Drug use: No   Sexual activity: Not Currently    Birth control/protection: Surgical  Other Topics Concern   Not on file  Social History Narrative   Not on file   Social Drivers of Health   Financial Resource Strain: Not on file  Food Insecurity: Low Risk  (03/26/2023)   Received from [facility]   Hunger Vital Sign    Worried About Running Out of Food in the Last Year: Never true    Ran Out of Food in the Last Year: Never true  Transportation Needs: No Transportation Needs (03/26/2023)   Received from [facility]    In the past 12 months, has lack of reliable transportation kept you from medical appointments, meetings, work or from getting things needed for daily living? : No  Physical Activity: Not on file  Stress: Not on file  Social Connections: Not on file  Intimate Partner Violence: Not on file     PHYSICAL EXAM from 04/08/2023  There were no vitals filed for this visit.   There is no height or weight on file to calculate  BMI.   General: The patient is well-developed and well-nourished and in no acute distress  Neck:.  The neck is nontender with good ROM   Neurologic Exam  Mental status: The patient is alert and oriented x 3 at the time of the examination. The patient has apparent normal recent and remote memory, with an apparently normal attention span and concentration ability.   Speech is normal.  Cranial nerves: Extraocular movements are full.  Facial strength and sensation is normal. Trapezius strength is normal.. No dysarthria is noted. Mildly reduced hearing on the left.    Motor: Muscle bulk is normal.  Muscle tone is increased.  Strength is 5/5 bilaterally except for 4+/5 toe extension on the right  Sensory: She has intact touch and vibration sensation in the arms and legs.  Coordination: Finger-nose-finger and heel-to-shin is performed well..  Gait and station: The station is normal.  Gait is mildly wide and slight right foot drop.    The tandem gait is poor.  Romberg is negative.  Reflexes: Deep tendon reflexes are symmetric and normal bilaterally.      DIAGNOSTIC DATA (LABS, IMAGING, TESTING) - I reviewed patient records, labs, notes, testing and imaging myself where available.  Lab Results  Component Value Date  WBC 4.7 09/20/2022   HGB 12.8 09/20/2022   HCT 38.1 09/20/2022   MCV 85 09/20/2022   PLT 353 09/20/2022      Component Value Date/Time   NA 131 (L) 04/08/2023 1453   K 4.6 04/08/2023 1453   CL 92 (L) 04/08/2023 1453   CO2 25 04/08/2023 1453   GLUCOSE 92 04/08/2023 1453   GLUCOSE 87 01/31/2022 1342   BUN 14 04/08/2023 1453   CREATININE 0.78 04/08/2023 1453   CALCIUM 9.9 04/08/2023 1453   PROT 6.6 04/08/2023 1453   ALBUMIN 4.4 04/08/2023 1453   AST 14 04/08/2023 1453   ALT 10 04/08/2023 1453   ALKPHOS 76 04/08/2023 1453   BILITOT 0.3 04/08/2023 1453   GFRNONAA >60 01/31/2022 1342   GFRAA 83 08/24/2019 0950        ASSESSMENT AND PLAN  Multiple sclerosis  (HCC) - Plan: MR BRAIN/IAC W WO CONTRAST  OSA on CPAP  Insomnia, unspecified type  Meniere's disease, unspecified laterality  Vertigo - Plan: MR BRAIN/IAC W WO CONTRAST  Left facial pain - Plan: MR BRAIN/IAC W WO CONTRAST   1.   Continue Aubagio  for MS..  Due to her new symptoms we need to check an MRI of the brain to determine if there has been additional demyelinating plaques.  If so consider a more efficacious disease modifying therapy.  The MRI of the brain with IAC cuts were also allow us  to assess for neurovascular compression. 2.   Continue medications for spasticity (valium ), insomnia (Ambien ), mood .  Continue Dyazide  and as needed Valium  for Mnire's disease.  Potassium had been low and she is on supplements.  We will recheck electrolytes today. 3.    Use CPAP nightly    Since Ambien  script just sent to mail order will send a small supply to local pharmacy.   4.     Pregabalin  75 mg p.o. every morning and 150 mg p.o. nightly for dysesthetic sensation.  Consider lamotrigine if this is not helpful.  Because she is on a diuretic I will avoid oxcarbazepine as there could be additive effects on the sodium level 5.    stay active and exercise as tolerated. 6.     Return in 6 months or sooner if there are new or worsening   This visit is part of a comprehensive longitudinal care medical relationship regarding the patients primary diagnosis of MS and related concerns.    Follow Up Instructions: I discussed the assessment and treatment plan with the patient. The patient was provided an opportunity to ask questions and all were answered. The patient agreed with the plan and demonstrated an understanding of the instructions.    The patient was advised to call back or seek an in-person evaluation if the symptoms worsen or if the condition fails to improve as anticipated.  I provided 25 minutes of non-face-to-face time during this encounter.  Salah Burlison A. Vear, MD, PhD 09/03/2023, 8:19  PM Certified in Neurology, Clinical Neurophysiology, Sleep Medicine, Pain Medicine and Neuroimaging  [facility] [address] [place], [place] [address] [phone]

## 2023-09-03 NOTE — Telephone Encounter (Signed)
 [employer] medicare Berkley Harvey: [id number] exp. 09/03/23-10/02/23 sent to GI [phone]

## 2023-09-03 NOTE — Telephone Encounter (Signed)
 Called pt. Having sharp stabbing pain in left temple. Started a couple days ago. She was off balance due to pain. She took ambien  before bed. Pain waking her up at night. Describes as stabbing pain. Pain intermittent. Has tried Advil  prn. This is new for her. No new meds, no illness. Pain in eye sometimes. Last eye appt over a yr. Recommended she call to make updated eye appt as well. MS DMT: Aubagio . DEnies missing any doses of this.  Scheduled mychart VV for today at 1:30pm with Dr. Vear. Walked her through how to [employer] and she successfully logged on and confirmed she sees appt. Unable to do in office due to having plumber coming to house and she needs to be there.

## 2023-09-21 ENCOUNTER — Ambulatory Visit
Admission: RE | Admit: 2023-09-21 | Discharge: 2023-09-21 | Disposition: A | Discharge status: Home / Self Care | Payer: Medicare Other | Source: Ambulatory Visit | Attending: Neurology | Admitting: Neurology

## 2023-09-21 DIAGNOSIS — G35 Multiple sclerosis: Secondary | ICD-10-CM

## 2023-09-21 DIAGNOSIS — R42 Dizziness and giddiness: Secondary | ICD-10-CM

## 2023-09-21 DIAGNOSIS — R519 Headache, unspecified: Secondary | ICD-10-CM | POA: Diagnosis not present

## 2023-09-21 MED ORDER — GADOPICLENOL 0.5 MMOL/ML IV SOLN
7.5000 mL | Freq: Once | INTRAVENOUS | Status: AC | PRN
  Administered 2023-09-21: 7.5 mL via INTRAVENOUS

## 2023-09-23 ENCOUNTER — Encounter: Payer: Self-pay | Admitting: Neurology

## 2023-09-26 DIAGNOSIS — K080 Exfoliation of teeth due to systemic causes: Secondary | ICD-10-CM | POA: Diagnosis not present

## 2023-10-09 ENCOUNTER — Ambulatory Visit: Payer: Medicare Other | Admitting: Neurology

## 2023-10-16 DIAGNOSIS — G4733 Obstructive sleep apnea (adult) (pediatric): Secondary | ICD-10-CM | POA: Diagnosis not present

## 2023-10-22 IMAGING — DX DG HIP (WITH OR WITHOUT PELVIS) 2-3V*R*
3 series · 3 of 3 positions shown · non-contrast
Comparison: None Available.

CLINICAL DATA: Acute right hip pain without known injury.

EXAM:
DG HIP (WITH OR WITHOUT PELVIS) 2-3V RIGHT

[pelvis ap]
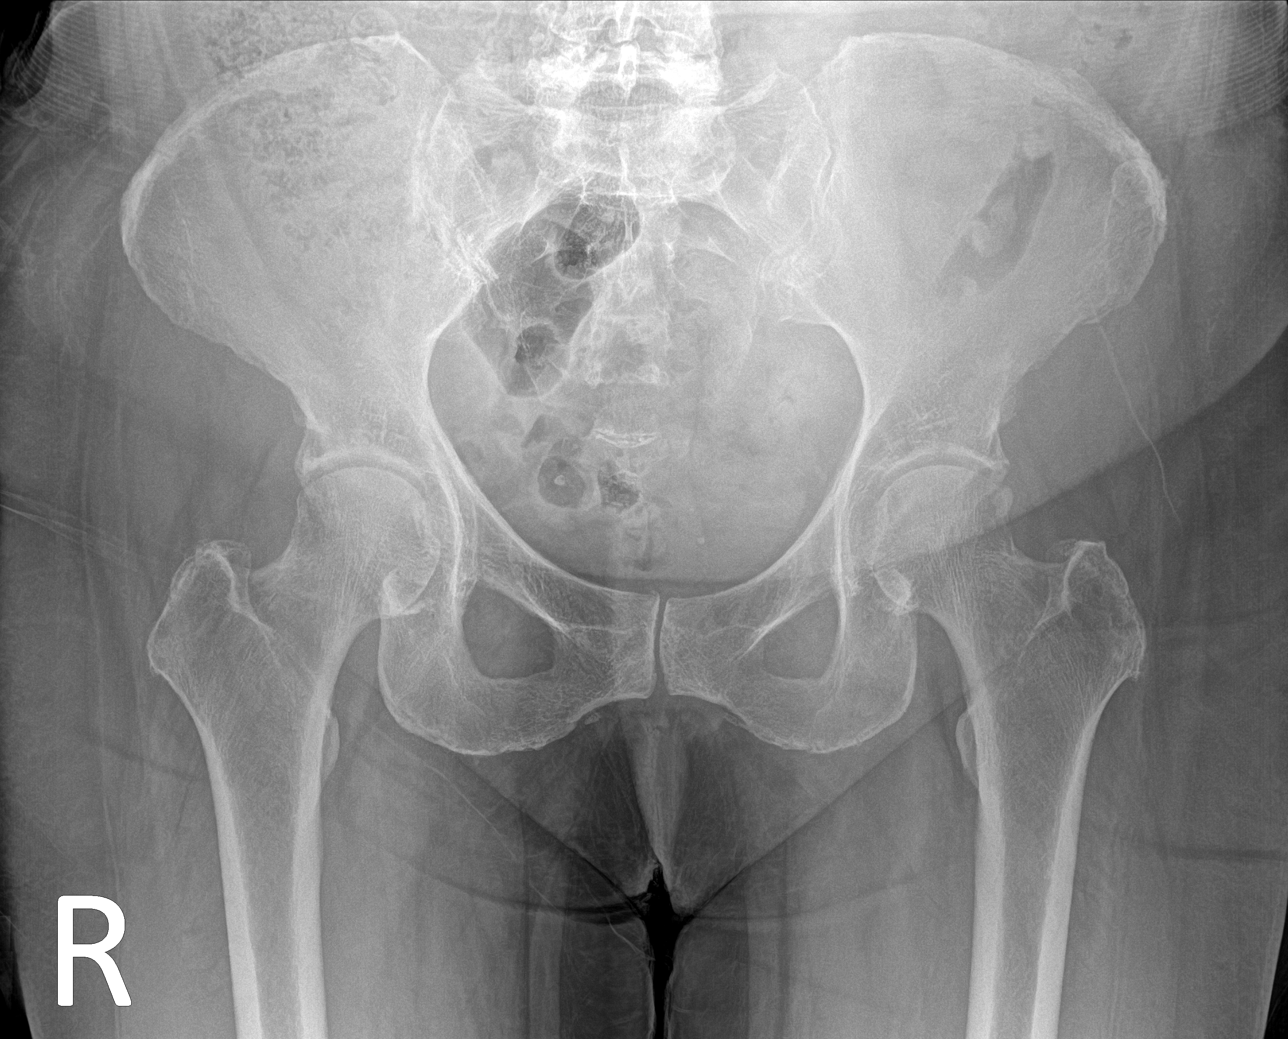

[hip ap]
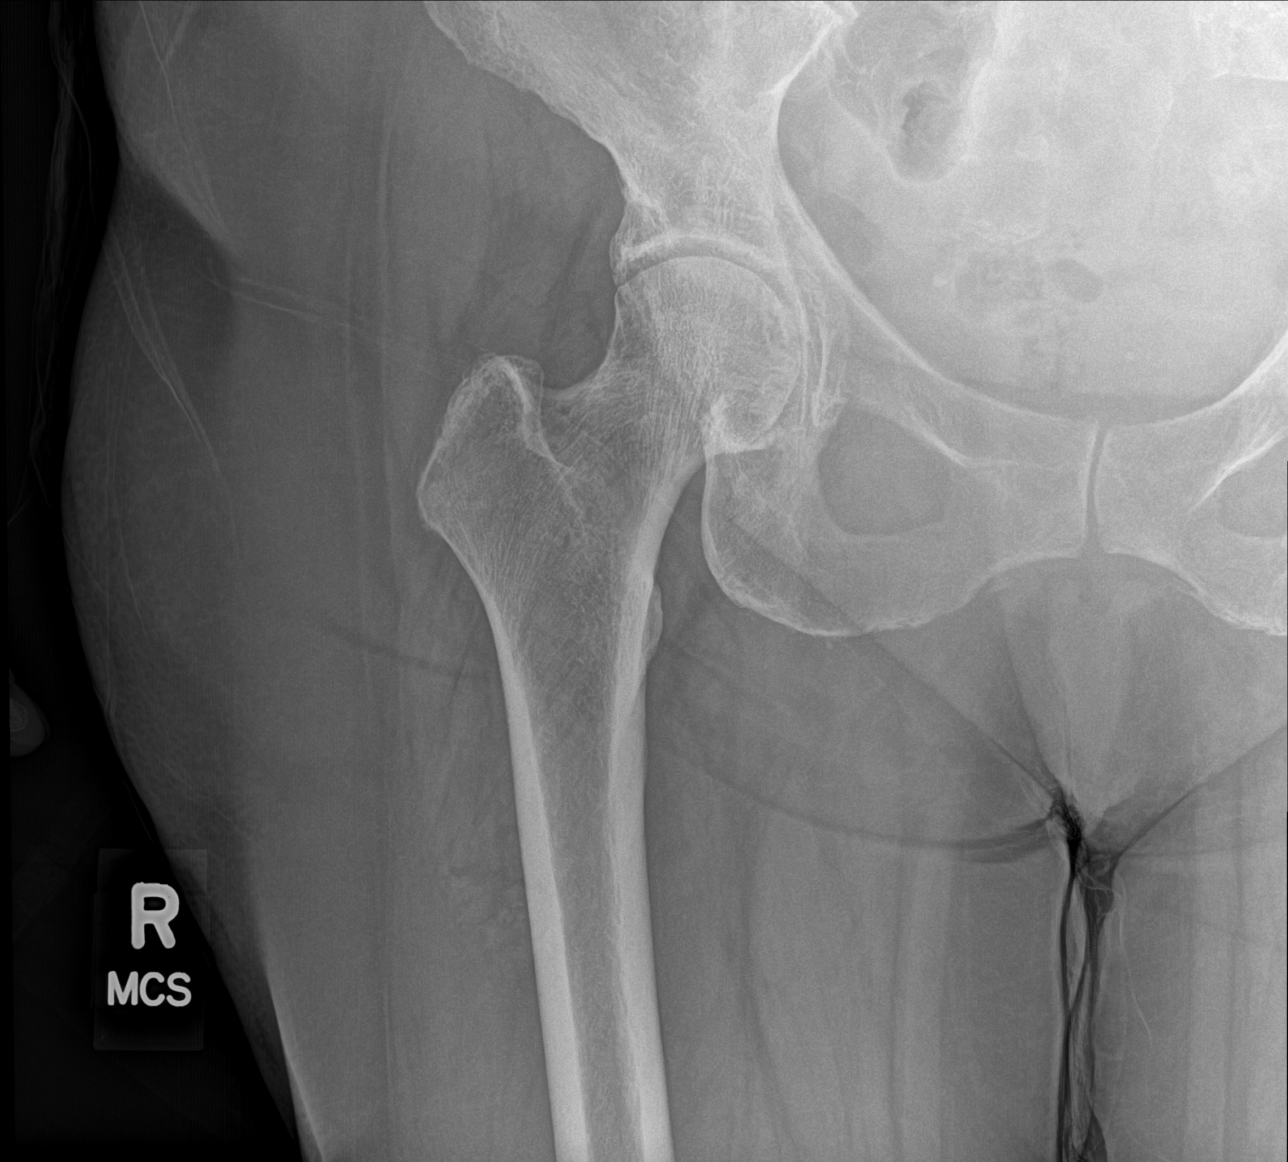

[hip lat]
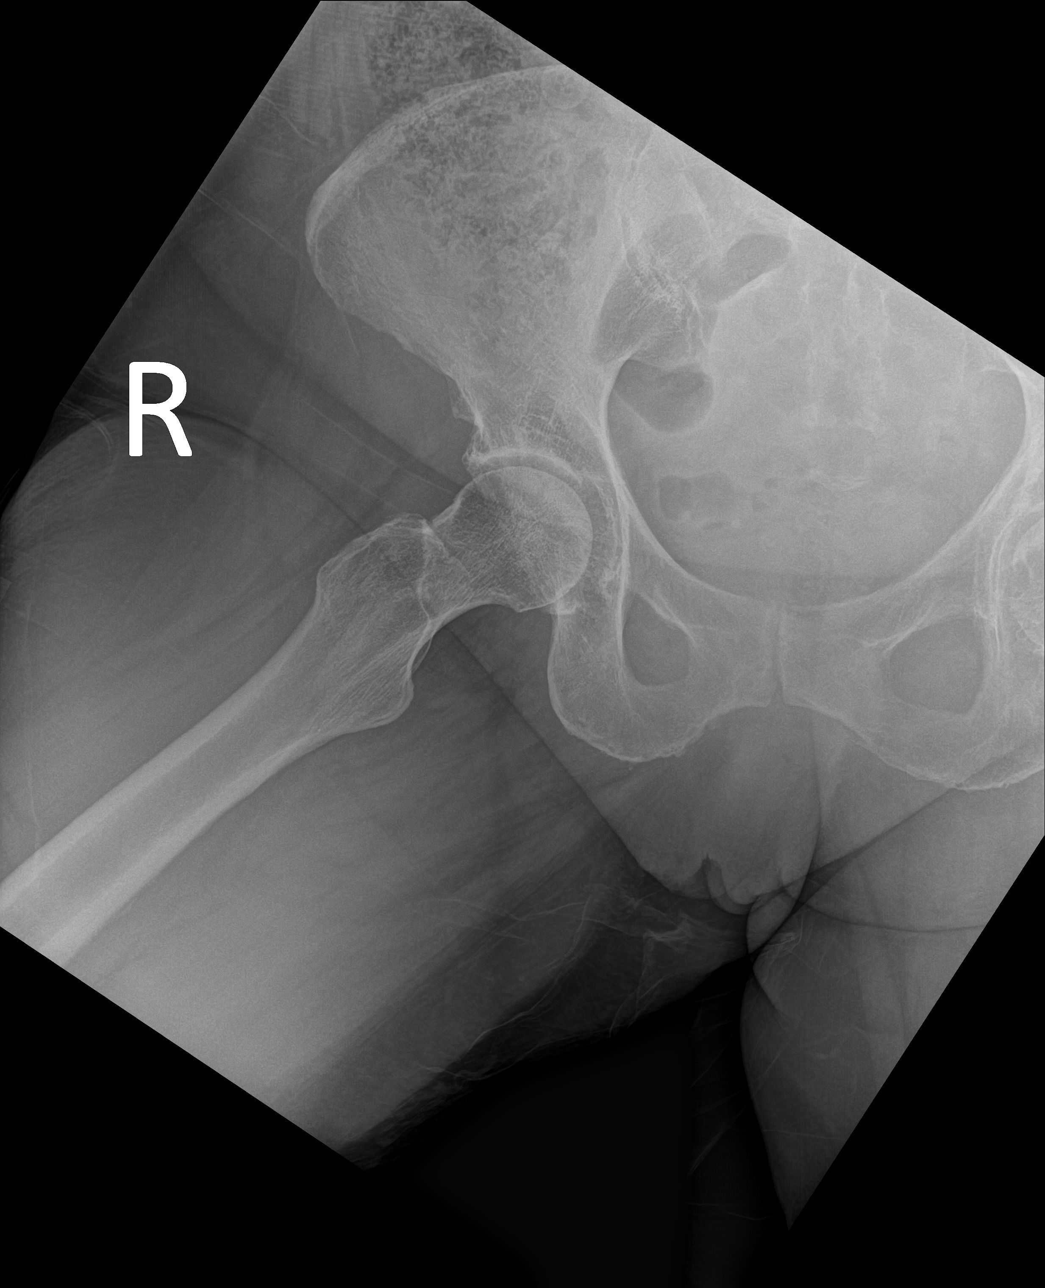

[3 of 3 positions shown; findings below may reference images not displayed]

FINDINGS: There is no evidence of hip fracture or dislocation. No joint space
narrowing is noted. Mild osteophyte formation is seen involving the
right hip.
IMPRESSION: Mild degenerative joint disease of right hip. No acute abnormality
seen.

## 2023-11-06 DIAGNOSIS — G4733 Obstructive sleep apnea (adult) (pediatric): Secondary | ICD-10-CM | POA: Diagnosis not present

## 2023-11-06 DIAGNOSIS — R531 Weakness: Secondary | ICD-10-CM | POA: Diagnosis not present

## 2023-11-19 ENCOUNTER — Ambulatory Visit (INDEPENDENT_AMBULATORY_CARE_PROVIDER_SITE_OTHER): Payer: Medicare Other | Admitting: Neurology

## 2023-11-19 ENCOUNTER — Encounter: Payer: Self-pay | Admitting: Neurology

## 2023-11-19 VITALS — BP 108/62 | HR 86 | Ht 63.0 in | Wt 173.0 lb

## 2023-11-19 DIAGNOSIS — G35 Multiple sclerosis: Secondary | ICD-10-CM

## 2023-11-19 DIAGNOSIS — H8109 Meniere's disease, unspecified ear: Secondary | ICD-10-CM | POA: Diagnosis not present

## 2023-11-19 DIAGNOSIS — Z79899 Other long term (current) drug therapy: Secondary | ICD-10-CM

## 2023-11-19 DIAGNOSIS — G4700 Insomnia, unspecified: Secondary | ICD-10-CM | POA: Diagnosis not present

## 2023-11-19 DIAGNOSIS — G4733 Obstructive sleep apnea (adult) (pediatric): Secondary | ICD-10-CM | POA: Diagnosis not present

## 2023-11-19 MED ORDER — TERIFLUNOMIDE 14 MG PO TABS: 14.0000 mg | ORAL_TABLET | Freq: Every day | ORAL | 3 refills | Status: AC

## 2023-11-19 MED ORDER — ZOLPIDEM TARTRATE ER 12.5 MG PO TBCR
12.5000 mg | EXTENDED_RELEASE_TABLET | Freq: Every evening | ORAL | 1 refills | Status: DC | PRN
Start: 2023-11-19 — End: 2024-06-03

## 2023-11-19 MED ORDER — DIAZEPAM 5 MG PO TABS
ORAL_TABLET | ORAL | 1 refills | Status: DC
Start: 2023-11-19 — End: 2023-11-19

## 2023-11-19 MED ORDER — DIAZEPAM 5 MG PO TABS: ORAL_TABLET | ORAL | 1 refills | Status: DC

## 2023-11-19 NOTE — Progress Notes (Signed)
 [facility]  PATIENT: Leslie Ryan DOB: 01-13-1951  REFERRING CLINICIAN: Geoffry Paradise HISTORY FROM: patient   REASON FOR VISIT: MS   HISTORICAL  CHIEF COMPLAINT:  Chief Complaint  Patient presents with   Follow-up    Pt in 11 alone Pt here for MS amd cpap f/u  Pt states gait off ,fatigue, and some dizziness Pt states not during well with pap machine Pt states has changes mask several times      HISTORY OF PRESENT ILLNESS:  Leslie Ryan is a 73 y.o. woman with relapsing remitting multiple sclerosis.    Update 11/19/2023: She changed DME companies when [employer] stopped covering her.  Now with [employer].   She feels she is doing worse with her CPAP lately.   She was switched to a FFM 2 weeks ago and feels worse.      She also notes being switched to a small vs medium.   She is much more tired and sleepy.     AHI was 1.9 last year but 8 at current download.     She is on CPAP +10 cm for OSA.        She is on Aubagio for her RRMS and tolerates it well.   She tolerates it well..  She denies exacerbation or new neurologic symptoms.    Gait is off balanced at times.   She drags the right foot at times but no falls.      She holds the bannister with stairs.   Foot drop is worse when tired.     No further trigeminal neuralgia.  Vision is doing worse and she will make an appt to see ophth soon.  She sees ENT (Dr. Pollyann Kennedy) for Meniere's and has a visit tomorrow.   She has Meniere's, much better with a daily Dyazide daily (and valium if spell). She just has mild vertigo at times now.     Bladder is the same with urgency and frequency.    MRIs in 2021 showed no new brain or cervical spine lesions.   However, MRI in 2014 showed new foci.    She is on Ambien XR 12.5 for insomnia with benefit for sleep onset > maintenance    She has no hangover on Ambien but trazodone and other medications caused that. .   She had some depression with her husband dying in July 2024.       She is going to restart water aerobics a [facility].    She has lumbar DJD.     She had right hip replacement since last visit and feels gait is slightly worse  EPWORTH SLEEPINESS SCALE  On a scale of 0 - 3 what is the chance of dozing:  Sitting and Reading:   1 Watching TV:    2 Sitting inactive in a public place: 0 Passenger in car for one hour: 0 Lying down to rest in the afternoon: 3 Sitting and talking to someone: 0 Sitting quietly after lunch:  0  In a car, stopped in traffic:  0  Total (out of 24):    6/24   not excessive;y sleepy    MS History:  In the mid 1980s, she presented with right optic neuritis while working at [facility] [employer] [facility] [employer] [facility] [employer] [employer]. A lumbar puncture was inconclusive but an MRI of the brain was consistent with MS a few years later. In the 1980s and early 1990s, she received multiple rounds of IV steroids for occasional exacerbations involving vision or  gait. In 1994, she started Betaseron stopped quickly due to severe injection site reactions. In January 1997, after an exacerbation she was started on Avonex and continued on Avonex for the next 15 years or so. She had some issues with compliance on some occasions.   In the late 1990s, she began to have more permanent gait issues the right foot drop that slowly worsened. I saw early MRI reports from 2000 showing 2 foci at C6 and C7 on the cervical spine and predominantly periventricular foci on brain MRI.   On 02/20/2013, she had the sudden onset of vertigo that was not altered by movements. Her gait worsened with ataxia and she felt like she was being pushed to the right as she walked. She did not note any numbness or weakness or visual changes. An MRI of the brain on 02/25/2013 showed multiple periventricular white matter lesions consistent with MS. She received 3 days of IV steroids with benefit. I personally reviewed the MRI and felt that there were 3 new lesions in the left frontal parietal out of that were not  present on her prior MRI dated 08/08/2010 the foci enhanced. Due to the exacerbation and the MRI changes, she switched to Aubagio in 2014. She tolerates it well and liver function tests have been fine.  IMAGING: MRI brain 03/06/2020 shows T2 hyperintense foci in the hemispheres and some foci in the pons. Many of the lesions are in the periventricular white matter and some in the juxtacortical white matter, consistent with chronic demyelinating plaque associated with multiple sclerosis. There could be mild superimposed chronic microvascular ischemic change. None of the foci appear to be acute. Compared to the MRI from 06/20/2014, there is no definite changes.  MRI cervical spine 03/06/2020 shows T2 hyperintense focus in the left adjacent to T1.  This is consistent with chronic demyelinating plaque associated with multiple sclerosis.  On the sagittal images, but not clearly seen on axial images, there could be 2 additional small foci adjacent to C2-C3 and C6.      Minimal to mild spinal stenosis at C4-C5 through C6-C7 due to disc bulging, and ligamenta flava hypertrophy.  There is no nerve root compression.  REVIEW OF SYSTEMS:  Constitutional: No fevers, chills, sweats, or change in appetite Eyes: No visual changes, double vision, eye pain Ear, nose and throat: No hearing loss, ear pain, nasal congestion, sore throat Cardiovascular: No chest pain, palpitations Respiratory:  No shortness of breath at rest or with exertion.   No wheezes GastrointestinaI: No nausea, vomiting, diarrhea, abdominal pain, fecal incontinence Genitourinary:  No dysuria, urinary retention or frequency.  No nocturia. Musculoskeletal:  No neck pain, back pain Integumentary: No rash, pruritus, skin lesions Neurological: as above Psychiatric: No depression at this time.  No anxiety Endocrine: No palpitations, diaphoresis, change in appetite, change in weigh or increased thirst.   She has felt cold since thyroid med's  changed Hematologic/Lymphatic:  No anemia, purpura, petechiae. Allergic/Immunologic: No itchy/runny eyes, nasal congestion, recent allergic reactions, rashes  ALLERGIES: Allergies  Allergen Reactions   Erythromycin Other (See Comments)    Abdominal cramping   Flexeril [Cyclobenzaprine]     Severe constipation   Propofol Other (See Comments)    Has trouble waking up   Morphine And Codeine     Patient described heaviness and loss of feeling in legs coupled with a squeezing feeling in the epigastric region. Requested no further Morphine doses.       HOME MEDICATIONS: Outpatient Medications Prior to Visit  Medication Sig Dispense Refill   aspirin 81 MG tablet Take 1 tablet (81 mg total) by mouth 2 (two) times daily. For 2 weeks then back to once a day after that for DVT prevention. 45 tablet 0   Cholecalciferol (VITAMIN D-3) 1000 units CAPS Take 2,000 tablets by mouth daily.     FLUoxetine (PROZAC) 20 MG capsule Take 1 capsule (20 mg total) by mouth daily. 90 capsule 4   lansoprazole (PREVACID) 30 MG capsule Take 30 mg by mouth daily.     levothyroxine (SYNTHROID, LEVOTHROID) 112 MCG tablet Take 112 mcg by mouth daily.     olmesartan (BENICAR) 20 MG tablet Take 1 tablet by mouth daily.     potassium chloride SA (K-DUR,KLOR-CON) 20 MEQ tablet Take 20 mEq by mouth 2 (two) times daily.     pravastatin (PRAVACHOL) 20 MG tablet Take 20 mg by mouth daily.     Probiotic Product (PROBIOTIC PO) Take 1 capsule by mouth daily.     SEMAGLUTIDE, 1 MG/DOSE, [place] Inject into the skin once a week.     triamterene-hydrochlorothiazide (DYAZIDE) 37.5-25 MG capsule Take 1 each (1 capsule total) by mouth daily. 90 capsule 3   vitamin E 400 UNIT capsule Take 400 Units by mouth daily.     diazepam (VALIUM) 5 MG tablet One or two po daily as needed 90 tablet 1   LORazepam (ATIVAN) 1 MG tablet Take one or two before MRI.   Must have driver 2 tablet 0   Teriflunomide (AUBAGIO) 14 MG TABS Take 1 tablet (14 mg  total) by mouth daily. 90 tablet 3   zolpidem (AMBIEN CR) 12.5 MG CR tablet Take 1 tablet (12.5 mg total) by mouth at bedtime as needed for sleep. 90 tablet 1   Ascorbic Acid (VITAMIN C PO) Take by mouth.     ELDERBERRY PO Take by mouth.     Multiple Vitamins-Minerals (ZINC PO) Take by mouth.     pregabalin (LYRICA) 75 MG capsule One po qAM and 2 po qHS 90 capsule 3   No facility-administered medications prior to visit.    PAST MEDICAL HISTORY: Past Medical History:  Diagnosis Date   Anxiety    Celiac disease    Cervical dysplasia    Claustrophobia    Complication of anesthesia    slow to awaken, doesn't want Propofol- when she had it before, had to be reminded to breath.   Depression    Family history of adverse reaction to anesthesia    mother difficulty intubation   GERD (gastroesophageal reflux disease)    Hearing loss    Hypothyroidism    Multiple sclerosis (HCC)    PONV (postoperative nausea and vomiting)    Sleep apnea    C-Pap   Vision abnormalities     PAST SURGICAL HISTORY: Past Surgical History:  Procedure Laterality Date   25 GAUGE PARS PLANA VITRECTOMY WITH 20 GAUGE MVR PORT FOR MACULAR HOLE Right 05/06/2018   25 GAUGE PARS PLANA VITRECTOMY WITH 20 GAUGE MVR PORT FOR MACULAR HOLE Right 05/06/2018   Procedure: 25 GAUGE PARS PLANA VITRECTOMY WITH 20 GAUGE MVR PORT FOR MACULAR HOLE;  Surgeon: Sherrie George, MD;  Location: [facility] OR;  Service: Ophthalmology;  Laterality: Right;   ABDOMINAL HYSTERECTOMY  1999   TAH,BSO menorrhgia,,endometriosis   CHOLECYSTECTOMY  1983   COLONOSCOPY     COLPOSCOPY     FINGER SURGERY Right    5 finger    GAS/FLUID EXCHANGE Right 05/06/2018   Procedure:  GAS/FLUID EXCHANGE C3F8;  Surgeon: Sherrie George, MD;  Location: [facility] OR;  Service: Ophthalmology;  Laterality: Right;   GYNECOLOGIC CRYOSURGERY  1987   LAPAROSCOPY  1986   MEMBRANE PEEL Right 05/06/2018   Procedure: MEMBRANE PEEL;  Surgeon: Sherrie George, MD;  Location: [facility]  OR;  Service: Ophthalmology;  Laterality: Right;   PHOTOCOAGULATION WITH LASER Right 05/06/2018   Procedure: PHOTOCOAGULATION WITH LASER;  Surgeon: Sherrie George, MD;  Location: [facility] OR;  Service: Ophthalmology;  Laterality: Right;   ROTATOR CUFF REPAIR Right 2015   TOTAL HIP ARTHROPLASTY Right 02/06/2022   Procedure: RIGHT TOTAL HIP ARTHROPLASTY ANTERIOR APPROACH;  Surgeon: Marcene Corning, MD;  Location: [facility] ORS;  Service: Orthopedics;  Laterality: Right;   VITRECTOMY      FAMILY HISTORY: Family History  Problem Relation Age of Onset   Breast cancer Mother        age 54's   Cancer Mother        UTERINE   Colon cancer Mother    Diabetes Father    Hypertension Father    Heart disease Father    Cancer Maternal Grandfather        Stomach cancer   Heart disease Paternal Grandfather    Multiple sclerosis Neg Hx    Sleep apnea Neg Hx     SOCIAL HISTORY:  Social History   Socioeconomic History   Marital status: Married    Spouse name: Not on file   Number of children: Not on file   Years of education: Not on file   Highest education level: Not on file  Occupational History   Not on file  Tobacco Use   Smoking status: Former    Current packs/day: 0.00    Average packs/day: 0.5 packs/day for 15.0 years (7.5 ttl pk-yrs)    Types: Cigarettes    Start date: 5    Quit date: 2008    Years since quitting: 17.2   Smokeless tobacco: Never   Tobacco comments:    05/05/18- quit at least 15 years ago  Vaping Use   Vaping status: Never Used  Substance and Sexual Activity   Alcohol use: No   Drug use: No   Sexual activity: Not Currently    Birth control/protection: Surgical  Other Topics Concern   Not on file  Social History Narrative   Pt lives alone (widowed)   Retired    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 : No  Physical Activity: Not on file  Stress: Not on file  Social Connections: Not on file  Intimate Partner Violence: Not on file     PHYSICAL EXAM  Vitals:   11/19/23 0938  BP: 108/62  Pulse: 86  Weight: 173 lb (78.5 kg)  Height: 5\' 3"  (1.6 m)    Body mass index is 30.65 kg/m.   General: The patient is well-developed and well-nourished and in no acute distress  Neck:.  The  neck is nontender with good ROM   Neurologic Exam  Mental status: The patient is alert and oriented x 3 at the time of the examination. The patient has apparent normal recent and remote memory, with an apparently normal attention span and concentration ability.   Speech is normal.  Cranial nerves: Extraocular movements are full.  Facial strength and sensation is normal. Trapezius strength is normal.. No dysarthria is noted. Mildly reduced hearing on the left.    Motor: Muscle bulk is normal.  Muscle tone is increased.  Strength is 5/5 bilaterally except for 4+/5 toe extension on the right  Sensory: She has intact symmetric touch and vibration sensation in the arms and legs.  Coordination: Finger-nose-finger and heel-to-shin is performed well..  Gait and station: The station is normal.  Gait is mildly wide .  There is a mild right foot drop and a wide tandem gait.  Romberg is negative.  Reflexes: Deep tendon reflexes are symmetric and normal bilaterally.      DIAGNOSTIC DATA (LABS, IMAGING, TESTING) - I reviewed patient records, labs, notes, testing and imaging myself where available.  Lab Results  Component Value Date   WBC 4.7 09/20/2022   HGB 12.8  09/20/2022   HCT 38.1 09/20/2022   MCV 85 09/20/2022   PLT 353 09/20/2022      Component Value Date/Time   NA 131 (L) 04/08/2023 1453   K 4.6 04/08/2023 1453   CL 92 (L) 04/08/2023 1453   CO2 25 04/08/2023 1453   GLUCOSE 92 04/08/2023 1453   GLUCOSE 87 01/31/2022 1342   BUN 14 04/08/2023 1453   CREATININE 0.78 04/08/2023 1453   CALCIUM 9.9 04/08/2023 1453   PROT 6.6 04/08/2023 1453   ALBUMIN 4.4 04/08/2023 1453   AST 14 04/08/2023 1453   ALT 10 04/08/2023 1453   ALKPHOS 76 04/08/2023 1453   BILITOT 0.3 04/08/2023 1453   GFRNONAA >60 01/31/2022 1342   GFRAA 83 08/24/2019 0950        ASSESSMENT AND PLAN  Multiple sclerosis (HCC) - Plan: Hepatic function panel, CBC with Differential/Platelet, Teriflunomide (AUBAGIO) 14 MG TABS  OSA on CPAP  Insomnia, unspecified type  Meniere's disease, unspecified laterality  High risk medication use - Plan: Hepatic function panel, CBC with Differential/Platelet   1.   Continue Aubagio for MS..  We will check liver function test.     We discussed that many MS patients can d/c meds aftr age 72 but she is uncomfortable with the idea of dicintinuing and would like to continue 2.   Continue medications for spasticity (valium), insomnia (Ambien), mood .  Continue Dyazide and as needed Valium for Mnire's disease.  Potassium had been low and she is on supplements.  We will recheck electrolytes today. 3.    Use CPAP nightly    We were able to get her an N30i mask from samples - hopefully either SW or M will fit.   4.     stay active and exercise as tolerated. 5.     Return in 6 months or sooner if there are new or worsening  40-minute office visit with the majority of the time spent face-to-face for history and physical, discussion/counseling and decision-making.  Additional time with record review and documentation.   This visit is part of a comprehensive longitudinal care medical relationship regarding the patients primary diagnosis of MS  and related concerns.   Deniese Oberry A. Epimenio Foot, MD, PhD 11/19/2023, 10:45 AM Certified in Neurology, Clinical Neurophysiology, Sleep Medicine,  Pain Medicine and Neuroimaging  [facility] [address] [place], [place] [address] [phone]

## 2023-11-19 NOTE — Progress Notes (Signed)
 Marland Kitchen

## 2023-11-20 ENCOUNTER — Encounter: Payer: Self-pay | Admitting: Neurology

## 2023-11-20 LAB — CBC WITH DIFFERENTIAL/PLATELET
Basophils Absolute: 0 10*3/uL (ref 0.0–0.2)
Basos: 1 %
EOS (ABSOLUTE): 0.3 10*3/uL (ref 0.0–0.4)
Eos: 5 %
Hematocrit: 37.1 % (ref 34.0–46.6)
Hemoglobin: 12.2 g/dL (ref 11.1–15.9)
Immature Grans (Abs): 0 10*3/uL (ref 0.0–0.1)
Immature Granulocytes: 0 %
Lymphocytes Absolute: 1 10*3/uL (ref 0.7–3.1)
Lymphs: 19 %
MCH: 28.8 pg (ref 26.6–33.0)
MCHC: 32.9 g/dL (ref 31.5–35.7)
MCV: 88 fL (ref 79–97)
Monocytes Absolute: 0.5 10*3/uL (ref 0.1–0.9)
Monocytes: 8 %
Neutrophils Absolute: 3.6 10*3/uL (ref 1.4–7.0)
Neutrophils: 67 %
Platelets: 277 10*3/uL (ref 150–450)
RBC: 4.24 x10E6/uL (ref 3.77–5.28)
RDW: 12.3 % (ref 11.7–15.4)
WBC: 5.3 10*3/uL (ref 3.4–10.8)

## 2023-11-20 LAB — HEPATIC FUNCTION PANEL
ALT: 14 IU/L (ref 0–32)
AST: 15 IU/L (ref 0–40)
Albumin: 4.4 g/dL (ref 3.8–4.8)
Alkaline Phosphatase: 68 IU/L (ref 44–121)
Bilirubin Total: 0.3 mg/dL (ref 0.0–1.2)
Bilirubin, Direct: 0.12 mg/dL (ref 0.00–0.40)
Total Protein: 6.6 g/dL (ref 6.0–8.5)

## 2024-01-16 DIAGNOSIS — G4733 Obstructive sleep apnea (adult) (pediatric): Secondary | ICD-10-CM | POA: Diagnosis not present

## 2024-01-29 DIAGNOSIS — E039 Hypothyroidism, unspecified: Secondary | ICD-10-CM | POA: Diagnosis not present

## 2024-01-29 DIAGNOSIS — E785 Hyperlipidemia, unspecified: Secondary | ICD-10-CM | POA: Diagnosis not present

## 2024-01-29 DIAGNOSIS — R7301 Impaired fasting glucose: Secondary | ICD-10-CM | POA: Diagnosis not present

## 2024-02-05 DIAGNOSIS — R82998 Other abnormal findings in urine: Secondary | ICD-10-CM | POA: Diagnosis not present

## 2024-02-05 DIAGNOSIS — Z0000 Encounter for general adult medical examination without abnormal findings: Secondary | ICD-10-CM | POA: Diagnosis not present

## 2024-02-05 DIAGNOSIS — Z1339 Encounter for screening examination for other mental health and behavioral disorders: Secondary | ICD-10-CM | POA: Diagnosis not present

## 2024-02-05 DIAGNOSIS — G35 Multiple sclerosis: Secondary | ICD-10-CM | POA: Diagnosis not present

## 2024-02-05 DIAGNOSIS — Z1331 Encounter for screening for depression: Secondary | ICD-10-CM | POA: Diagnosis not present

## 2024-02-16 DIAGNOSIS — G4733 Obstructive sleep apnea (adult) (pediatric): Secondary | ICD-10-CM | POA: Diagnosis not present

## 2024-03-05 DIAGNOSIS — G4733 Obstructive sleep apnea (adult) (pediatric): Secondary | ICD-10-CM | POA: Diagnosis not present

## 2024-03-17 DIAGNOSIS — G4733 Obstructive sleep apnea (adult) (pediatric): Secondary | ICD-10-CM | POA: Diagnosis not present

## 2024-03-23 DIAGNOSIS — K080 Exfoliation of teeth due to systemic causes: Secondary | ICD-10-CM | POA: Diagnosis not present

## 2024-04-10 ENCOUNTER — Other Ambulatory Visit: Payer: Self-pay | Admitting: Neurology

## 2024-06-02 NOTE — Progress Notes (Unsigned)
 No chief complaint on file.   HISTORY OF PRESENT ILLNESS:  06/02/24 ALL:  Leslie Ryan is a 73 y.o. female here today for follow up for RRMS. She continues Aubagio  and tolerating well. MRI brain stable 02/2020, cervical imaging showed possibility of 2 new lesions adjacent to C2-C3 and C6. Labs have been stable.   She feels that she is doing well with her MS. She denies changes in her gait or balance, changes in bowel or bladder, or vision. She is followed by Dr Alvia, ophthalmology for macular degeneration. She denies any new or worsening muscle weakness. She remains on Diazepam  5-10mg  daily as needed for spasms. 90 tablets, last filled on 08/08/2022. She continues to have occasional foot drop on the left loot with prolonged ambulation.   She remains on Dyazide  37.5-25 MG capsule for the Mnire's. Potassium has been low and Dr Vear advised she increase to 40mg  of potassium. Mood is stable on fluoxetine  20mg  daily.   She is sleeping well. Ambien  CR 12.5mg  daily at bedtime helps initiate sleep without feeling poorly the next morning. Last filled for 90 tablets 02/2022. New rx sent to pharmacy 05/2022.    She continues vitamin D  2000iu daily for vitamin deficiency.  CPAP: She continues to do well. She was sick with a viral illness and this has caused lower compliance but she uses CPAP most every night for about 6-7 hours. She is needing new DME, previously with [facility].     HISTORY (copied from Dr Duncan previous note)  Leslie Ryan is a 73 y.o. woman with relapsing remitting multiple sclerosis.     Update 11/19/2023: She changed DME companies when [facility] stopped covering her.  Now with [facility].   She feels she is doing worse with her CPAP lately.   She was switched to a FFM 2 weeks ago and feels worse.      She also notes being switched to a small vs medium.   She is much more tired and sleepy.     AHI was 1.9 last year but 8 at current download.     She is on CPAP +10 cm  for OSA.         She is on Aubagio  for her RRMS and tolerates it well.   She tolerates it well..  She denies exacerbation or new neurologic symptoms.     Gait is off balanced at times.   She drags the right foot at times but no falls.      She holds the bannister with stairs.   Foot drop is worse when tired.     No further trigeminal neuralgia.   Vision is doing worse and she will make an appt to see ophth soon.  She sees ENT (Dr. Jesus) for Meniere's and has a visit tomorrow.   She has Meniere's, much better with a daily Dyazide  daily (and valium  if spell). She just has mild vertigo at times now.     Bladder is the same with urgency and frequency.     MRIs in 2021 showed no new brain or cervical spine lesions.   However, MRI in 2014 showed new foci.     She is on Ambien  XR 12.5 for insomnia with benefit for sleep onset > maintenance    She has no hangover on Ambien  but trazodone  and other medications caused that. .    She had some depression with her husband dying in July 2024.       She  is going to restart water  aerobics a [facility].    She has lumbar DJD.     She had right hip replacement since last visit and feels gait is slightly worse   EPWORTH SLEEPINESS SCALE   On a scale of 0 - 3 what is the chance of dozing:   Sitting and Reading:                           1 Watching TV:                                      2 Sitting inactive in a public place:        0 Passenger in car for one hour:           0 Lying down to rest in the afternoon:   3 Sitting and talking to someone:          0 Sitting quietly after lunch:                   0           In a car, stopped in traffic:                  0   Total (out of 24):    6/24   not excessive;y sleepy   MS History:  In the mid 1980s, she presented with right optic neuritis while working at [employer]. A lumbar puncture was inconclusive but an MRI of the brain was consistent with MS a few years later. In the 1980s and  early 1990s, she received multiple rounds of IV steroids for occasional exacerbations involving vision or gait. In 1994, she started Betaseron stopped quickly due to severe injection site reactions. In January 1997, after an exacerbation she was started on Avonex and continued on Avonex for the next 15 years or so. She had some issues with compliance on some occasions.   In the late 1990s, she began to have more permanent gait issues the right foot drop that slowly worsened. I saw early MRI reports from 2000 showing 2 foci at C6 and C7 on the cervical spine and predominantly periventricular foci on brain MRI.   On 02/20/2013, she had the sudden onset of vertigo that was not altered by movements. Her gait worsened with ataxia and she felt like she was being pushed to the right as she walked. She did not note any numbness or weakness or visual changes. An MRI of the brain on 02/25/2013 showed multiple periventricular white matter lesions consistent with MS. She received 3 days of IV steroids with benefit. I personally reviewed the MRI and felt that there were 3 new lesions in the left frontal parietal out of that were not present on her prior MRI dated 08/08/2010 the foci enhanced. Due to the exacerbation and the MRI changes, she switched to Aubagio  in 2014. She tolerates it well and liver function tests have been fine.   IMAGING: MRI brain 03/06/2020 shows T2 hyperintense foci in the hemispheres and some foci in the pons. Many of the lesions are in the periventricular white matter and some in the juxtacortical white matter, consistent with chronic demyelinating plaque associated with multiple sclerosis. There could be mild superimposed chronic microvascular ischemic change. None of the foci appear to be acute. Compared to  the MRI from 06/20/2014, there is no definite changes.   MRI cervical spine 03/06/2020 shows T2 hyperintense focus in the left adjacent to T1.  This is consistent with chronic demyelinating plaque  associated with multiple sclerosis.  On the sagittal images, but not clearly seen on axial images, there could be 2 additional small foci adjacent to C2-C3 and C6.      Minimal to mild spinal stenosis at C4-C5 through C6-C7 due to disc bulging, and ligamenta flava hypertrophy.  There is no nerve root compression.   REVIEW OF SYSTEMS: Out of a complete 14 system review of symptoms, the patient complains only of the following symptoms, See HPI and all other reviewed systems are negative.  ESS: 2/24  ALLERGIES: Allergies  Allergen Reactions   Erythromycin Other (See Comments)    Abdominal cramping   Flexeril  [Cyclobenzaprine ]     Severe constipation   Propofol  Other (See Comments)    Has trouble waking up   Morphine  And Codeine     Patient described heaviness and loss of feeling in legs coupled with a squeezing feeling in the epigastric region. Requested no further Morphine  doses.        HOME MEDICATIONS: Outpatient Medications Prior to Visit  Medication Sig Dispense Refill   aspirin  81 MG tablet Take 1 tablet (81 mg total) by mouth 2 (two) times daily. For 2 weeks then back to once a day after that for DVT prevention. 45 tablet 0   Cholecalciferol  (VITAMIN D -3) 1000 units CAPS Take 2,000 tablets by mouth daily.     diazepam  (VALIUM ) 5 MG tablet One or two po daily as needed 90 tablet 1   FLUoxetine  (PROZAC ) 20 MG capsule Take 1 capsule (20 mg total) by mouth daily. 90 capsule 4   lansoprazole (PREVACID) 30 MG capsule Take 30 mg by mouth daily.     levothyroxine  (SYNTHROID , LEVOTHROID) 112 MCG tablet Take 112 mcg by mouth daily.     olmesartan (BENICAR) 20 MG tablet Take 1 tablet by mouth daily.     potassium chloride  SA (K-DUR,KLOR-CON ) 20 MEQ tablet Take 20 mEq by mouth 2 (two) times daily.     pravastatin  (PRAVACHOL ) 20 MG tablet Take 20 mg by mouth daily.     Probiotic Product (PROBIOTIC PO) Take 1 capsule by mouth daily.     SEMAGLUTIDE, 1 MG/DOSE, [place] Inject into the skin once a  week.     Teriflunomide  (AUBAGIO ) 14 MG TABS Take 1 tablet (14 mg total) by mouth daily. 90 tablet 3   triamterene -hydrochlorothiazide  (DYAZIDE ) 37.5-25 MG capsule Take 1 each (1 capsule total) by mouth daily. 90 capsule 3   vitamin E  400 UNIT capsule Take 400 Units by mouth daily.     zolpidem  (AMBIEN  CR) 12.5 MG CR tablet Take 1 tablet (12.5 mg total) by mouth at bedtime as needed for sleep. 90 tablet 1   No facility-administered medications prior to visit.     PAST MEDICAL HISTORY: Past Medical History:  Diagnosis Date   Anxiety    Celiac disease    Cervical dysplasia    Claustrophobia    Complication of anesthesia    slow to awaken, doesn't want Propofol - when she had it before, had to be reminded to breath.   Depression    Family history of adverse reaction to anesthesia    mother difficulty intubation   GERD (gastroesophageal reflux disease)    Hearing loss    Hypothyroidism    Multiple sclerosis    PONV (  postoperative nausea and vomiting)    Sleep apnea    C-Pap   Vision abnormalities      PAST SURGICAL HISTORY: Past Surgical History:  Procedure Laterality Date   25 GAUGE PARS PLANA VITRECTOMY WITH 20 GAUGE MVR PORT FOR MACULAR HOLE Right 05/06/2018   25 GAUGE PARS PLANA VITRECTOMY WITH 20 GAUGE MVR PORT FOR MACULAR HOLE Right 05/06/2018   Procedure: 25 GAUGE PARS PLANA VITRECTOMY WITH 20 GAUGE MVR PORT FOR MACULAR HOLE;  Surgeon: Alvia Norleen BIRCH, MD;  Location: [facility] OR;  Service: Ophthalmology;  Laterality: Right;   ABDOMINAL HYSTERECTOMY  1999   TAH,BSO menorrhgia,,endometriosis   CHOLECYSTECTOMY  1983   COLONOSCOPY     COLPOSCOPY     FINGER SURGERY Right    5 finger    GAS/FLUID EXCHANGE Right 05/06/2018   Procedure: GAS/FLUID EXCHANGE C3F8;  Surgeon: Alvia Norleen BIRCH, MD;  Location: [facility] OR;  Service: Ophthalmology;  Laterality: Right;   GYNECOLOGIC CRYOSURGERY  1987   LAPAROSCOPY  1986   MEMBRANE PEEL Right 05/06/2018   Procedure: MEMBRANE PEEL;  Surgeon:  Alvia Norleen BIRCH, MD;  Location: [facility] OR;  Service: Ophthalmology;  Laterality: Right;   PHOTOCOAGULATION WITH LASER Right 05/06/2018   Procedure: PHOTOCOAGULATION WITH LASER;  Surgeon: Alvia Norleen BIRCH, MD;  Location: [facility] OR;  Service: Ophthalmology;  Laterality: Right;   ROTATOR CUFF REPAIR Right 2015   TOTAL HIP ARTHROPLASTY Right 02/06/2022   Procedure: RIGHT TOTAL HIP ARTHROPLASTY ANTERIOR APPROACH;  Surgeon: Sheril Coy, MD;  Location: [facility] ORS;  Service: Orthopedics;  Laterality: Right;   VITRECTOMY       FAMILY HISTORY: Family History  Problem Relation Age of Onset   Breast cancer Mother        age 82's   Cancer Mother        UTERINE   Colon cancer Mother    Diabetes Father    Hypertension Father    Heart disease Father    Cancer Maternal Grandfather        Stomach cancer   Heart disease Paternal Grandfather    Multiple sclerosis Neg Hx    Sleep apnea Neg Hx      SOCIAL HISTORY: Social History   Socioeconomic History   Marital status: Married    Spouse name: Not on file   Number of children: Not on file   Years of education: Not on file   Highest education level: Not on file  Occupational History   Not on file  Tobacco Use   Smoking status: Former    Current packs/day: 0.00    Average packs/day: 0.5 packs/day for 15.0 years (7.5 ttl pk-yrs)    Types: Cigarettes    Start date: 80    Quit date: 2008    Years since quitting: 17.7   Smokeless tobacco: Never   Tobacco comments:    05/05/18- quit at least 15 years ago  Vaping Use   Vaping status: Never Used  Substance and Sexual Activity   Alcohol  use: No   Drug use: No   Sexual activity: Not Currently    Birth control/protection: Surgical  Other Topics Concern   Not on file  Social History Narrative   Pt lives alone (widowed)   Retired    Social Drivers of Corporate investment banker Strain: Not on file  Food Insecurity: Low Risk  (03/26/2023)   Received from [facility]   Hunger Vital Sign     Within the past 12 months, you worried that  your food would run out before you got money to buy more: Never true    Within the past 12 months, the food you bought just didn't last and you didn't have money to get more. : Never true  Transportation Needs: No Transportation Needs (03/26/2023)   Received from [facility]    In the past 12 months, has lack of reliable transportation kept you from medical appointments, meetings, work or from getting things needed for daily living? : No  Physical Activity: Not on file  Stress: Not on file  Social Connections: Not on file  Intimate Partner Violence: Not on file     PHYSICAL EXAM  There were no vitals filed for this visit.   There is no height or weight on file to calculate BMI.  Generalized: Well developed, in no acute distress  Cardiology: normal rate and rhythm, no murmur auscultated  Respiratory: clear to auscultation bilaterally    Neurological examination  Mentation: Alert oriented to time, place, history taking. Follows all commands speech and language fluent Cranial nerve II-XII: Pupils were equal round reactive to light. Extraocular movements were full, visual field were full on confrontational test. Facial sensation and strength were normal. Head turning and shoulder shrug  were normal and symmetric. Motor: The motor testing reveals 5 over 5 strength of bilateral upper and right lower extremities. 4 over 5 strength on the left lower extremity. Good symmetric motor tone is noted throughout.  Sensory: Sensory testing is intact to soft touch on all 4 extremities. No evidence of extinction is noted.  Coordination: Cerebellar testing reveals good finger-nose-finger and heel-to-shin bilaterally.  Gait and station: Gait is mildly wide, stable with no assistive device. Patient refuses tandem stating she can not do it.  Reflexes: Deep tendon reflexes are symmetric and normal bilaterally.    DIAGNOSTIC DATA (LABS, IMAGING,  TESTING) - I reviewed patient records, labs, notes, testing and imaging myself where available.  Lab Results  Component Value Date   WBC 5.3 11/19/2023   HGB 12.2 11/19/2023   HCT 37.1 11/19/2023   MCV 88 11/19/2023   PLT 277 11/19/2023      Component Value Date/Time   NA 131 (L) 04/08/2023 1453   K 4.6 04/08/2023 1453   CL 92 (L) 04/08/2023 1453   CO2 25 04/08/2023 1453   GLUCOSE 92 04/08/2023 1453   GLUCOSE 87 01/31/2022 1342   BUN 14 04/08/2023 1453   CREATININE 0.78 04/08/2023 1453   CALCIUM 9.9 04/08/2023 1453   PROT 6.6 11/19/2023 1048   ALBUMIN 4.4 11/19/2023 1048   AST 15 11/19/2023 1048   ALT 14 11/19/2023 1048   ALKPHOS 68 11/19/2023 1048   BILITOT 0.3 11/19/2023 1048   GFRNONAA >60 01/31/2022 1342   GFRAA 83 08/24/2019 0950   No results found for: CHOL, HDL, LDLCALC, LDLDIRECT, TRIG, CHOLHDL No results found for: YHAJ8R No results found for: VITAMINB12 Lab Results  Component Value Date   TSH 0.042 (L) 06/21/2014        No data to display               No data to display           ASSESSMENT AND PLAN  73 y.o. year old female  has a past medical history of Anxiety, Celiac disease, Cervical dysplasia, Claustrophobia, Complication of anesthesia, Depression, Family history of adverse reaction to anesthesia, GERD (gastroesophageal reflux disease), Hearing loss, Hypothyroidism, Multiple sclerosis, PONV (postoperative nausea and vomiting), Sleep apnea,  and Vision abnormalities. here with    No diagnosis found.   Patient is stable on current MS regimen, we will continue Aubagio  14 mg by mouth daily. Continue Triamterene -hydrochlorothiazide  for the Mnire's. Continue diazepam  5-10mg  daily PRN for muscle spasms. Continue Ambien  XR 12.5 for insomnia and Vitamin D -3 1000, 2 capsules by mouth. PDMP appropriate. She will call when refills are needed. We will update labs, today. MRI declined. She was encouraged to continue using the CPAP  daily and greater than 4 hours. We will send transfer of care orders to [facility] for DME needs. Follow up with Dr. Vear in 6 months or sooner if needed.    No orders of the defined types were placed in this encounter.    No orders of the defined types were placed in this encounter.     Greig Forbes, MSN, FNP-C 06/02/2024, 12:15 PM  [facility] [address] [place], [place] [address] [phone]

## 2024-06-02 NOTE — Patient Instructions (Signed)

## 2024-06-03 ENCOUNTER — Ambulatory Visit (INDEPENDENT_AMBULATORY_CARE_PROVIDER_SITE_OTHER): Admitting: Family Medicine

## 2024-06-03 ENCOUNTER — Encounter: Payer: Self-pay | Admitting: Family Medicine

## 2024-06-03 VITALS — BP 137/81 | HR 72 | Ht 63.0 in | Wt 170.5 lb

## 2024-06-03 DIAGNOSIS — R42 Dizziness and giddiness: Secondary | ICD-10-CM

## 2024-06-03 DIAGNOSIS — G35D Multiple sclerosis, unspecified: Secondary | ICD-10-CM

## 2024-06-03 DIAGNOSIS — Z79899 Other long term (current) drug therapy: Secondary | ICD-10-CM

## 2024-06-03 DIAGNOSIS — G4700 Insomnia, unspecified: Secondary | ICD-10-CM

## 2024-06-03 DIAGNOSIS — E559 Vitamin D deficiency, unspecified: Secondary | ICD-10-CM

## 2024-06-03 DIAGNOSIS — M159 Polyosteoarthritis, unspecified: Secondary | ICD-10-CM | POA: Insufficient documentation

## 2024-06-03 DIAGNOSIS — L719 Rosacea, unspecified: Secondary | ICD-10-CM | POA: Insufficient documentation

## 2024-06-03 DIAGNOSIS — G4733 Obstructive sleep apnea (adult) (pediatric): Secondary | ICD-10-CM | POA: Diagnosis not present

## 2024-06-03 DIAGNOSIS — Z96641 Presence of right artificial hip joint: Secondary | ICD-10-CM | POA: Insufficient documentation

## 2024-06-03 DIAGNOSIS — R7301 Impaired fasting glucose: Secondary | ICD-10-CM | POA: Insufficient documentation

## 2024-06-03 MED ORDER — DIAZEPAM 5 MG PO TABS: ORAL_TABLET | ORAL | 1 refills | Status: AC

## 2024-06-03 MED ORDER — TRIAMTERENE-HCTZ 37.5-25 MG PO CAPS: 1.0000 | ORAL_CAPSULE | Freq: Every day | ORAL | 3 refills | Status: AC

## 2024-06-03 MED ORDER — ZOLPIDEM TARTRATE ER 12.5 MG PO TBCR: 12.5000 mg | EXTENDED_RELEASE_TABLET | Freq: Every evening | ORAL | 1 refills | Status: AC | PRN

## 2024-07-03 DIAGNOSIS — G4733 Obstructive sleep apnea (adult) (pediatric): Secondary | ICD-10-CM | POA: Diagnosis not present

## 2024-07-03 DIAGNOSIS — G35D Multiple sclerosis, unspecified: Secondary | ICD-10-CM | POA: Diagnosis not present

## 2024-07-03 DIAGNOSIS — F32A Depression, unspecified: Secondary | ICD-10-CM | POA: Diagnosis not present

## 2024-07-15 DIAGNOSIS — F419 Anxiety disorder, unspecified: Secondary | ICD-10-CM | POA: Diagnosis not present

## 2024-09-07 ENCOUNTER — Encounter (INDEPENDENT_AMBULATORY_CARE_PROVIDER_SITE_OTHER): Payer: Medicare Other | Admitting: Ophthalmology

## 2024-09-07 DIAGNOSIS — H35341 Macular cyst, hole, or pseudohole, right eye: Secondary | ICD-10-CM | POA: Diagnosis not present

## 2024-09-07 DIAGNOSIS — H43812 Vitreous degeneration, left eye: Secondary | ICD-10-CM

## 2024-09-07 DIAGNOSIS — H353131 Nonexudative age-related macular degeneration, bilateral, early dry stage: Secondary | ICD-10-CM

## 2025-01-07 ENCOUNTER — Ambulatory Visit: Admitting: Neurology

## 2025-09-08 ENCOUNTER — Encounter (INDEPENDENT_AMBULATORY_CARE_PROVIDER_SITE_OTHER): Admitting: Ophthalmology
